# Patient Record
Sex: Male | Born: 1955 | Race: Black or African American | Hispanic: No | Marital: Single | Smoking: Current every day smoker
Health system: Southern US, Community
[De-identification: ages and names within clinical notes are randomized; demographics above are authoritative.]

## PROBLEM LIST (undated history)

## (undated) DIAGNOSIS — I10 Essential (primary) hypertension: Secondary | ICD-10-CM

## (undated) DIAGNOSIS — A0472 Enterocolitis due to Clostridium difficile, not specified as recurrent: Secondary | ICD-10-CM

## (undated) DIAGNOSIS — B009 Herpesviral infection, unspecified: Secondary | ICD-10-CM

## (undated) DIAGNOSIS — E119 Type 2 diabetes mellitus without complications: Secondary | ICD-10-CM

## (undated) DIAGNOSIS — D649 Anemia, unspecified: Secondary | ICD-10-CM

## (undated) DIAGNOSIS — E785 Hyperlipidemia, unspecified: Secondary | ICD-10-CM

## (undated) DIAGNOSIS — F209 Schizophrenia, unspecified: Secondary | ICD-10-CM

## (undated) DIAGNOSIS — K219 Gastro-esophageal reflux disease without esophagitis: Secondary | ICD-10-CM

## (undated) HISTORY — PX: CHOLECYSTECTOMY: SHX55

---

## 2005-04-21 ENCOUNTER — Emergency Department: Payer: Self-pay | Admitting: Emergency Medicine

## 2005-04-21 ENCOUNTER — Other Ambulatory Visit: Payer: Self-pay

## 2005-10-24 ENCOUNTER — Ambulatory Visit: Payer: Self-pay | Admitting: Cardiovascular Disease

## 2005-10-26 ENCOUNTER — Ambulatory Visit: Payer: Self-pay | Admitting: Cardiovascular Disease

## 2006-02-18 ENCOUNTER — Emergency Department: Payer: Self-pay | Admitting: Emergency Medicine

## 2006-04-09 ENCOUNTER — Emergency Department: Payer: Self-pay | Admitting: Emergency Medicine

## 2006-06-10 ENCOUNTER — Emergency Department: Payer: Self-pay

## 2006-07-12 ENCOUNTER — Emergency Department: Payer: Self-pay | Admitting: Unknown Physician Specialty

## 2007-08-05 ENCOUNTER — Emergency Department: Payer: Self-pay | Admitting: Emergency Medicine

## 2007-12-04 ENCOUNTER — Emergency Department: Payer: Self-pay | Admitting: Emergency Medicine

## 2008-01-03 ENCOUNTER — Inpatient Hospital Stay: Payer: Self-pay | Admitting: Psychiatry

## 2008-01-03 ENCOUNTER — Other Ambulatory Visit: Payer: Self-pay

## 2008-01-22 ENCOUNTER — Ambulatory Visit: Payer: Self-pay | Admitting: Internal Medicine

## 2008-01-22 DIAGNOSIS — K219 Gastro-esophageal reflux disease without esophagitis: Secondary | ICD-10-CM | POA: Insufficient documentation

## 2008-01-22 DIAGNOSIS — J42 Unspecified chronic bronchitis: Secondary | ICD-10-CM | POA: Insufficient documentation

## 2008-01-22 DIAGNOSIS — E785 Hyperlipidemia, unspecified: Secondary | ICD-10-CM

## 2008-01-22 DIAGNOSIS — I252 Old myocardial infarction: Secondary | ICD-10-CM | POA: Insufficient documentation

## 2008-01-22 DIAGNOSIS — F411 Generalized anxiety disorder: Secondary | ICD-10-CM | POA: Insufficient documentation

## 2008-01-22 DIAGNOSIS — F209 Schizophrenia, unspecified: Secondary | ICD-10-CM | POA: Insufficient documentation

## 2008-01-22 DIAGNOSIS — F329 Major depressive disorder, single episode, unspecified: Secondary | ICD-10-CM

## 2008-01-22 DIAGNOSIS — I10 Essential (primary) hypertension: Secondary | ICD-10-CM

## 2008-01-22 LAB — CONVERTED CEMR LAB: Blood Glucose, Fingerstick: 73

## 2008-02-04 ENCOUNTER — Encounter (INDEPENDENT_AMBULATORY_CARE_PROVIDER_SITE_OTHER): Payer: Self-pay | Admitting: Internal Medicine

## 2008-02-19 ENCOUNTER — Ambulatory Visit: Payer: Self-pay | Admitting: Internal Medicine

## 2008-02-19 ENCOUNTER — Telehealth (INDEPENDENT_AMBULATORY_CARE_PROVIDER_SITE_OTHER): Payer: Self-pay | Admitting: *Deleted

## 2008-02-19 LAB — CONVERTED CEMR LAB: Hgb A1c MFr Bld: 8.9 %

## 2008-03-17 ENCOUNTER — Ambulatory Visit: Payer: Self-pay | Admitting: Internal Medicine

## 2008-03-18 LAB — CONVERTED CEMR LAB
Albumin: 3.8 g/dL (ref 3.5–5.2)
Alkaline Phosphatase: 64 units/L (ref 39–117)
BUN: 22 mg/dL (ref 6–23)
Chloride: 96 meq/L (ref 96–112)
Cholesterol: 126 mg/dL (ref 0–200)
Creatinine, Ser: 1.23 mg/dL (ref 0.40–1.50)
Glucose, Bld: 389 mg/dL — ABNORMAL HIGH (ref 70–99)
LDL Cholesterol: 65 mg/dL (ref 0–99)
Potassium: 4.9 meq/L (ref 3.5–5.3)
Sodium: 130 meq/L — ABNORMAL LOW (ref 135–145)
Total Protein: 6.4 g/dL (ref 6.0–8.3)

## 2008-04-06 ENCOUNTER — Encounter (INDEPENDENT_AMBULATORY_CARE_PROVIDER_SITE_OTHER): Payer: Self-pay | Admitting: Internal Medicine

## 2008-04-14 ENCOUNTER — Encounter (INDEPENDENT_AMBULATORY_CARE_PROVIDER_SITE_OTHER): Payer: Self-pay | Admitting: Internal Medicine

## 2008-04-22 ENCOUNTER — Encounter (INDEPENDENT_AMBULATORY_CARE_PROVIDER_SITE_OTHER): Payer: Self-pay | Admitting: Internal Medicine

## 2008-04-23 ENCOUNTER — Telehealth (INDEPENDENT_AMBULATORY_CARE_PROVIDER_SITE_OTHER): Payer: Self-pay | Admitting: Internal Medicine

## 2008-04-27 ENCOUNTER — Ambulatory Visit: Payer: Self-pay | Admitting: Internal Medicine

## 2008-04-27 DIAGNOSIS — E162 Hypoglycemia, unspecified: Secondary | ICD-10-CM

## 2008-04-30 ENCOUNTER — Encounter (INDEPENDENT_AMBULATORY_CARE_PROVIDER_SITE_OTHER): Payer: Self-pay | Admitting: Internal Medicine

## 2008-05-01 ENCOUNTER — Telehealth (INDEPENDENT_AMBULATORY_CARE_PROVIDER_SITE_OTHER): Payer: Self-pay | Admitting: Internal Medicine

## 2008-05-01 ENCOUNTER — Emergency Department (HOSPITAL_COMMUNITY): Admission: EM | Admit: 2008-05-01 | Discharge: 2008-05-01 | Payer: Self-pay | Admitting: Emergency Medicine

## 2008-05-14 ENCOUNTER — Telehealth (INDEPENDENT_AMBULATORY_CARE_PROVIDER_SITE_OTHER): Payer: Self-pay | Admitting: *Deleted

## 2008-05-19 ENCOUNTER — Ambulatory Visit: Payer: Self-pay | Admitting: Internal Medicine

## 2008-05-19 DIAGNOSIS — E119 Type 2 diabetes mellitus without complications: Secondary | ICD-10-CM

## 2008-05-19 LAB — CONVERTED CEMR LAB
Blood Glucose, Fingerstick: 182
Hgb A1c MFr Bld: 6.8 %

## 2008-05-22 ENCOUNTER — Telehealth (INDEPENDENT_AMBULATORY_CARE_PROVIDER_SITE_OTHER): Payer: Self-pay | Admitting: Internal Medicine

## 2008-05-22 ENCOUNTER — Emergency Department (HOSPITAL_COMMUNITY): Admission: EM | Admit: 2008-05-22 | Discharge: 2008-05-22 | Payer: Self-pay | Admitting: Emergency Medicine

## 2008-05-26 ENCOUNTER — Ambulatory Visit: Payer: Self-pay | Admitting: Internal Medicine

## 2008-05-26 ENCOUNTER — Telehealth (INDEPENDENT_AMBULATORY_CARE_PROVIDER_SITE_OTHER): Payer: Self-pay | Admitting: *Deleted

## 2008-05-27 ENCOUNTER — Encounter (INDEPENDENT_AMBULATORY_CARE_PROVIDER_SITE_OTHER): Payer: Self-pay | Admitting: Internal Medicine

## 2008-06-03 ENCOUNTER — Encounter (INDEPENDENT_AMBULATORY_CARE_PROVIDER_SITE_OTHER): Payer: Self-pay | Admitting: Internal Medicine

## 2008-06-10 ENCOUNTER — Ambulatory Visit: Payer: Self-pay | Admitting: Internal Medicine

## 2008-06-10 DIAGNOSIS — I498 Other specified cardiac arrhythmias: Secondary | ICD-10-CM

## 2008-06-10 DIAGNOSIS — I951 Orthostatic hypotension: Secondary | ICD-10-CM

## 2008-06-10 LAB — CONVERTED CEMR LAB
Albumin: 3.1 g/dL — ABNORMAL LOW (ref 3.5–5.2)
Alkaline Phosphatase: 75 units/L (ref 39–117)
BUN: 21 mg/dL (ref 6–23)
Bilirubin Urine: NEGATIVE
Blood Glucose, Fingerstick: 221
CO2: 29 meq/L (ref 19–32)
Calcium: 9.2 mg/dL (ref 8.4–10.5)
Chloride: 102 meq/L (ref 96–112)
Creatinine, Ser: 1.76 mg/dL — ABNORMAL HIGH (ref 0.40–1.50)
Eosinophils Relative: 1 % (ref 0–5)
Glucose, Urine, Semiquant: NEGATIVE
HCT: 45 % (ref 39.0–52.0)
MCV: 94.5 fL (ref 78.0–100.0)
Neutro Abs: 8.3 10*3/uL — ABNORMAL HIGH (ref 1.7–7.7)
Platelets: 152 10*3/uL (ref 150–400)
Potassium: 5 meq/L (ref 3.5–5.3)
Protein, U semiquant: 30
RBC: 4.76 M/uL (ref 4.22–5.81)
RDW: 17 % — ABNORMAL HIGH (ref 11.5–15.5)
Sodium: 137 meq/L (ref 135–145)
Specific Gravity, Urine: 1.02
Total Protein: 5.8 g/dL — ABNORMAL LOW (ref 6.0–8.3)
WBC Urine, dipstick: NEGATIVE
pH: 6

## 2008-06-24 ENCOUNTER — Ambulatory Visit: Payer: Self-pay | Admitting: Internal Medicine

## 2008-07-08 ENCOUNTER — Ambulatory Visit: Payer: Self-pay | Admitting: Internal Medicine

## 2008-07-08 LAB — CONVERTED CEMR LAB: Blood Glucose, Fingerstick: 197

## 2008-07-22 ENCOUNTER — Ambulatory Visit: Payer: Self-pay | Admitting: Internal Medicine

## 2008-07-31 ENCOUNTER — Ambulatory Visit: Payer: Self-pay | Admitting: Internal Medicine

## 2008-08-04 ENCOUNTER — Telehealth (INDEPENDENT_AMBULATORY_CARE_PROVIDER_SITE_OTHER): Payer: Self-pay | Admitting: Internal Medicine

## 2008-08-04 ENCOUNTER — Emergency Department (HOSPITAL_COMMUNITY): Admission: EM | Admit: 2008-08-04 | Discharge: 2008-08-04 | Payer: Self-pay | Admitting: Emergency Medicine

## 2008-08-04 ENCOUNTER — Encounter (INDEPENDENT_AMBULATORY_CARE_PROVIDER_SITE_OTHER): Payer: Self-pay | Admitting: Internal Medicine

## 2008-08-12 ENCOUNTER — Ambulatory Visit: Payer: Self-pay | Admitting: Internal Medicine

## 2008-08-17 ENCOUNTER — Encounter (INDEPENDENT_AMBULATORY_CARE_PROVIDER_SITE_OTHER): Payer: Self-pay | Admitting: Internal Medicine

## 2008-08-18 ENCOUNTER — Ambulatory Visit: Payer: Self-pay | Admitting: Internal Medicine

## 2008-08-18 LAB — CONVERTED CEMR LAB
Blood Glucose, Fingerstick: 208
Hgb A1c MFr Bld: 6.9 %

## 2008-08-28 ENCOUNTER — Encounter (INDEPENDENT_AMBULATORY_CARE_PROVIDER_SITE_OTHER): Payer: Self-pay | Admitting: Internal Medicine

## 2008-08-31 ENCOUNTER — Encounter (INDEPENDENT_AMBULATORY_CARE_PROVIDER_SITE_OTHER): Payer: Self-pay | Admitting: Internal Medicine

## 2008-09-18 ENCOUNTER — Encounter (INDEPENDENT_AMBULATORY_CARE_PROVIDER_SITE_OTHER): Payer: Self-pay | Admitting: Internal Medicine

## 2008-09-22 ENCOUNTER — Encounter (INDEPENDENT_AMBULATORY_CARE_PROVIDER_SITE_OTHER): Payer: Self-pay | Admitting: Internal Medicine

## 2008-10-26 ENCOUNTER — Encounter (INDEPENDENT_AMBULATORY_CARE_PROVIDER_SITE_OTHER): Payer: Self-pay | Admitting: Internal Medicine

## 2008-11-03 ENCOUNTER — Encounter (INDEPENDENT_AMBULATORY_CARE_PROVIDER_SITE_OTHER): Payer: Self-pay | Admitting: Internal Medicine

## 2008-11-13 ENCOUNTER — Ambulatory Visit: Payer: Self-pay | Admitting: Internal Medicine

## 2008-11-14 ENCOUNTER — Encounter (INDEPENDENT_AMBULATORY_CARE_PROVIDER_SITE_OTHER): Payer: Self-pay | Admitting: Internal Medicine

## 2008-11-16 LAB — CONVERTED CEMR LAB
ALT: 31 units/L (ref 0–53)
Albumin: 3.7 g/dL (ref 3.5–5.2)
CO2: 24 meq/L (ref 19–32)
Calcium: 9.2 mg/dL (ref 8.4–10.5)
Cholesterol: 159 mg/dL (ref 0–200)
Creatinine, Ser: 1.01 mg/dL (ref 0.40–1.50)
HDL: 51 mg/dL (ref >39)
Sodium: 137 meq/L (ref 135–145)
Total Protein: 6.4 g/dL (ref 6.0–8.3)
VLDL: 19 mg/dL (ref 0–40)

## 2008-12-03 ENCOUNTER — Encounter (INDEPENDENT_AMBULATORY_CARE_PROVIDER_SITE_OTHER): Payer: Self-pay | Admitting: Internal Medicine

## 2008-12-09 ENCOUNTER — Encounter (INDEPENDENT_AMBULATORY_CARE_PROVIDER_SITE_OTHER): Payer: Self-pay | Admitting: Internal Medicine

## 2008-12-16 ENCOUNTER — Encounter (INDEPENDENT_AMBULATORY_CARE_PROVIDER_SITE_OTHER): Payer: Self-pay | Admitting: Internal Medicine

## 2008-12-23 ENCOUNTER — Encounter (INDEPENDENT_AMBULATORY_CARE_PROVIDER_SITE_OTHER): Payer: Self-pay | Admitting: Internal Medicine

## 2009-01-12 ENCOUNTER — Encounter (INDEPENDENT_AMBULATORY_CARE_PROVIDER_SITE_OTHER): Payer: Self-pay | Admitting: Internal Medicine

## 2009-01-19 ENCOUNTER — Telehealth (INDEPENDENT_AMBULATORY_CARE_PROVIDER_SITE_OTHER): Payer: Self-pay | Admitting: Internal Medicine

## 2009-01-20 ENCOUNTER — Encounter (INDEPENDENT_AMBULATORY_CARE_PROVIDER_SITE_OTHER): Payer: Self-pay | Admitting: Internal Medicine

## 2009-02-02 ENCOUNTER — Encounter (INDEPENDENT_AMBULATORY_CARE_PROVIDER_SITE_OTHER): Payer: Self-pay | Admitting: Internal Medicine

## 2009-02-04 ENCOUNTER — Telehealth (INDEPENDENT_AMBULATORY_CARE_PROVIDER_SITE_OTHER): Payer: Self-pay | Admitting: Internal Medicine

## 2009-03-17 ENCOUNTER — Encounter (INDEPENDENT_AMBULATORY_CARE_PROVIDER_SITE_OTHER): Payer: Self-pay | Admitting: Internal Medicine

## 2009-03-31 ENCOUNTER — Encounter (INDEPENDENT_AMBULATORY_CARE_PROVIDER_SITE_OTHER): Payer: Self-pay | Admitting: Internal Medicine

## 2009-05-26 ENCOUNTER — Encounter (INDEPENDENT_AMBULATORY_CARE_PROVIDER_SITE_OTHER): Payer: Self-pay | Admitting: Internal Medicine

## 2009-07-04 ENCOUNTER — Emergency Department: Payer: Self-pay | Admitting: Emergency Medicine

## 2010-01-07 ENCOUNTER — Encounter: Payer: Self-pay | Admitting: Family Medicine

## 2010-02-19 ENCOUNTER — Emergency Department: Payer: Self-pay | Admitting: Unknown Physician Specialty

## 2010-10-11 NOTE — Letter (Signed)
Summary: medical release  medical release   Imported By: Lind Guest 01/07/2010 14:05:56  _____________________________________________________________________  External Attachment:    Type:   Image     Comment:   External Document

## 2010-11-09 ENCOUNTER — Inpatient Hospital Stay: Payer: Self-pay | Admitting: Surgery

## 2010-11-14 LAB — PATHOLOGY REPORT

## 2011-06-13 LAB — URINALYSIS, ROUTINE W REFLEX MICROSCOPIC
Bilirubin Urine: NEGATIVE
Nitrite: NEGATIVE
Protein, ur: NEGATIVE
Specific Gravity, Urine: >1.03 — ABNORMAL HIGH
Urobilinogen, UA: 1
pH: 5.5

## 2011-06-13 LAB — BASIC METABOLIC PANEL
CO2: 30
Calcium: 9.1
GFR calc Af Amer: >60
GFR calc non Af Amer: >60

## 2011-06-13 LAB — DIFFERENTIAL
Basophils Relative: 0
Eosinophils Absolute: 0.1
Neutro Abs: 4.5

## 2011-06-13 LAB — CBC
Hemoglobin: 14.3
RBC: 4.58

## 2011-06-14 LAB — GLUCOSE, CAPILLARY
Glucose-Capillary: 117 — ABNORMAL HIGH
Glucose-Capillary: 87
Glucose-Capillary: 93

## 2011-08-02 ENCOUNTER — Emergency Department: Payer: Self-pay | Admitting: Emergency Medicine

## 2015-03-14 ENCOUNTER — Encounter: Payer: Self-pay | Admitting: Emergency Medicine

## 2015-03-14 ENCOUNTER — Emergency Department
Admission: EM | Admit: 2015-03-14 | Discharge: 2015-03-14 | Disposition: A | Discharge status: Home / Self Care | Payer: Medicare Other | Attending: Emergency Medicine | Admitting: Emergency Medicine

## 2015-03-14 DIAGNOSIS — Z0000 Encounter for general adult medical examination without abnormal findings: Secondary | ICD-10-CM | POA: Insufficient documentation

## 2015-03-14 DIAGNOSIS — I10 Essential (primary) hypertension: Secondary | ICD-10-CM | POA: Diagnosis not present

## 2015-03-14 DIAGNOSIS — Y9389 Activity, other specified: Secondary | ICD-10-CM | POA: Insufficient documentation

## 2015-03-14 DIAGNOSIS — E119 Type 2 diabetes mellitus without complications: Secondary | ICD-10-CM | POA: Insufficient documentation

## 2015-03-14 DIAGNOSIS — Z720 Tobacco use: Secondary | ICD-10-CM | POA: Diagnosis not present

## 2015-03-14 DIAGNOSIS — Y998 Other external cause status: Secondary | ICD-10-CM | POA: Diagnosis not present

## 2015-03-14 DIAGNOSIS — Y92410 Unspecified street and highway as the place of occurrence of the external cause: Secondary | ICD-10-CM | POA: Insufficient documentation

## 2015-03-14 DIAGNOSIS — Z041 Encounter for examination and observation following transport accident: Secondary | ICD-10-CM | POA: Diagnosis present

## 2015-03-14 HISTORY — DX: Type 2 diabetes mellitus without complications: E11.9

## 2015-03-14 NOTE — ED Notes (Signed)
Pt accompanied by AD at group home. 

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
## 2015-03-23 ENCOUNTER — Other Ambulatory Visit: Payer: Self-pay

## 2015-03-23 ENCOUNTER — Encounter: Payer: Self-pay | Admitting: *Deleted

## 2015-03-23 ENCOUNTER — Emergency Department: Payer: Medicare Other

## 2015-03-23 DIAGNOSIS — Z79899 Other long term (current) drug therapy: Secondary | ICD-10-CM | POA: Diagnosis not present

## 2015-03-23 DIAGNOSIS — R1030 Lower abdominal pain, unspecified: Secondary | ICD-10-CM | POA: Diagnosis not present

## 2015-03-23 DIAGNOSIS — Z7982 Long term (current) use of aspirin: Secondary | ICD-10-CM | POA: Diagnosis not present

## 2015-03-23 DIAGNOSIS — R109 Unspecified abdominal pain: Secondary | ICD-10-CM | POA: Diagnosis present

## 2015-03-23 DIAGNOSIS — I10 Essential (primary) hypertension: Secondary | ICD-10-CM | POA: Diagnosis not present

## 2015-03-23 DIAGNOSIS — E860 Dehydration: Secondary | ICD-10-CM | POA: Diagnosis not present

## 2015-03-23 DIAGNOSIS — R0600 Dyspnea, unspecified: Secondary | ICD-10-CM | POA: Diagnosis not present

## 2015-03-23 DIAGNOSIS — Z794 Long term (current) use of insulin: Secondary | ICD-10-CM | POA: Insufficient documentation

## 2015-03-23 DIAGNOSIS — E119 Type 2 diabetes mellitus without complications: Secondary | ICD-10-CM | POA: Diagnosis not present

## 2015-03-23 DIAGNOSIS — K5900 Constipation, unspecified: Secondary | ICD-10-CM | POA: Insufficient documentation

## 2015-03-23 DIAGNOSIS — Z720 Tobacco use: Secondary | ICD-10-CM | POA: Diagnosis not present

## 2015-03-23 LAB — CBC
HCT: 36.9 % — ABNORMAL LOW (ref 40.0–52.0)
Hemoglobin: 11.8 g/dL — ABNORMAL LOW (ref 13.0–18.0)
MCH: 28.9 pg (ref 26.0–34.0)
MCHC: 31.9 g/dL — ABNORMAL LOW (ref 32.0–36.0)
MCV: 90.4 fL (ref 80.0–100.0)
PLATELETS: 159 10*3/uL (ref 150–440)
RBC: 4.08 MIL/uL — ABNORMAL LOW (ref 4.40–5.90)
RDW: 14.2 % (ref 11.5–14.5)
WBC: 7.7 10*3/uL (ref 3.8–10.6)

## 2015-03-23 NOTE — ED Notes (Signed)
Pt unable to void at this time. 

## 2015-03-23 NOTE — ED Notes (Signed)
Pt is from a group home, brought in by a care giver.  Pt reports abd pain.  No bm today.  No vomiting.  Pt also reports sob.  No chest pain.  cig smoker.  Caregiver reports pt had difficulty walking today, like he was dizzy.  Pt sleepy in triage but easily aroused.

## 2015-03-24 ENCOUNTER — Emergency Department
Admission: EM | Admit: 2015-03-24 | Discharge: 2015-03-24 | Disposition: A | Discharge status: Home / Self Care | Payer: Medicare Other | Attending: Emergency Medicine | Admitting: Emergency Medicine

## 2015-03-24 ENCOUNTER — Encounter: Payer: Self-pay | Admitting: Emergency Medicine

## 2015-03-24 DIAGNOSIS — R1030 Lower abdominal pain, unspecified: Secondary | ICD-10-CM

## 2015-03-24 DIAGNOSIS — R0600 Dyspnea, unspecified: Secondary | ICD-10-CM

## 2015-03-24 DIAGNOSIS — E860 Dehydration: Secondary | ICD-10-CM

## 2015-03-24 HISTORY — DX: Herpesviral infection, unspecified: B00.9

## 2015-03-24 HISTORY — DX: Hyperlipidemia, unspecified: E78.5

## 2015-03-24 HISTORY — DX: Anemia, unspecified: D64.9

## 2015-03-24 HISTORY — DX: Gastro-esophageal reflux disease without esophagitis: K21.9

## 2015-03-24 HISTORY — DX: Schizophrenia, unspecified: F20.9

## 2015-03-24 LAB — URINALYSIS COMPLETE WITH MICROSCOPIC (ARMC ONLY)
BACTERIA UA: NONE SEEN
Bilirubin Urine: NEGATIVE
Glucose, UA: >500 mg/dL — AB
HGB URINE DIPSTICK: NEGATIVE
Leukocytes, UA: NEGATIVE
NITRITE: NEGATIVE
Protein, ur: NEGATIVE mg/dL
SQUAMOUS EPITHELIAL / LPF: NONE SEEN
Specific Gravity, Urine: 1.022 (ref 1.005–1.030)
pH: 5 (ref 5.0–8.0)

## 2015-03-24 LAB — COMPREHENSIVE METABOLIC PANEL
ALT: 8 U/L — AB (ref 17–63)
AST: 16 U/L (ref 15–41)
Albumin: 3.2 g/dL — ABNORMAL LOW (ref 3.5–5.0)
Alkaline Phosphatase: 71 U/L (ref 38–126)
Anion gap: 10 (ref 5–15)
BUN: 42 mg/dL — AB (ref 6–20)
CALCIUM: 9.5 mg/dL (ref 8.9–10.3)
CO2: 27 mmol/L (ref 22–32)
CREATININE: 1.9 mg/dL — AB (ref 0.61–1.24)
Chloride: 99 mmol/L — ABNORMAL LOW (ref 101–111)
GFR calc non Af Amer: 38 mL/min — ABNORMAL LOW (ref >60)
GFR, EST AFRICAN AMERICAN: 44 mL/min — AB (ref >60)
Glucose, Bld: 110 mg/dL — ABNORMAL HIGH (ref 65–99)
Potassium: 4.9 mmol/L (ref 3.5–5.1)
Sodium: 136 mmol/L (ref 135–145)
Total Bilirubin: 0.2 mg/dL — ABNORMAL LOW (ref 0.3–1.2)
Total Protein: 6.7 g/dL (ref 6.5–8.1)

## 2015-03-24 LAB — GLUCOSE, CAPILLARY: GLUCOSE-CAPILLARY: 110 mg/dL — AB (ref 65–99)

## 2015-03-24 LAB — TROPONIN I: Troponin I: <0.03 ng/mL (ref <0.031)

## 2015-03-24 MED ORDER — SODIUM CHLORIDE 0.9 % IV BOLUS (SEPSIS): 1000.0000 mL | Freq: Once | INTRAVENOUS | Status: AC

## 2015-03-24 MED ORDER — SODIUM CHLORIDE 0.9 % IV BOLUS (SEPSIS)
1000.0000 mL | Freq: Once | INTRAVENOUS | Status: AC
  Administered 2015-03-24: 1000 mL via INTRAVENOUS

## 2015-03-24 NOTE — Discharge Instructions (Signed)
Abdominal Pain Many things can cause abdominal pain. Usually, abdominal pain is not caused by a disease and will improve without treatment. It can often be observed and treated at home. Your health care provider will do a physical exam and possibly order blood tests and X-rays to help determine the seriousness of your pain. However, in many cases, more time must pass before a clear cause of the pain can be found. Before that point, your health care provider may not know if you need more testing or further treatment. HOME CARE INSTRUCTIONS  Monitor your abdominal pain for any changes. The following actions may help to alleviate any discomfort you are experiencing:  Only take over-the-counter or prescription medicines as directed by your health care provider.  Do not take laxatives unless directed to do so by your health care provider.  Try a clear liquid diet (broth, tea, or water) as directed by your health care provider. Slowly move to a bland diet as tolerated. SEEK MEDICAL CARE IF:  You have unexplained abdominal pain.  You have abdominal pain associated with nausea or diarrhea.  You have pain when you urinate or have a bowel movement.  You experience abdominal pain that wakes you in the night.  You have abdominal pain that is worsened or improved by eating food.  You have abdominal pain that is worsened with eating fatty foods.  You have a fever. SEEK IMMEDIATE MEDICAL CARE IF:   Your pain does not go away within 2 hours.  You keep throwing up (vomiting).  Your pain is felt only in portions of the abdomen, such as the right side or the left lower portion of the abdomen.  You pass bloody or black tarry stools. MAKE SURE YOU:  Understand these instructions.   Will watch your condition.   Will get help right away if you are not doing well or get worse.  Document Released: 06/07/2005 Document Revised: 09/02/2013 Document Reviewed: 05/07/2013 [facility] Patient Information  2015 [place], [place]. This information is not intended to replace advice given to you by your health care provider. Make sure you discuss any questions you have with your health care provider.  Dehydration, Adult Dehydration is when you lose more fluids from the body than you take in. Vital organs like the kidneys, brain, and heart cannot function without a proper amount of fluids and salt. Any loss of fluids from the body can cause dehydration.  CAUSES   Vomiting.  Diarrhea.  Excessive sweating.  Excessive urine output.  Fever. SYMPTOMS  Mild dehydration  Thirst.  Dry lips.  Slightly dry mouth. Moderate dehydration  Very dry mouth.  Sunken eyes.  Skin does not bounce back quickly when lightly pinched and released.  Dark urine and decreased urine production.  Decreased tear production.  Headache. Severe dehydration  Very dry mouth.  Extreme thirst.  Rapid, weak pulse (more than 100 beats per minute at rest).  Cold hands and feet.  Not able to sweat in spite of heat and temperature.  Rapid breathing.  Blue lips.  Confusion and lethargy.  Difficulty being awakened.  Minimal urine production.  No tears. DIAGNOSIS  Your caregiver will diagnose dehydration based on your symptoms and your exam. Blood and urine tests will help confirm the diagnosis. The diagnostic evaluation should also identify the cause of dehydration. TREATMENT  Treatment of mild or moderate dehydration can often be done at home by increasing the amount of fluids that you drink. It is best to drink small amounts of  fluid more often. Drinking too much at one time can make vomiting worse. Refer to the home care instructions below. Severe dehydration needs to be treated at the hospital where you will probably be given intravenous (IV) fluids that contain water and electrolytes. HOME CARE INSTRUCTIONS   Ask your caregiver about specific rehydration instructions.  Drink enough fluids to keep  your urine clear or pale yellow.  Drink small amounts frequently if you have nausea and vomiting.  Eat as you normally do.  Avoid:  Foods or drinks high in sugar.  Carbonated drinks.  Juice.  Extremely hot or cold fluids.  Drinks with caffeine.  Fatty, greasy foods.  Alcohol.  Tobacco.  Overeating.  Gelatin desserts.  Wash your hands well to avoid spreading bacteria and viruses.  Only take over-the-counter or prescription medicines for pain, discomfort, or fever as directed by your caregiver.  Ask your caregiver if you should continue all prescribed and over-the-counter medicines.  Keep all follow-up appointments with your caregiver. SEEK MEDICAL CARE IF:  You have abdominal pain and it increases or stays in one area (localizes).  You have a rash, stiff neck, or severe headache.  You are irritable, sleepy, or difficult to awaken.  You are weak, dizzy, or extremely thirsty. SEEK IMMEDIATE MEDICAL CARE IF:   You are unable to keep fluids down or you get worse despite treatment.  You have frequent episodes of vomiting or diarrhea.  You have blood or green matter (bile) in your vomit.  You have blood in your stool or your stool looks black and tarry.  You have not urinated in 6 to 8 hours, or you have only urinated a small amount of very dark urine.  You have a fever.  You faint. MAKE SURE YOU:   Understand these instructions.  Will watch your condition.  Will get help right away if you are not doing well or get worse. Document Released: 08/28/2005 Document Revised: 11/20/2011 Document Reviewed: 04/17/2011 [facility] Patient Information 2015 [place], [place]. This information is not intended to replace advice given to you by your health care provider. Make sure you discuss any questions you have with your health care provider.  Shortness of Breath Shortness of breath means you have trouble breathing. It could also mean that you have a medical problem.  You should get immediate medical care for shortness of breath. CAUSES   Not enough oxygen in the air such as with high altitudes or a smoke-filled room.  Certain lung diseases, infections, or problems.  Heart disease or conditions, such as angina or heart failure.  Low red blood cells (anemia).  Poor physical fitness, which can cause shortness of breath when you exercise.  Chest or back injuries or stiffness.  Being overweight.  Smoking.  Anxiety, which can make you feel like you are not getting enough air. DIAGNOSIS  Serious medical problems can often be found during your physical exam. Tests may also be done to determine why you are having shortness of breath. Tests may include:  Chest X-rays.  Lung function tests.  Blood tests.  An electrocardiogram (ECG).  An ambulatory electrocardiogram. An ambulatory ECG records your heartbeat patterns over a 24-hour period.  Exercise testing.  A transthoracic echocardiogram (TTE). During echocardiography, sound waves are used to evaluate how blood flows through your heart.  A transesophageal echocardiogram (TEE).  Imaging scans. Your health care provider may not be able to find a cause for your shortness of breath after your exam. In this case, it is  important to have a follow-up exam with your health care provider as directed.  TREATMENT  Treatment for shortness of breath depends on the cause of your symptoms and can vary greatly. HOME CARE INSTRUCTIONS   Do not smoke. Smoking is a common cause of shortness of breath. If you smoke, ask for help to quit.  Avoid being around chemicals or things that may bother your breathing, such as paint fumes and dust.  Rest as needed. Slowly resume your usual activities.  If medicines were prescribed, take them as directed for the full length of time directed. This includes oxygen and any inhaled medicines.  Keep all follow-up appointments as directed by your health care provider. SEEK  MEDICAL CARE IF:   Your condition does not improve in the time expected.  You have a hard time doing your normal activities even with rest.  You have any new symptoms. SEEK IMMEDIATE MEDICAL CARE IF:   Your shortness of breath gets worse.  You feel light-headed, faint, or develop a cough not controlled with medicines.  You start coughing up blood.  You have pain with breathing.  You have chest pain or pain in your arms, shoulders, or abdomen.  You have a fever.  You are unable to walk up stairs or exercise the way you normally do. MAKE SURE YOU:  Understand these instructions.  Will watch your condition.  Will get help right away if you are not doing well or get worse. Document Released: 05/23/2001 Document Revised: 09/02/2013 Document Reviewed: 11/13/2011 [facility] Patient Information 2015 [place], [place]. This information is not intended to replace advice given to you by your health care provider. Make sure you discuss any questions you have with your health care provider.

## 2015-03-24 NOTE — ED Notes (Signed)
CBG 110. 

## 2015-03-24 NOTE — ED Provider Notes (Signed)
[facility] Emergency Department Provider Note  ____________________________________________  Time seen: Approximately 1:01 AM  I have reviewed the triage vital signs and the nursing notes.   HISTORY  Chief Complaint Abdominal Pain and Shortness of Breath    HPI Johnny [name] is a 59 y.o. male with a history of schizophrenia who lives in a group home. The patient comes in tonight with the complaint of dizziness, stomach complaints and shortness of breath. The history is taken from the group home staff who is with the patient. The group home staff reports that they wanted to make sure nothing was wrong with his heart, urinary tract or respirations. They report that the patient was out on an outing all day and went to the mall. They report that he's eating well all day as well. They report that he is discussed having lower abdominal pain. According to the notes sent with the patient he was showing signs of being off balance and the symptoms seem to come and go all day. The patient tried to have a bowel movement 3 times and was unable to do so. The patient smokes 2 packs per day so the staff was concerned because of his shortness of breath complaint. The patient does have a history of COPD and per the staff she thinks he received his inhaler and is unsure if that helped with his breathing. The patient did receive all of his nighttime medications and currently is sleeping. He is complaining of no pain. He reports his abdomen does not hurt.   Past Medical History  Diagnosis Date  . DM (diabetes mellitus)   . Schizophrenia   . Hyperlipidemia   . GERD (gastroesophageal reflux disease)   . Anemia   . Herpes     Patient Active Problem List   Diagnosis Date Noted  . SINUS TACHYCARDIA 06/10/2008  . HYPOTENSION, ORTHOSTATIC 06/10/2008  . DIABETES MELLITUS, CONTROLLED, WITHOUT COMPLICATIONS 05/19/2008  . HYPOGLYCEMIA 04/27/2008  . HYPERLIPIDEMIA 01/22/2008  .  SCHIZOPHRENIA 01/22/2008  . ANXIETY 01/22/2008  . DEPRESSION 01/22/2008  . HYPERTENSION 01/22/2008  . MYOCARDIAL INFARCTION, HX OF 01/22/2008  . BRONCHITIS, CHRONIC 01/22/2008  . GERD 01/22/2008    Past Surgical History  Procedure Laterality Date  . Cholecystectomy      Current Outpatient Rx  Name  Route  Sig  Dispense  Refill  . albuterol (PROVENTIL HFA;VENTOLIN HFA) 108 (90 BASE) MCG/ACT inhaler   Inhalation   Inhale 2 puffs into the lungs every 4 (four) hours as needed for wheezing or shortness of breath.         Marland Kitchen ammonium lactate (AMLACTIN) 12 % cream   Topical   Apply topically daily.         Marland Kitchen aspirin 81 MG chewable tablet   Oral   Chew by mouth daily.         . benazepril-hydrochlorthiazide (LOTENSIN HCT) 20-12.5 MG per tablet   Oral   Take 1 tablet by mouth daily.         . benztropine (COGENTIN) 1 MG tablet   Oral   Take 1 mg by mouth 2 (two) times daily.         . Canagliflozin-Metformin HCl (INVOKAMET) 50-1000 MG TABS   Oral   Take 1 tablet by mouth 2 (two) times daily with a meal.         . clonazePAM (KLONOPIN) 1 MG tablet   Oral   Take 1 mg by mouth 3 (three) times daily.         Marland Kitchen  colesevelam (WELCHOL) 625 MG tablet   Oral   Take 1,875 mg by mouth 2 (two) times daily with a meal.         . divalproex (DEPAKOTE ER) 500 MG 24 hr tablet   Oral   Take by mouth 2 (two) times daily.         . fluPHENAZine (PROLIXIN) 5 MG tablet   Oral   Take 5 mg by mouth 2 (two) times daily.         . fluPHENAZine (PROLIXIN) 5 MG tablet   Oral   Take 5 mg by mouth 2 (two) times daily as needed.         . folic acid (FOLVITE) 1 MG tablet   Oral   Take 1 mg by mouth daily.         . insulin glargine (LANTUS) 100 UNIT/ML injection   Subcutaneous   Inject 30 Units into the skin at bedtime.         Marland Kitchen lubiprostone (AMITIZA) 24 MCG capsule   Oral   Take 24 mcg by mouth 2 (two) times daily with a meal.         . Memantine  HCl-Donepezil HCl (NAMZARIC) 28-10 MG CP24   Oral   Take 1 capsule by mouth daily.         Marland Kitchen omeprazole (PRILOSEC) 20 MG capsule   Oral   Take 20 mg by mouth daily.         . paliperidone (INVEGA SUSTENNA) 156 MG/ML SUSP injection   Intramuscular   Inject 156 mg into the muscle every 30 (thirty) days.         . pioglitazone (ACTOS) 45 MG tablet   Oral   Take 45 mg by mouth daily.         . polyethylene glycol (MIRALAX / GLYCOLAX) packet   Oral   Take 17 g by mouth daily.         . pravastatin (PRAVACHOL) 40 MG tablet   Oral   Take 40 mg by mouth daily.         . QUEtiapine (SEROQUEL) 300 MG tablet   Oral   Take 300 mg by mouth daily.         . QUEtiapine (SEROQUEL) 400 MG tablet   Oral   Take 600 mg by mouth at bedtime.         . sitaGLIPtin (JANUVIA) 100 MG tablet   Oral   Take 100 mg by mouth daily.         Marland Kitchen tiotropium (SPIRIVA) 18 MCG inhalation capsule   Inhalation   Place 18 mcg into inhaler and inhale daily.         . traZODone (DESYREL) 100 MG tablet   Oral   Take 200 mg by mouth at bedtime.         . Vitamin D, Ergocalciferol, (DRISDOL) 50000 UNITS CAPS capsule   Oral   Take 50,000 Units by mouth every 7 (seven) days.           Allergies Fish allergy and Shellfish allergy  History reviewed. No pertinent family history.  Social History History  Substance Use Topics  . Smoking status: Current Every Day Smoker -- 1.00 packs/day    Types: Cigarettes  . Smokeless tobacco: Never Used  . Alcohol Use: No    Review of Systems Constitutional: No fever/chills Eyes: No visual changes. ENT: No sore throat. Cardiovascular: Denies chest pain. Respiratory:  shortness of breath. Gastrointestinal:  abdominal pain and constipation with  No nausea, no vomiting.  No diarrhea.   Genitourinary: Negative for dysuria. Musculoskeletal: Negative for back pain. Skin: Negative for rash. Neurological: Dizziness  10-point ROS otherwise  negative.  ____________________________________________   PHYSICAL EXAM:  VITAL SIGNS: ED Triage Vitals  Enc Vitals Group     BP 03/23/15 2238 116/65 mmHg     Pulse Rate 03/23/15 2238 104     Resp 03/23/15 2238 20     Temp 03/23/15 2238 98.4 F (36.9 C)     Temp Source 03/23/15 2238 Oral     SpO2 03/23/15 2238 93 %     Weight 03/23/15 2255 170 lb (77.111 kg)     Height 03/23/15 2255 6' (1.829 m)     Head Cir --      Peak Flow --      Pain Score 03/23/15 2257 10     Pain Loc --      Pain Edu? --      Excl. in GC? --     Constitutional: Patient sleeping and very somnolent.  in no acute distress. Eyes: Conjunctivae are normal. PERRL. EOMI. Head: Atraumatic. Nose: No congestion/rhinnorhea. Mouth/Throat: Mucous membranes are moist.  Oropharynx non-erythematous. Cardiovascular: Normal rate, regular rhythm. Grossly normal heart sounds.  Good peripheral circulation. Respiratory: Normal respiratory effort.  No retractions. Lungs CTAB. Gastrointestinal: Soft and nontender. No distention. Positive bowel sounds Genitourinary: Deferred Musculoskeletal: No lower extremity tenderness nor edema.  No joint effusions. Neurologic:  Normal speech and language.  Skin:  Skin is warm, dry and intact. No rash noted. Psychiatric: Patient somnolent but arousable and affect normal  ____________________________________________   LABS (all labs ordered are listed, but only abnormal results are displayed)  Labs Reviewed  CBC - Abnormal; Notable for the following:    RBC 4.08 (*)    Hemoglobin 11.8 (*)    HCT 36.9 (*)    MCHC 31.9 (*)    All other components within normal limits  COMPREHENSIVE METABOLIC PANEL - Abnormal; Notable for the following:    Chloride 99 (*)    Glucose, Bld 110 (*)    BUN 42 (*)    Creatinine, Ser 1.90 (*)    Albumin 3.2 (*)    ALT 8 (*)    Total Bilirubin 0.2 (*)    GFR calc non Af Amer 38 (*)    GFR calc Af Amer 44 (*)    All other components within normal  limits  URINALYSIS COMPLETEWITH MICROSCOPIC ([facility] ONLY) - Abnormal; Notable for the following:    Color, Urine YELLOW (*)    APPearance CLEAR (*)    Glucose, UA >500 (*)    Ketones, ur TRACE (*)    All other components within normal limits  GLUCOSE, CAPILLARY - Abnormal; Notable for the following:    Glucose-Capillary 110 (*)    All other components within normal limits  TROPONIN I  CBG MONITORING, ED   ____________________________________________  EKG  ED ECG REPORT I, Rebecka ApleyWebster,  Margaruite Top P, the attending physician, personally viewed and interpreted this ECG.   Date: 03/24/2015  EKG Time: 2245  Rate: 97  Rhythm: normal EKG, normal sinus rhythm  Axis: normal  Intervals:none  ST&T Change: none  ____________________________________________  RADIOLOGY  Chest x-ray: Small right pleural effusion with atelectasis in the right lung base ____________________________________________   PROCEDURES  Procedure(s) performed: None  Critical Care performed: No  ____________________________________________   INITIAL IMPRESSION / ASSESSMENT AND PLAN / ED COURSE  Pertinent  labs & imaging results that were available during my care of the patient were reviewed by me and considered in my medical decision making (see chart for details).  This is a 59 year old male with a history of schizophrenia and COPD who comes in with multiple medical complaints today. The symptoms have come and gone all day and currently the patient is not having any complaints. The patient does have an elevated creatinines I will give him a liter of normal saline as we await the results of his urinalysis. Otherwise the patient has no further complaints at this time.  ----------------------------------------- 4:23 AM on 03/24/2015 -----------------------------------------  The patient did receive a liter of normal saline and he will be discharged to home to follow up with primary care physician. The patient does  not have any abdominal pain his fingerstick is unremarkable. He will be discharged to home and follow-up with his primary care physician as he has no complaints at this time. ____________________________________________   FINAL CLINICAL IMPRESSION(S) / ED DIAGNOSES  Final diagnoses:  Dyspnea  Dehydration  Lower abdominal pain      Rebecka Apley, MD 03/24/15 [id number]

## 2015-03-24 NOTE — ED Notes (Signed)
Pt reports abd pain and SOB x 2 days.  Pt denies n/v/d.  Pt reports he hasn't had a BM today, LBM yesterday.  Pt denies current SOB.  Pt reports pain to left flank.  Pt unable to localize abd pain.  Pt from group home, [facility], w/ caregiver.  Pt NAD at this time.

## 2015-06-29 ENCOUNTER — Emergency Department
Admission: EM | Admit: 2015-06-29 | Discharge: 2015-06-29 | Disposition: A | Discharge status: Home / Self Care | Payer: Medicare Other | Attending: Emergency Medicine | Admitting: Emergency Medicine

## 2015-06-29 ENCOUNTER — Encounter: Payer: Self-pay | Admitting: *Deleted

## 2015-06-29 ENCOUNTER — Emergency Department: Payer: Medicare Other

## 2015-06-29 DIAGNOSIS — E119 Type 2 diabetes mellitus without complications: Secondary | ICD-10-CM | POA: Diagnosis not present

## 2015-06-29 DIAGNOSIS — R1110 Vomiting, unspecified: Secondary | ICD-10-CM | POA: Diagnosis present

## 2015-06-29 DIAGNOSIS — Z720 Tobacco use: Secondary | ICD-10-CM | POA: Insufficient documentation

## 2015-06-29 DIAGNOSIS — I10 Essential (primary) hypertension: Secondary | ICD-10-CM | POA: Diagnosis not present

## 2015-06-29 DIAGNOSIS — K529 Noninfective gastroenteritis and colitis, unspecified: Secondary | ICD-10-CM | POA: Insufficient documentation

## 2015-06-29 HISTORY — DX: Type 2 diabetes mellitus without complications: E11.9

## 2015-06-29 HISTORY — DX: Essential (primary) hypertension: I10

## 2015-06-29 LAB — COMPREHENSIVE METABOLIC PANEL
ALK PHOS: 68 U/L (ref 38–126)
ALT: 16 U/L — ABNORMAL LOW (ref 17–63)
ANION GAP: 12 (ref 5–15)
AST: 19 U/L (ref 15–41)
Albumin: 3.9 g/dL (ref 3.5–5.0)
BUN: 19 mg/dL (ref 6–20)
CALCIUM: 9.7 mg/dL (ref 8.9–10.3)
CO2: 28 mmol/L (ref 22–32)
Chloride: 94 mmol/L — ABNORMAL LOW (ref 101–111)
Creatinine, Ser: 1.25 mg/dL — ABNORMAL HIGH (ref 0.61–1.24)
GFR calc non Af Amer: >60 mL/min (ref >60)
Glucose, Bld: 107 mg/dL — ABNORMAL HIGH (ref 65–99)
Potassium: 4.8 mmol/L (ref 3.5–5.1)
SODIUM: 134 mmol/L — AB (ref 135–145)
Total Bilirubin: 0.4 mg/dL (ref 0.3–1.2)
Total Protein: 6.9 g/dL (ref 6.5–8.1)

## 2015-06-29 LAB — CBC
HCT: 40 % (ref 40.0–52.0)
HEMOGLOBIN: 13.3 g/dL (ref 13.0–18.0)
MCH: 30 pg (ref 26.0–34.0)
MCHC: 33.1 g/dL (ref 32.0–36.0)
MCV: 90.7 fL (ref 80.0–100.0)
Platelets: 177 10*3/uL (ref 150–440)
RBC: 4.42 MIL/uL (ref 4.40–5.90)
RDW: 15.8 % — ABNORMAL HIGH (ref 11.5–14.5)
WBC: 7 10*3/uL (ref 3.8–10.6)

## 2015-06-29 LAB — TROPONIN I

## 2015-06-29 LAB — LIPASE, BLOOD: LIPASE: 157 U/L — AB (ref 22–51)

## 2015-06-29 MED ORDER — GI COCKTAIL ~~LOC~~
30.0000 mL | ORAL | Status: AC
  Administered 2015-06-29: 30 mL via ORAL
  Filled 2015-06-29: qty 30

## 2015-06-29 MED ORDER — ONDANSETRON 8 MG PO TBDP: 8.0000 mg | ORAL_TABLET | Freq: Three times a day (TID) | ORAL | Status: DC | PRN

## 2015-06-29 MED ORDER — ONDANSETRON HCL 4 MG/2ML IJ SOLN
4.0000 mg | Freq: Once | INTRAMUSCULAR | Status: AC
  Administered 2015-06-29: 4 mg via INTRAVENOUS
  Filled 2015-06-29: qty 2

## 2015-06-29 MED ORDER — RANITIDINE HCL 150 MG PO CAPS: 150.0000 mg | ORAL_CAPSULE | Freq: Two times a day (BID) | ORAL | Status: DC

## 2015-06-29 NOTE — ED Notes (Signed)
Pt unable to give urine sample at this time 

## 2015-06-29 NOTE — ED Provider Notes (Signed)
[facility] Emergency Department Provider Note  ____________________________________________  Time seen: 8:20 PM  I have reviewed the triage vital signs and the nursing notes.   HISTORY  Chief Complaint Emesis    HPI Johnny PesaJohnnie [name] is a 59 y.o. male is brought to the ED by his group home due to vomiting today. They ate lunch out at a restaurant, and afterward the patient had several episodes of vomiting at the group home and then again in the emergency department. After several episodes of vomiting he also reported some discomfort in his chest. He is also had loose bowel movements today. Fever. No shortness of breath or syncope. Patient denies any pain and currently states that he feels like he is okay and has no specific symptoms. Feels better after previous episodes of vomiting.    Surgical History Surgery Date Comments  Cholecystectomy    Medical History Medical History Date Comments  Insulin dependent diabetes mellitus    Hypertension    Depression    Onychomycosis    Social History Tobacco Use Types Packs/Day Years Used Date  Smoker, Current Status Unknown      Smokeless Tobacco: Never Used      Alcohol Use Drinks/Week oz/Week Comments  No        Past Medical History  Diagnosis Date  . Hypertension   . Diabetes mellitus without complication (HCC)      There are no active problems to display for this patient.    Past Surgical History  Procedure Laterality Date  . Cholecystectomy       Current Outpatient Rx  Name  Route  Sig  Dispense  Refill  . ondansetron (ZOFRAN ODT) 8 MG disintegrating tablet   Oral   Take 1 tablet (8 mg total) by mouth every 8 (eight) hours as needed for nausea or vomiting.   20 tablet   0   . ranitidine (ZANTAC) 150 MG capsule   Oral   Take 1 capsule (150 mg total) by mouth 2 (two) times daily.   28 capsule   0      Allergies Review of patient's allergies indicates no known  allergies.   No family history on file.  Social History Social History  Substance Use Topics  . Smoking status: Current Every Day Smoker  . Smokeless tobacco: None  . Alcohol Use: No    Review of Systems  Constitutional:   No fever or chills. No weight changes Eyes:   No blurry vision or double vision.  ENT:   No sore throat. Cardiovascular:   No chest pain. Respiratory:   No dyspnea or cough. Gastrointestinal:   Negative for abdominal pain, with positive vomiting and diarrhea.  No BRBPR or melena. Genitourinary:   Negative for dysuria, urinary retention, bloody urine, or difficulty urinating. Musculoskeletal:   Negative for back pain. No joint swelling or pain. Skin:   Negative for rash. Neurological:   Negative for headaches, focal weakness or numbness. Psychiatric:  No anxiety or depression.   Endocrine:  No hot/cold intolerance, changes in energy, or sleep difficulty.  10-point ROS otherwise negative.  ____________________________________________   PHYSICAL EXAM:  VITAL SIGNS: ED Triage Vitals  Enc Vitals Group     BP 06/29/15 2006 143/90 mmHg     Pulse Rate 06/29/15 2006 103     Resp 06/29/15 2006 18     Temp 06/29/15 2006 99.3 F (37.4 C)     Temp Source 06/29/15 2006 Oral     SpO2 06/29/15  2006 97 %     Weight 06/29/15 2006 160 lb (72.576 kg)     Height 06/29/15 2006  (1.753 m)     Head Cir --      Peak Flow --      Pain Score 06/29/15 2006 10     Pain Loc --      Pain Edu? --      Excl. in GC? --      Constitutional:   Alert and oriented. Well appearing and in no distress. Eyes:   No scleral icterus. No conjunctival pallor. PERRL. EOMI ENT   Head:   Normocephalic and atraumatic.   Nose:   No congestion/rhinnorhea. No septal hematoma   Mouth/Throat:   MMM, mild pharyngeal erythema. No peritonsillar mass. No uvula shift.   Neck:   No stridor. No SubQ emphysema. No meningismus. Hematological/Lymphatic/Immunilogical:   No cervical  lymphadenopathy. Cardiovascular:   RRR. Normal and symmetric distal pulses are present in all extremities. No murmurs, rubs, or gallops. Respiratory:   Normal respiratory effort without tachypnea nor retractions. Breath sounds are clear and equal bilaterally. No wheezes/rales/rhonchi. Gastrointestinal:   Soft and nontender. No distention. There is no CVA tenderness.  No rebound, rigidity, or guarding. Genitourinary:   deferred Musculoskeletal:   Nontender with normal range of motion in all extremities. No joint effusions.  No lower extremity tenderness.  No edema. Neurologic:   Normal speech and language.  CN 2-10 normal. Motor grossly intact. No pronator drift.  Normal gait. No gross focal neurologic deficits are appreciated.  Skin:    Skin is warm, dry and intact. No rash noted.  No petechiae, purpura, or bullae. Psychiatric:   Mood and affect are normal. Speech and behavior are normal. Patient exhibits appropriate insight and judgment.  ____________________________________________    LABS (pertinent positives/negatives) (all labs ordered are listed, but only abnormal results are displayed) Labs Reviewed  LIPASE, BLOOD - Abnormal; Notable for the following:    Lipase 157 (*)    All other components within normal limits  COMPREHENSIVE METABOLIC PANEL - Abnormal; Notable for the following:    Sodium 134 (*)    Chloride 94 (*)    Glucose, Bld 107 (*)    Creatinine, Ser 1.25 (*)    ALT 16 (*)    All other components within normal limits  CBC - Abnormal; Notable for the following:    RDW 15.8 (*)    All other components within normal limits  TROPONIN I   ____________________________________________   EKG  Interpreted by me  Date: 06/29/2015  Rate: 86  Rhythm: normal sinus rhythm  QRS Axis: normal  Intervals: normal  ST/T Wave abnormalities: normal  Conduction Disutrbances: none  Narrative Interpretation:  unremarkable      ____________________________________________    RADIOLOGY  Chest x-ray unremarkable  ____________________________________________   PROCEDURES   ____________________________________________   INITIAL IMPRESSION / ASSESSMENT AND PLAN / ED COURSE  Pertinent labs & imaging results that were available during my care of the patient were reviewed by me and considered in my medical decision making (see chart for details).  Patient presents with vomiting and diarrhea but overall appears very comfortable and has a reassuring exam and vital signs. Labs unremarkable except for mildly elevated lipase. However, the patient has absolutely no abdominal tenderness, is calm and comfortable, and is tolerating oral intake. Suspect this is either a foodborne illness or some other viral illness that he is acquired from other members of the community. We treated  him symptomatically with Zantac and Zofran and have him follow-up with primary care.     ____________________________________________   FINAL CLINICAL IMPRESSION(S) / ED DIAGNOSES  Final diagnoses:  Gastroenteritis      Sharman Cheek, MD 06/29/15 2206

## 2015-06-29 NOTE — ED Notes (Signed)
Pt brought here by group home staff, awaiting call back with pt history and allergies at this time

## 2015-06-29 NOTE — ED Notes (Signed)
Pt reports on and off chest pain for several weeks. Pt started vomiting today, actively vomiting in lobby.

## 2015-06-30 ENCOUNTER — Encounter: Payer: Self-pay | Admitting: Emergency Medicine

## 2015-07-02 ENCOUNTER — Emergency Department
Admission: EM | Admit: 2015-07-02 | Discharge: 2015-07-02 | Disposition: A | Discharge status: Home / Self Care | Payer: Medicare Other | Source: Home / Self Care | Attending: Emergency Medicine | Admitting: Emergency Medicine

## 2015-07-02 ENCOUNTER — Encounter: Payer: Self-pay | Admitting: Emergency Medicine

## 2015-07-02 DIAGNOSIS — I959 Hypotension, unspecified: Secondary | ICD-10-CM | POA: Diagnosis not present

## 2015-07-02 DIAGNOSIS — F32A Depression, unspecified: Secondary | ICD-10-CM

## 2015-07-02 DIAGNOSIS — N179 Acute kidney failure, unspecified: Secondary | ICD-10-CM | POA: Diagnosis not present

## 2015-07-02 DIAGNOSIS — F329 Major depressive disorder, single episode, unspecified: Secondary | ICD-10-CM

## 2015-07-02 DIAGNOSIS — R109 Unspecified abdominal pain: Secondary | ICD-10-CM

## 2015-07-02 LAB — URINALYSIS COMPLETE WITH MICROSCOPIC (ARMC ONLY)
BILIRUBIN URINE: NEGATIVE
Bacteria, UA: NONE SEEN
Hgb urine dipstick: NEGATIVE
Ketones, ur: NEGATIVE mg/dL
Leukocytes, UA: NEGATIVE
NITRITE: NEGATIVE
PH: 5 (ref 5.0–8.0)
Protein, ur: NEGATIVE mg/dL
Specific Gravity, Urine: 1.029 (ref 1.005–1.030)
Squamous Epithelial / LPF: NONE SEEN

## 2015-07-02 LAB — COMPREHENSIVE METABOLIC PANEL
ALK PHOS: 66 U/L (ref 38–126)
ALT: 16 U/L — ABNORMAL LOW (ref 17–63)
ANION GAP: 10 (ref 5–15)
AST: 19 U/L (ref 15–41)
Albumin: 3.6 g/dL (ref 3.5–5.0)
BUN: 26 mg/dL — ABNORMAL HIGH (ref 6–20)
CO2: 27 mmol/L (ref 22–32)
CREATININE: 1.3 mg/dL — AB (ref 0.61–1.24)
Calcium: 10.1 mg/dL (ref 8.9–10.3)
Chloride: 96 mmol/L — ABNORMAL LOW (ref 101–111)
GFR calc non Af Amer: 59 mL/min — ABNORMAL LOW (ref >60)
Glucose, Bld: 152 mg/dL — ABNORMAL HIGH (ref 65–99)
Potassium: 4.2 mmol/L (ref 3.5–5.1)
Sodium: 133 mmol/L — ABNORMAL LOW (ref 135–145)
TOTAL PROTEIN: 7.1 g/dL (ref 6.5–8.1)
Total Bilirubin: 0.2 mg/dL — ABNORMAL LOW (ref 0.3–1.2)

## 2015-07-02 LAB — CBC
HCT: 41 % (ref 40.0–52.0)
HEMOGLOBIN: 13.3 g/dL (ref 13.0–18.0)
MCH: 29.3 pg (ref 26.0–34.0)
MCHC: 32.5 g/dL (ref 32.0–36.0)
MCV: 90.1 fL (ref 80.0–100.0)
PLATELETS: 223 10*3/uL (ref 150–440)
RBC: 4.55 MIL/uL (ref 4.40–5.90)
RDW: 15.6 % — ABNORMAL HIGH (ref 11.5–14.5)
WBC: 10.1 10*3/uL (ref 3.8–10.6)

## 2015-07-02 LAB — LIPASE, BLOOD: Lipase: 34 U/L (ref 11–51)

## 2015-07-02 NOTE — ED Provider Notes (Signed)
[facility] Emergency Department Provider Note    ____________________________________________  Time seen: 1535  I have reviewed the triage vital signs and the nursing notes.   HISTORY  Chief Complaint Abdominal Pain   History limited by: Not Limited   HPI Johnny [name] is a 59 y.o. male who presents to the emergency department today with complaints of continued abdominal discomfort. He states that this is been going on for roughly 1 week. He states that he has been having some abdominal pain in the left upper quadrant. He states that it is intermittent. It seems to be worse in the mornings. He has not noticed anything that he can do to either illicit or relieve the pain. He states he has had some nausea with some emesis of whatever he has eaten or drunk. He denies any fevers. He states that he has been sad recently after the death of one of his brothers. He denies any SI. He has not spoken anybody about his feelings at this point. Group home staff member states he does have an appointment on Monday with a therapist.   Past Medical History  Diagnosis Date  . DM (diabetes mellitus) (HCC)   . Schizophrenia (HCC)   . Hyperlipidemia   . GERD (gastroesophageal reflux disease)   . Anemia   . Herpes   . Hypertension   . Diabetes mellitus without complication [facility])     Patient Active Problem List   Diagnosis Date Noted  . SINUS TACHYCARDIA 06/10/2008  . HYPOTENSION, ORTHOSTATIC 06/10/2008  . DIABETES MELLITUS, CONTROLLED, WITHOUT COMPLICATIONS 05/19/2008  . HYPOGLYCEMIA 04/27/2008  . HYPERLIPIDEMIA 01/22/2008  . SCHIZOPHRENIA 01/22/2008  . ANXIETY 01/22/2008  . DEPRESSION 01/22/2008  . HYPERTENSION 01/22/2008  . MYOCARDIAL INFARCTION, HX OF 01/22/2008  . BRONCHITIS, CHRONIC 01/22/2008  . GERD 01/22/2008    Past Surgical History  Procedure Laterality Date  . Cholecystectomy      Current Outpatient Rx  Name  Route  Sig  Dispense  Refill  .  albuterol (PROVENTIL HFA;VENTOLIN HFA) 108 (90 BASE) MCG/ACT inhaler   Inhalation   Inhale 2 puffs into the lungs every 4 (four) hours as needed for wheezing or shortness of breath.         Marland Kitchen ammonium lactate (AMLACTIN) 12 % cream   Topical   Apply topically daily.         Marland Kitchen aspirin 81 MG chewable tablet   Oral   Chew by mouth daily.         . benazepril-hydrochlorthiazide (LOTENSIN HCT) 20-12.5 MG per tablet   Oral   Take 1 tablet by mouth daily.         . benztropine (COGENTIN) 1 MG tablet   Oral   Take 1 mg by mouth 2 (two) times daily.         . Canagliflozin-Metformin HCl (INVOKAMET) 50-1000 MG TABS   Oral   Take 1 tablet by mouth 2 (two) times daily with a meal.         . clonazePAM (KLONOPIN) 1 MG tablet   Oral   Take 1 mg by mouth 3 (three) times daily.         . colesevelam (WELCHOL) 625 MG tablet   Oral   Take 1,875 mg by mouth 2 (two) times daily with a meal.         . divalproex (DEPAKOTE ER) 500 MG 24 hr tablet   Oral   Take by mouth 2 (two) times daily.         Marland Kitchen  fluPHENAZine (PROLIXIN) 5 MG tablet   Oral   Take 5 mg by mouth 2 (two) times daily.         . fluPHENAZine (PROLIXIN) 5 MG tablet   Oral   Take 5 mg by mouth 2 (two) times daily as needed.         . folic acid (FOLVITE) 1 MG tablet   Oral   Take 1 mg by mouth daily.         . insulin glargine (LANTUS) 100 UNIT/ML injection   Subcutaneous   Inject 30 Units into the skin at bedtime.         Marland Kitchen lubiprostone (AMITIZA) 24 MCG capsule   Oral   Take 24 mcg by mouth 2 (two) times daily with a meal.         . Memantine HCl-Donepezil HCl (NAMZARIC) 28-10 MG CP24   Oral   Take 1 capsule by mouth daily.         Marland Kitchen omeprazole (PRILOSEC) 20 MG capsule   Oral   Take 20 mg by mouth daily.         . ondansetron (ZOFRAN ODT) 8 MG disintegrating tablet   Oral   Take 1 tablet (8 mg total) by mouth every 8 (eight) hours as needed for nausea or vomiting.   20 tablet    0   . paliperidone (INVEGA SUSTENNA) 156 MG/ML SUSP injection   Intramuscular   Inject 156 mg into the muscle every 30 (thirty) days.         . pioglitazone (ACTOS) 45 MG tablet   Oral   Take 45 mg by mouth daily.         . polyethylene glycol (MIRALAX / GLYCOLAX) packet   Oral   Take 17 g by mouth daily.         . pravastatin (PRAVACHOL) 40 MG tablet   Oral   Take 40 mg by mouth daily.         . QUEtiapine (SEROQUEL) 300 MG tablet   Oral   Take 300 mg by mouth daily.         . QUEtiapine (SEROQUEL) 400 MG tablet   Oral   Take 600 mg by mouth at bedtime.         . ranitidine (ZANTAC) 150 MG capsule   Oral   Take 1 capsule (150 mg total) by mouth 2 (two) times daily.   28 capsule   0   . sitaGLIPtin (JANUVIA) 100 MG tablet   Oral   Take 100 mg by mouth daily.         Marland Kitchen tiotropium (SPIRIVA) 18 MCG inhalation capsule   Inhalation   Place 18 mcg into inhaler and inhale daily.         . traZODone (DESYREL) 100 MG tablet   Oral   Take 200 mg by mouth at bedtime.         . Vitamin D, Ergocalciferol, (DRISDOL) 50000 UNITS CAPS capsule   Oral   Take 50,000 Units by mouth every 7 (seven) days.           Allergies Fish allergy and Shellfish allergy  No family history on file.  Social History Social History  Substance Use Topics  . Smoking status: Current Every Day Smoker -- 1.00 packs/day    Types: Cigarettes  . Smokeless tobacco: None  . Alcohol Use: No    Review of Systems  Constitutional: Negative for fever. Cardiovascular: Negative for chest pain.  Respiratory: Negative for shortness of breath. Gastrointestinal: Positive for left upper quadrant abdominal pain Genitourinary: Negative for dysuria. Musculoskeletal: Negative for back pain. Skin: Negative for rash. Neurological: Negative for headaches, focal weakness or numbness.  10-point ROS otherwise negative.  ____________________________________________   PHYSICAL  EXAM:  VITAL SIGNS: ED Triage Vitals  Enc Vitals Group     BP 07/02/15 1334 114/74 mmHg     Pulse Rate 07/02/15 1334 112     Resp 07/02/15 1334 18     Temp 07/02/15 1334 98.6 F (37 C)     Temp Source 07/02/15 1334 Oral     SpO2 07/02/15 1334 94 %     Weight 07/02/15 1334 180 lb (81.647 kg)     Height 07/02/15 1334 5\' 9"  (1.753 m)     Head Cir --      Peak Flow --      Pain Score 07/02/15 1335 6   Constitutional: Alert and oriented. Well appearing and in no distress. Eyes: Conjunctivae are normal. PERRL. Normal extraocular movements. ENT   Head: Normocephalic and atraumatic.   Nose: No congestion/rhinnorhea.   Mouth/Throat: Mucous membranes are moist.   Neck: No stridor. Hematological/Lymphatic/Immunilogical: No cervical lymphadenopathy. Cardiovascular: Normal rate, regular rhythm.  No murmurs, rubs, or gallops. Respiratory: Normal respiratory effort without tachypnea nor retractions. Breath sounds are clear and equal bilaterally. No wheezes/rales/rhonchi. Gastrointestinal: Soft and nontender. No distention.  Genitourinary: Deferred Musculoskeletal: Normal range of motion in all extremities. No joint effusions.  No lower extremity tenderness nor edema. Neurologic:  Normal speech and language. No gross focal neurologic deficits are appreciated. Speech is normal.  Skin:  Skin is warm, dry and intact. No rash noted. Psychiatric: Mood and affect are normal. Speech and behavior are normal. Patient exhibits appropriate insight and judgment.  ____________________________________________    LABS (pertinent positives/negatives)  None  ____________________________________________   EKG  None  ____________________________________________    RADIOLOGY  None   ____________________________________________   PROCEDURES  Procedure(s) performed: None  Critical Care performed: No  ____________________________________________   INITIAL IMPRESSION /  ASSESSMENT AND PLAN / ED COURSE  Pertinent labs & imaging results that were available during my care of the patient were reviewed by me and considered in my medical decision making (see chart for details).  Patient presented to the emergency department because of concerns for continued abdominal pain. Blood work today looks fine. In discussion with the patient I do think he is likely depressed. He did lose his brother recently. I guess is that this has a lot to do with his decreased appetite. His physical exam is benign without any abdominal tenderness. Did discuss and encourage patient to follow up with therapy.  ____________________________________________   FINAL CLINICAL IMPRESSION(S) / ED DIAGNOSES  Final diagnoses:  Depression  Abdominal pain, unspecified abdominal location     Phineas SemenGraydon Iolanda Folson, MD 07/02/15 1548

## 2015-07-02 NOTE — Discharge Instructions (Signed)
Please seek medical attention for any high fevers, chest pain, shortness of breath, change in behavior, persistent vomiting, bloody stool or any other new or concerning symptoms.  Complicated Grieving Grief is a normal response to the death of someone close to you. Feelings of fear, anger, and guilt can affect almost everyone who loses a loved one. It is also common to have symptoms of depression while you are grieving. These include problems with sleep, loss of appetite, and lack of energy. They may last for weeks or months after a loss. Complicated grief is different from normal grief or depression. Normal grieving involves sadness and feelings of loss, but these feelings are not constant. Complicated grief is a constant and severe type of grief. It interferes with your ability to function normally. It may last for several months to a year or longer. Complicated grief may require treatment from a mental health care provider. CAUSES  It is not known why some people continue to struggle with grief and others do not. You may be at higher risk for complicated grief if:  The death of your loved one was sudden or unexpected.  The death of your loved one was due to a violent event.  Your loved one committed suicide.  Your loved one was a child or a young person.  You were very close to or dependent on the loved one.  You have a history of depression. SIGNS AND SYMPTOMS Signs and symptoms of complicated grief may include:  Feeling disbelief or numbness.  Being unable to enjoy good memories of your loved one.  Needing to avoid anything that reminds you of your loved one.  Being unable to stop thinking about the death.  Feeling intense anger or guilt.  Feeling alone and hopeless.  Feeling that your life is meaningless and empty.  Losing the desire to live. DIAGNOSIS Your health care provider may diagnose complicated grief if:  You have constant symptoms of grief for 6-12 months or  longer.  Your symptoms are interfering with your ability to live your life. Your health care provider may want you to see a mental health care provider. Many symptoms of depression are similar to the symptoms of complicated grief. It is important to be evaluated for complicated grief along with other mental health conditions. TREATMENT  Talk therapy with a mental health provider is the most common treatment for complicated grief. During therapy, you will learn healthy ways to cope with the loss of your loved one. In some cases, your mental health care provider may also recommend antidepressant medicines. HOME CARE INSTRUCTIONS  Take care of yourself.  Eat regular meals and maintain a healthy diet. Eat plenty of fruits, vegetables, and whole grains.  Try to get some exercise each day.  Keep regular hours for sleep. Try to get at least 8 hours of sleep each night.  Do not use drugs or alcohol to ease your symptoms.  Take medicines only as directed by your health care provider.  Spend time with friends and loved ones.  Consider joining a grief (bereavement) support group to help you deal with your loss.  Keep all follow-up visits as directed by your health care provider. This is important. SEEK MEDICAL CARE IF:  Your symptoms keep you from functioning normally.  Your symptoms do not get better with treatment. SEEK IMMEDIATE MEDICAL CARE IF:  You have serious thoughts of hurting yourself or someone else.  You have suicidal feelings.   This information is not intended to  replace advice given to you by your health care provider. Make sure you discuss any questions you have with your health care provider.   Document Released: 08/28/2005 Document Revised: 05/19/2015 Document Reviewed: 02/05/2014 Elsevier Interactive Patient Education [employer]2016 [employer]

## 2015-07-02 NOTE — ED Notes (Signed)
Per caregiver, patient reports he has been vomiting. He has not had an appetite since he was here on 10/18. Denies diarrhea or fever.  He has also been taking medications as prescribed as well.

## 2015-07-02 NOTE — ED Notes (Addendum)
No tenderness to abdomen on palpation.  Tenderness noted to left back above flank.  Caregiver says he has not been eating as normal as he usually does and reports food has a funny taste.  Denies any burning or pain with urinating.  Conflicting reports if patient is vomiting or not.

## 2015-07-02 NOTE — ED Notes (Signed)
Pt with left side abd pain and decreased appetite for one week.

## 2015-07-05 ENCOUNTER — Emergency Department: Payer: Medicare Other

## 2015-07-05 ENCOUNTER — Inpatient Hospital Stay
Admission: EM | Admit: 2015-07-05 | Discharge: 2015-07-06 | DRG: 684 | Disposition: A | Discharge status: Home / Self Care | Payer: Medicare Other | Attending: Specialist | Admitting: Specialist

## 2015-07-05 DIAGNOSIS — Z7984 Long term (current) use of oral hypoglycemic drugs: Secondary | ICD-10-CM

## 2015-07-05 DIAGNOSIS — F329 Major depressive disorder, single episode, unspecified: Secondary | ICD-10-CM | POA: Diagnosis present

## 2015-07-05 DIAGNOSIS — Z79899 Other long term (current) drug therapy: Secondary | ICD-10-CM | POA: Diagnosis not present

## 2015-07-05 DIAGNOSIS — E119 Type 2 diabetes mellitus without complications: Secondary | ICD-10-CM | POA: Diagnosis present

## 2015-07-05 DIAGNOSIS — F17210 Nicotine dependence, cigarettes, uncomplicated: Secondary | ICD-10-CM | POA: Diagnosis present

## 2015-07-05 DIAGNOSIS — K219 Gastro-esophageal reflux disease without esophagitis: Secondary | ICD-10-CM | POA: Diagnosis present

## 2015-07-05 DIAGNOSIS — I10 Essential (primary) hypertension: Secondary | ICD-10-CM | POA: Diagnosis present

## 2015-07-05 DIAGNOSIS — N179 Acute kidney failure, unspecified: Principal | ICD-10-CM | POA: Diagnosis present

## 2015-07-05 DIAGNOSIS — Z794 Long term (current) use of insulin: Secondary | ICD-10-CM | POA: Diagnosis not present

## 2015-07-05 DIAGNOSIS — F209 Schizophrenia, unspecified: Secondary | ICD-10-CM | POA: Diagnosis present

## 2015-07-05 DIAGNOSIS — Z7982 Long term (current) use of aspirin: Secondary | ICD-10-CM

## 2015-07-05 DIAGNOSIS — J449 Chronic obstructive pulmonary disease, unspecified: Secondary | ICD-10-CM | POA: Diagnosis present

## 2015-07-05 DIAGNOSIS — I959 Hypotension, unspecified: Secondary | ICD-10-CM | POA: Diagnosis present

## 2015-07-05 DIAGNOSIS — E785 Hyperlipidemia, unspecified: Secondary | ICD-10-CM | POA: Diagnosis present

## 2015-07-05 DIAGNOSIS — E860 Dehydration: Secondary | ICD-10-CM | POA: Diagnosis present

## 2015-07-05 HISTORY — DX: Enterocolitis due to Clostridium difficile, not specified as recurrent: A04.72

## 2015-07-05 LAB — CBC
HEMATOCRIT: 36.5 % — AB (ref 40.0–52.0)
HEMOGLOBIN: 12.2 g/dL — AB (ref 13.0–18.0)
MCH: 30.4 pg (ref 26.0–34.0)
MCHC: 33.4 g/dL (ref 32.0–36.0)
MCV: 91 fL (ref 80.0–100.0)
Platelets: 208 10*3/uL (ref 150–440)
RBC: 4.01 MIL/uL — AB (ref 4.40–5.90)
RDW: 15.4 % — ABNORMAL HIGH (ref 11.5–14.5)
WBC: 9.8 10*3/uL (ref 3.8–10.6)

## 2015-07-05 LAB — BASIC METABOLIC PANEL
Anion gap: 13 (ref 5–15)
BUN: 35 mg/dL — AB (ref 6–20)
CALCIUM: 9.6 mg/dL (ref 8.9–10.3)
CO2: 26 mmol/L (ref 22–32)
CREATININE: 2.39 mg/dL — AB (ref 0.61–1.24)
Chloride: 91 mmol/L — ABNORMAL LOW (ref 101–111)
GFR calc Af Amer: 33 mL/min — ABNORMAL LOW (ref >60)
GFR, EST NON AFRICAN AMERICAN: 28 mL/min — AB (ref >60)
GLUCOSE: 160 mg/dL — AB (ref 65–99)
Potassium: 4.1 mmol/L (ref 3.5–5.1)
Sodium: 130 mmol/L — ABNORMAL LOW (ref 135–145)

## 2015-07-05 LAB — CBC WITH DIFFERENTIAL/PLATELET
Basophils Absolute: 0.1 10*3/uL (ref 0–0.1)
Basophils Relative: 1 %
EOS PCT: 0 %
Eosinophils Absolute: 0 10*3/uL (ref 0–0.7)
HEMATOCRIT: 40.3 % (ref 40.0–52.0)
Hemoglobin: 13 g/dL (ref 13.0–18.0)
LYMPHS PCT: 21 %
Lymphs Abs: 2.5 10*3/uL (ref 1.0–3.6)
MCH: 29.3 pg (ref 26.0–34.0)
MCHC: 32.4 g/dL (ref 32.0–36.0)
MCV: 90.6 fL (ref 80.0–100.0)
MONO ABS: 1.4 10*3/uL — AB (ref 0.2–1.0)
MONOS PCT: 12 %
NEUTROS ABS: 8.2 10*3/uL — AB (ref 1.4–6.5)
Neutrophils Relative %: 66 %
PLATELETS: 243 10*3/uL (ref 150–440)
RBC: 4.45 MIL/uL (ref 4.40–5.90)
RDW: 15.5 % — AB (ref 11.5–14.5)
WBC: 12.2 10*3/uL — ABNORMAL HIGH (ref 3.8–10.6)

## 2015-07-05 LAB — CREATININE, SERUM
Creatinine, Ser: 1.94 mg/dL — ABNORMAL HIGH (ref 0.61–1.24)
GFR calc Af Amer: 42 mL/min — ABNORMAL LOW (ref >60)
GFR calc non Af Amer: 37 mL/min — ABNORMAL LOW (ref >60)

## 2015-07-05 LAB — GLUCOSE, CAPILLARY
GLUCOSE-CAPILLARY: 120 mg/dL — AB (ref 65–99)
Glucose-Capillary: 128 mg/dL — ABNORMAL HIGH (ref 65–99)
Glucose-Capillary: 126 mg/dL — ABNORMAL HIGH (ref 65–99)
Glucose-Capillary: 102 mg/dL — ABNORMAL HIGH (ref 65–99)

## 2015-07-05 LAB — VALPROIC ACID LEVEL: Valproic Acid Lvl: 60 ug/mL (ref 50.0–100.0)

## 2015-07-05 LAB — TROPONIN I: Troponin I: <0.03 ng/mL (ref <0.031)

## 2015-07-05 MED ORDER — ALBUTEROL SULFATE HFA 108 (90 BASE) MCG/ACT IN AERS: 2.0000 | INHALATION_SPRAY | RESPIRATORY_TRACT | Status: DC | PRN

## 2015-07-05 MED ORDER — DIVALPROEX SODIUM ER 500 MG PO TB24
500.0000 mg | ORAL_TABLET | Freq: Two times a day (BID) | ORAL | Status: DC
  Administered 2015-07-05 – 2015-07-06 (×2): 500 mg via ORAL
  Filled 2015-07-05 (×2): qty 1

## 2015-07-05 MED ORDER — DONEPEZIL HCL 5 MG PO TABS
10.0000 mg | ORAL_TABLET | Freq: Every day | ORAL | Status: DC
  Administered 2015-07-05: 10 mg via ORAL
  Filled 2015-07-05: qty 2

## 2015-07-05 MED ORDER — SODIUM CHLORIDE 0.9 % IJ SOLN
3.0000 mL | Freq: Two times a day (BID) | INTRAMUSCULAR | Status: DC
  Administered 2015-07-05: 3 mL via INTRAVENOUS

## 2015-07-05 MED ORDER — INFLUENZA VAC SPLIT QUAD 0.5 ML IM SUSY
0.5000 mL | PREFILLED_SYRINGE | INTRAMUSCULAR | Status: AC
  Administered 2015-07-06: 0.5 mL via INTRAMUSCULAR
  Filled 2015-07-05: qty 0.5

## 2015-07-05 MED ORDER — INSULIN ASPART 100 UNIT/ML ~~LOC~~ SOLN: 0.0000 [IU] | Freq: Every day | SUBCUTANEOUS | Status: DC

## 2015-07-05 MED ORDER — ONDANSETRON HCL 4 MG/2ML IJ SOLN: 4.0000 mg | Freq: Four times a day (QID) | INTRAMUSCULAR | Status: DC | PRN

## 2015-07-05 MED ORDER — SODIUM CHLORIDE 0.9 % IV SOLN
Freq: Once | INTRAVENOUS | Status: AC
  Administered 2015-07-05: 1000 mL via INTRAVENOUS

## 2015-07-05 MED ORDER — QUETIAPINE FUMARATE 100 MG PO TABS
300.0000 mg | ORAL_TABLET | Freq: Every day | ORAL | Status: DC
  Administered 2015-07-06: 300 mg via ORAL
  Filled 2015-07-05: qty 3

## 2015-07-05 MED ORDER — ACETAMINOPHEN 325 MG PO TABS
650.0000 mg | ORAL_TABLET | Freq: Four times a day (QID) | ORAL | Status: DC | PRN
Start: 2015-07-05 — End: 2015-07-06

## 2015-07-05 MED ORDER — PANTOPRAZOLE SODIUM 40 MG PO TBEC
40.0000 mg | DELAYED_RELEASE_TABLET | Freq: Every day | ORAL | Status: DC
  Administered 2015-07-05 – 2015-07-06 (×2): 40 mg via ORAL
  Filled 2015-07-05 (×2): qty 1

## 2015-07-05 MED ORDER — MEMANTINE HCL-DONEPEZIL HCL ER 28-10 MG PO CP24: 1.0000 | ORAL_CAPSULE | Freq: Every day | ORAL | Status: DC

## 2015-07-05 MED ORDER — TRAZODONE HCL 100 MG PO TABS
200.0000 mg | ORAL_TABLET | Freq: Every day | ORAL | Status: DC
  Administered 2015-07-05: 200 mg via ORAL
  Filled 2015-07-05: qty 2

## 2015-07-05 MED ORDER — SODIUM CHLORIDE 0.9 % IV SOLN
INTRAVENOUS | Status: DC
  Administered 2015-07-05 – 2015-07-06 (×2): via INTRAVENOUS

## 2015-07-05 MED ORDER — INSULIN ASPART 100 UNIT/ML ~~LOC~~ SOLN
0.0000 [IU] | Freq: Three times a day (TID) | SUBCUTANEOUS | Status: DC
  Administered 2015-07-06: 1 [IU] via SUBCUTANEOUS
  Filled 2015-07-05: qty 1

## 2015-07-05 MED ORDER — POLYETHYLENE GLYCOL 3350 17 G PO PACK: 17.0000 g | PACK | Freq: Every day | ORAL | Status: DC | PRN

## 2015-07-05 MED ORDER — BENZTROPINE MESYLATE 1 MG PO TABS
1.0000 mg | ORAL_TABLET | Freq: Two times a day (BID) | ORAL | Status: DC
Start: 2015-07-05 — End: 2015-07-06
  Administered 2015-07-05 – 2015-07-06 (×2): 1 mg via ORAL
  Filled 2015-07-05 (×3): qty 1

## 2015-07-05 MED ORDER — ONDANSETRON HCL 4 MG PO TABS
4.0000 mg | ORAL_TABLET | Freq: Four times a day (QID) | ORAL | Status: DC | PRN
  Administered 2015-07-06: 4 mg via ORAL
  Filled 2015-07-05: qty 1

## 2015-07-05 MED ORDER — QUETIAPINE FUMARATE 100 MG PO TABS
600.0000 mg | ORAL_TABLET | Freq: Every day | ORAL | Status: DC
  Administered 2015-07-05: 600 mg via ORAL
  Filled 2015-07-05: qty 6

## 2015-07-05 MED ORDER — MEMANTINE HCL ER 28 MG PO CP24
28.0000 mg | ORAL_CAPSULE | Freq: Every day | ORAL | Status: DC
  Administered 2015-07-06: 28 mg via ORAL
  Filled 2015-07-05 (×2): qty 1

## 2015-07-05 MED ORDER — PRAVASTATIN SODIUM 40 MG PO TABS
40.0000 mg | ORAL_TABLET | Freq: Every day | ORAL | Status: DC
  Administered 2015-07-05: 40 mg via ORAL
  Filled 2015-07-05: qty 1

## 2015-07-05 MED ORDER — FLUPHENAZINE HCL 5 MG PO TABS
5.0000 mg | ORAL_TABLET | Freq: Two times a day (BID) | ORAL | Status: DC
  Administered 2015-07-05 – 2015-07-06 (×2): 5 mg via ORAL
  Filled 2015-07-05 (×3): qty 1

## 2015-07-05 MED ORDER — HEPARIN SODIUM (PORCINE) 5000 UNIT/ML IJ SOLN
5000.0000 [IU] | Freq: Three times a day (TID) | INTRAMUSCULAR | Status: DC
  Administered 2015-07-05 – 2015-07-06 (×2): 5000 [IU] via SUBCUTANEOUS
  Filled 2015-07-05 (×3): qty 1

## 2015-07-05 MED ORDER — ACETAMINOPHEN 650 MG RE SUPP
650.0000 mg | Freq: Four times a day (QID) | RECTAL | Status: DC | PRN
Start: 2015-07-05 — End: 2015-07-06

## 2015-07-05 MED ORDER — TIOTROPIUM BROMIDE MONOHYDRATE 18 MCG IN CAPS
18.0000 ug | ORAL_CAPSULE | Freq: Every day | RESPIRATORY_TRACT | Status: DC
  Administered 2015-07-06: 18 ug via RESPIRATORY_TRACT
  Filled 2015-07-05: qty 5

## 2015-07-05 MED ORDER — FAMOTIDINE 20 MG PO TABS
20.0000 mg | ORAL_TABLET | Freq: Every day | ORAL | Status: DC
  Administered 2015-07-06: 20 mg via ORAL
  Filled 2015-07-05: qty 1

## 2015-07-05 MED ORDER — ASPIRIN 81 MG PO CHEW
81.0000 mg | CHEWABLE_TABLET | Freq: Every day | ORAL | Status: DC
  Administered 2015-07-06: 81 mg via ORAL
  Filled 2015-07-05: qty 1

## 2015-07-05 MED ORDER — INSULIN GLARGINE 100 UNIT/ML ~~LOC~~ SOLN
30.0000 [IU] | Freq: Every day | SUBCUTANEOUS | Status: DC
  Administered 2015-07-05: 30 [IU] via SUBCUTANEOUS
  Filled 2015-07-05 (×3): qty 0.3

## 2015-07-05 MED ORDER — ALBUTEROL SULFATE (2.5 MG/3ML) 0.083% IN NEBU: 2.5000 mg | INHALATION_SOLUTION | RESPIRATORY_TRACT | Status: DC | PRN

## 2015-07-05 MED ORDER — LUBIPROSTONE 24 MCG PO CAPS
24.0000 ug | ORAL_CAPSULE | Freq: Two times a day (BID) | ORAL | Status: DC
  Administered 2015-07-05 – 2015-07-06 (×2): 24 ug via ORAL
  Filled 2015-07-05 (×3): qty 1

## 2015-07-05 NOTE — ED Notes (Signed)
"  Tobi Bastosnna" is accompanying the patient from the group home, Tobi Bastosanna states that the staff gave the patient all of his usual medications without checking the patient's blood pressure prior to administration.

## 2015-07-05 NOTE — Progress Notes (Signed)
Notified Dr Clent RidgesWalsh of pt "shaking and trembling"; told Dr that it resembled a seizure but that the pt was a/o and talking; also told Dr that pt was afebrile; Dr stated that she was aware of this and that it was occurring in ED as well. No new orders received

## 2015-07-05 NOTE — H&P (Signed)
[facility]   PATIENT NAME: Johnny [name]    MR#:  [id number]  DATE OF BIRTH:  04/29/1956  DATE OF ADMISSION:  07/05/2015  PRIMARY CARE PHYSICIAN: METZ,CHRISTINE   REQUESTING/REFERRING PHYSICIAN: Dr. Mayford Knife  CHIEF COMPLAINT:   Chief Complaint  Patient presents with  . Fall    HISTORY OF PRESENT ILLNESS:  Johnny [name]  is a 59 y.o. male with a known history of diabetes, hypertension, schizophrenia presents from group home with weakness. History is provided by his caretaker as the patient is a poor historian. She reports that he was in his usual state of health this morning when he went to his primary care physician. She states that at his primary care appointment his blood pressure was 120/60 and he felt fine during the appointment. While he was leaving the appointment he fell while walking across the parking lot. He stated that his legs became acutely weak and he was unable to walk. There is no associated pain. He fell multiple times. He felt presyncopal but did not lose consciousness. They called EMS and he was brought to the emergency room where he was found to have a blood pressure in the 70s over 40s. His caretaker reports that he has not been eating or drinking very well for several days. This is been achieved be due to depression as he lost his brother 2-1/2 weeks ago. He states that he has had some episodes of diaphoresis recently but no dysuria, abdominal pain nausea vomiting or diarrhea. No chest pain or shortness of breath.  PAST MEDICAL HISTORY:   Past Medical History  Diagnosis Date  . DM (diabetes mellitus) (HCC)   . Schizophrenia (HCC)   . Hyperlipidemia   . GERD (gastroesophageal reflux disease)   . Anemia   . Herpes   . Hypertension   . Diabetes mellitus without complication (HCC)   . Clostridium difficile colitis     PAST SURGICAL HISTORY:   Past Surgical History  Procedure Laterality Date  .  Cholecystectomy      SOCIAL HISTORY:   Social History  Substance Use Topics  . Smoking status: Current Every Day Smoker -- 1.00 packs/day    Types: Cigarettes  . Smokeless tobacco: Not on file  . Alcohol Use: No    FAMILY HISTORY:   Family History  Problem Relation Age of Onset  . Diabetes Mother   . Kidney failure Mother   . Diabetes Father   . CAD Brother     DRUG ALLERGIES:   Allergies  Allergen Reactions  . Fish Allergy Rash  . Shellfish Allergy Rash    REVIEW OF SYSTEMS:   Review of Systems  Constitutional: Positive for malaise/fatigue and diaphoresis. Negative for fever, chills and weight loss.  HENT: Negative for congestion and hearing loss.   Eyes: Negative for blurred vision and pain.  Respiratory: Negative for cough, hemoptysis, sputum production, shortness of breath and stridor.   Cardiovascular: Negative for chest pain, palpitations, orthopnea and leg swelling.  Gastrointestinal: Negative for nausea, vomiting, abdominal pain, diarrhea, constipation and blood in stool.  Genitourinary: Negative for dysuria and frequency.  Musculoskeletal: Negative for myalgias, back pain, joint pain and neck pain.  Skin: Negative for rash.  Neurological: Positive for dizziness, focal weakness and weakness. Negative for loss of consciousness and headaches.  Endo/Heme/Allergies: Does not bruise/bleed easily.  Psychiatric/Behavioral: Positive for depression. Negative for hallucinations. The patient is not nervous/anxious.     MEDICATIONS AT HOME:  Prior to Admission medications   Medication Sig Start Date End Date Taking? Authorizing Provider  albuterol (PROVENTIL HFA;VENTOLIN HFA) 108 (90 BASE) MCG/ACT inhaler Inhale 2 puffs into the lungs every 4 (four) hours as needed for wheezing or shortness of breath.   Yes Historical Provider, MD  ammonium lactate (AMLACTIN) 12 % cream Apply 1 g topically daily.    Yes Historical Provider, MD  aspirin 81 MG chewable tablet Chew 81  mg by mouth daily.    Yes Historical Provider, MD  benazepril-hydrochlorthiazide (LOTENSIN HCT) 20-12.5 MG per tablet Take 1 tablet by mouth daily.   Yes Historical Provider, MD  benztropine (COGENTIN) 1 MG tablet Take 1 mg by mouth 2 (two) times daily.   Yes Historical Provider, MD  Canagliflozin-Metformin HCl (INVOKAMET) 50-1000 MG TABS Take 1 tablet by mouth 2 (two) times daily with a meal.   Yes Historical Provider, MD  clonazePAM (KLONOPIN) 1 MG tablet Take 1 mg by mouth 3 (three) times daily.   Yes Historical Provider, MD  colesevelam (WELCHOL) 625 MG tablet Take 1,875 mg by mouth 2 (two) times daily.    Yes Historical Provider, MD  divalproex (DEPAKOTE ER) 500 MG 24 hr tablet Take 500 mg by mouth 2 (two) times daily.    Yes Historical Provider, MD  fluPHENAZine (PROLIXIN) 5 MG tablet Take 5 mg by mouth 2 (two) times daily. Pt also takes daily as needed for psychosis/agitation.   Yes Historical Provider, MD  folic acid (FOLVITE) 1 MG tablet Take 1 mg by mouth daily.   Yes Historical Provider, MD  insulin glargine (LANTUS) 100 UNIT/ML injection Inject 30 Units into the skin at bedtime.   Yes Historical Provider, MD  lubiprostone (AMITIZA) 24 MCG capsule Take 24 mcg by mouth 2 (two) times daily.    Yes Historical Provider, MD  Memantine HCl-Donepezil HCl (NAMZARIC) 28-10 MG CP24 Take 1 capsule by mouth at bedtime.    Yes Historical Provider, MD  omeprazole (PRILOSEC) 20 MG capsule Take 20 mg by mouth daily.   Yes Historical Provider, MD  ondansetron (ZOFRAN ODT) 8 MG disintegrating tablet Take 1 tablet (8 mg total) by mouth every 8 (eight) hours as needed for nausea or vomiting. 06/29/15  Yes Sharman Cheek, MD  paliperidone (INVEGA SUSTENNA) 156 MG/ML SUSP injection Inject 156 mg into the muscle every 30 (thirty) days.   Yes Historical Provider, MD  pioglitazone (ACTOS) 45 MG tablet Take 45 mg by mouth daily.   Yes Historical Provider, MD  polyethylene glycol (MIRALAX / GLYCOLAX) packet Take  17 g by mouth daily.   Yes Historical Provider, MD  pravastatin (PRAVACHOL) 40 MG tablet Take 40 mg by mouth at bedtime.    Yes Historical Provider, MD  QUEtiapine (SEROQUEL) 300 MG tablet Take 300 mg by mouth daily.   Yes Historical Provider, MD  QUEtiapine (SEROQUEL) 400 MG tablet Take 600 mg by mouth at bedtime.   Yes Historical Provider, MD  ranitidine (ZANTAC) 150 MG capsule Take 1 capsule (150 mg total) by mouth 2 (two) times daily. 06/29/15  Yes Sharman Cheek, MD  sitaGLIPtin (JANUVIA) 100 MG tablet Take 100 mg by mouth daily.   Yes Historical Provider, MD  tiotropium (SPIRIVA) 18 MCG inhalation capsule Place 18 mcg into inhaler and inhale daily.   Yes Historical Provider, MD  traZODone (DESYREL) 100 MG tablet Take 200 mg by mouth at bedtime.   Yes Historical Provider, MD  Vitamin D, Ergocalciferol, (DRISDOL) 50000 UNITS CAPS capsule Take 50,000 Units by mouth  every 7 (seven) days.   Yes Historical Provider, MD      VITAL SIGNS:  Blood pressure 95/57, pulse 99, temperature 97.7 F (36.5 C), temperature source Oral, resp. rate 13, height 5\' 9"  (1.753 m), weight 69.854 kg (154 lb), SpO2 99 %.  PHYSICAL EXAMINATION:  GENERAL:  59 y.o.-year-old patient lying in the bed with no acute distress.  EYES: Pupils equal, round, reactive to light and accommodation. No scleral icterus. Extraocular muscles intact.  HEENT: Head atraumatic, normocephalic. Oropharynx and nasopharynx clear. His membranes are moist. Very poor dentition. NECK:  Supple, no jugular venous distention. No thyroid enlargement, no tenderness.  LUNGS: Normal breath sounds bilaterally, no wheezing, rales,rhonchi or crepitation. No use of accessory muscles of respiration.  CARDIOVASCULAR: S1, S2 normal. No murmurs, rubs, or gallops.  ABDOMEN: Soft, nontender, nondistended. Bowel sounds present. No organomegaly or mass. He does have an abdominal bruit on the left EXTREMITIES: No pedal edema, cyanosis, or clubbing. Peripheral  pulses 1+ NEUROLOGIC: Cranial nerves II through XII are grossly intact. Muscle strength 5/5 in all extremities. Sensation intact. Gait not checked.  PSYCHIATRIC: The patient is alert and oriented x 3.  SKIN: No obvious rash, lesion, or ulcer.   LABORATORY PANEL:   CBC  Recent Labs Lab 07/05/15 1431  WBC 12.2*  HGB 13.0  HCT 40.3  PLT 243   ------------------------------------------------------------------------------------------------------------------  Chemistries   Recent Labs Lab 07/02/15 1337 07/05/15 1431  NA 133* 130*  K 4.2 4.1  CL 96* 91*  CO2 27 26  GLUCOSE 152* 160*  BUN 26* 35*  CREATININE 1.30* 2.39*  CALCIUM 10.1 9.6  AST 19  --   ALT 16*  --   ALKPHOS 66  --   BILITOT 0.2*  --    ------------------------------------------------------------------------------------------------------------------  Cardiac Enzymes  Recent Labs Lab 07/05/15 1431  TROPONINI <0.03   ------------------------------------------------------------------------------------------------------------------  RADIOLOGY:  Dg Chest 1 View  07/05/2015  CLINICAL DATA:  Hypotension. EXAM: CHEST 1 VIEW COMPARISON:  PA and lateral chest 03/23/2015. Single view of the chest 02/19/2010. FINDINGS: The lungs are clear. Heart size is normal. No pneumothorax or pleural effusion. No focal bony abnormality. IMPRESSION: Negative chest. Electronically Signed   By: Drusilla Kannerhomas  Dalessio M.D.   On: 07/05/2015 15:12    EKG:   Orders placed or performed during the hospital encounter of 07/05/15  . EKG 12-Lead  . EKG 12-Lead  . EKG 12-Lead  . EKG 12-Lead    IMPRESSION AND PLAN:   #1 hypotension: This is likely multifactorial and due to decreased oral intake as well as continuation of antihypertensives. He is responding slowly to volume replacement. He has received 2 L of fluid in the emergency room and will continue to be on normal saline. He is not septic. We will go ahead and check a UA. Chest x-ray  is clear. Hold antihypertensives.  #2 acute renal failure: Baseline creatinine is about 1.3 with a GFR of greater than 60. Today is 2.39 likely due to ATN, hypertension volume depletion. UA is pending. We'll continue IV fluids. If no improvement within several hours would check renal ultrasound. Electrolytes are stable.  #3 diabetes mellitus type 2: He reports that his blood sugars have been elevated. I have reviewed his MAR and the highest blood sugars have seen are 250. I do not think hyperglycemia has played a role in his volume depletion. We'll continue his home dose of Lantus and add sliding scale. Hold oral hypoglycemic agents for now. Carbohydrate modified diet.  #4 schizophrenia:  Stable. He does have a history of behavioral disturbance. Will continue home medications. Until to psychiatry has been placed due to recent symptoms of worsening depression after his brother's death.  #5 fall: Will get a physical therapy evaluation for tomorrow after we have worked to normalize his blood pressure.    All the records are reviewed and case discussed with ED provider. Management plans discussed with the patient, family and they are in agreement. Care plan discussed with caregiver from group home.  CODE STATUS: Full  TOTAL TIME TAKING CARE OF THIS PATIENT: 40 minutes.  Greater than 50% of time spent in coordination of care and counseling.  Elby Showers M.D on 07/05/2015 at 4:17 PM  Between 7am to 6pm - Pager - [phone]  After 6pm go to [email or web address] - password [email or web address] [facility]  [facility]  Office  [phone]  CC: Primary care physician; METZ,CHRISTINE

## 2015-07-05 NOTE — ED Provider Notes (Signed)
[facility] Emergency Department Provider Note     Time seen: ----------------------------------------- 2:44 PM on 07/05/2015 -----------------------------------------    I have reviewed the triage vital signs and the nursing notes.   HISTORY  Chief Complaint Fall    HPI Johnny [name] is a 59 y.o. male brought to the ER for falling. Patient was seen here twice recently and by the primary care doctor today. Primary care doctor suggested they bring the patient back to the ER. He presents hypotensive on arrival here 70s over 40s. They did not note any recent illness. Patient denies any complaints other than weakness currently. He has received all of his medications and recently got his [place] shot today.   Past Medical History  Diagnosis Date  . DM (diabetes mellitus) (HCC)   . Schizophrenia (HCC)   . Hyperlipidemia   . GERD (gastroesophageal reflux disease)   . Anemia   . Herpes   . Hypertension   . Diabetes mellitus without complication [facility](HCC)     Patient Active Problem List   Diagnosis Date Noted  . SINUS TACHYCARDIA 06/10/2008  . HYPOTENSION, ORTHOSTATIC 06/10/2008  . DIABETES MELLITUS, CONTROLLED, WITHOUT COMPLICATIONS 05/19/2008  . HYPOGLYCEMIA 04/27/2008  . HYPERLIPIDEMIA 01/22/2008  . SCHIZOPHRENIA 01/22/2008  . ANXIETY 01/22/2008  . DEPRESSION 01/22/2008  . HYPERTENSION 01/22/2008  . MYOCARDIAL INFARCTION, HX OF 01/22/2008  . BRONCHITIS, CHRONIC 01/22/2008  . GERD 01/22/2008    Past Surgical History  Procedure Laterality Date  . Cholecystectomy      Allergies Fish allergy and Shellfish allergy  Social History Social History  Substance Use Topics  . Smoking status: Current Every Day Smoker -- 1.00 packs/day    Types: Cigarettes  . Smokeless tobacco: None  . Alcohol Use: No    Review of Systems Constitutional: Negative for fever. Eyes: Negative for visual changes. ENT: Negative for sore throat. Cardiovascular:  Negative for chest pain. Respiratory: Negative for shortness of breath. Gastrointestinal: Negative for abdominal pain, vomiting and diarrhea. Genitourinary: Negative for dysuria. Musculoskeletal: Negative for back pain. Skin: Negative for rash. Neurological: Negative for headaches, positive for weakness  10-point ROS otherwise negative.  ____________________________________________   PHYSICAL EXAM:  VITAL SIGNS: ED Triage Vitals  Enc Vitals Group     BP 07/05/15 1420 74/55 mmHg     Pulse Rate 07/05/15 1420 109     Resp 07/05/15 1420 16     Temp 07/05/15 1420 97.7 F (36.5 C)     Temp Source 07/05/15 1420 Oral     SpO2 07/05/15 1420 97 %     Weight 07/05/15 1420 154 lb (69.854 kg)     Height 07/05/15 1420 5\' 9"  (1.753 m)     Head Cir --      Peak Flow --      Pain Score --      Pain Loc --      Pain Edu? --      Excl. in GC? --     Constitutional: Alert and oriented. Well appearing and in no distress. Eyes: Conjunctivae are normal. PERRL. Normal extraocular movements. ENT   Head: Normocephalic and atraumatic.   Nose: No congestion/rhinnorhea.   Mouth/Throat: Mucous membranes are moist.   Neck: No stridor. Cardiovascular: Normal rate, regular rhythm. Normal and symmetric distal pulses are present in all extremities. No murmurs, rubs, or gallops. Respiratory: Normal respiratory effort without tachypnea nor retractions. Breath sounds are clear and equal bilaterally. No wheezes/rales/rhonchi. Gastrointestinal: Soft and nontender. No distention. No abdominal bruits.  Musculoskeletal: Nontender with normal range of motion in all extremities. No joint effusions.  No lower extremity tenderness nor edema. Neurologic:  Normal speech and language. No gross focal neurologic deficits are appreciated. Speech is normal. No gait instability. Skin:  Skin is warm, dry and intact. No rash noted. Psychiatric: Mood and affect are normal. Speech and behavior are normal. Patient  exhibits appropriate insight and judgment. ____________________________________________  EKG: Interpreted by me. Sinus tachycardia with rate of 105 bpm, normal PR interval, normal QRS with, normal QT interval. There are nonspecific ST and T-wave changes.  ____________________________________________  ED COURSE:  Pertinent labs & imaging results that were available during my care of the patient were reviewed by me and considered in my medical decision making (see chart for details). Patient is in no acute distress but is hypotensive. We will give him a couple liters of fluid and reevaluate. ____________________________________________    LABS (pertinent positives/negatives)  Labs Reviewed  CBC WITH DIFFERENTIAL/PLATELET - Abnormal; Notable for the following:    WBC 12.2 (*)    RDW 15.5 (*)    Neutro Abs 8.2 (*)    Monocytes Absolute 1.4 (*)    All other components within normal limits  BASIC METABOLIC PANEL - Abnormal; Notable for the following:    Sodium 130 (*)    Chloride 91 (*)    Glucose, Bld 160 (*)    BUN 35 (*)    Creatinine, Ser 2.39 (*)    GFR calc non Af Amer 28 (*)    GFR calc Af Amer 33 (*)    All other components within normal limits  GLUCOSE, CAPILLARY - Abnormal; Notable for the following:    Glucose-Capillary 128 (*)    All other components within normal limits  TROPONIN I  URINALYSIS COMPLETEWITH MICROSCOPIC ([facility] ONLY)  VALPROIC ACID LEVEL  POCT CBG (FASTING - GLUCOSE)-MANUAL ENTRY    RADIOLOGY  Chest x-ray Is unremarkable ____________________________________________  FINAL ASSESSMENT AND PLAN  Weakness, hypotension, acute renal failure  Plan: Patient with labs and imaging as dictated above. Patient with acute renal failure of unclear etiology. His renal function is perhaps hydration related in that he has not been eating and drinking normally. His falls are likely from his hypotension secondary to dehydration. He may also be depressed however he  has been taking his medications as prescribed. He is receiving a 2 L fluid bolus, will need observation to the hospital.   Emily Filbert, MD   Emily Filbert, MD 07/05/15 [phone]

## 2015-07-05 NOTE — ED Notes (Signed)
Caregiver says pateint has been falling.  Seen here x 2 and at pcp today.  pcp suggested they bring pt back to er.

## 2015-07-05 NOTE — Progress Notes (Signed)
Gave pastoral care & spiritual support to patient and family.  Very nice family. Chaplain Ronney Liononna S Theodore Rahrig Ext [phone]

## 2015-07-06 DIAGNOSIS — N179 Acute kidney failure, unspecified: Secondary | ICD-10-CM | POA: Diagnosis not present

## 2015-07-06 LAB — URINALYSIS COMPLETE WITH MICROSCOPIC (ARMC ONLY)
BILIRUBIN URINE: NEGATIVE
Bacteria, UA: NONE SEEN
HGB URINE DIPSTICK: NEGATIVE
KETONES UR: NEGATIVE mg/dL
LEUKOCYTES UA: NEGATIVE
NITRITE: NEGATIVE
Protein, ur: NEGATIVE mg/dL
RBC / HPF: NONE SEEN RBC/hpf (ref 0–5)
SPECIFIC GRAVITY, URINE: 1.015 (ref 1.005–1.030)
pH: 5 (ref 5.0–8.0)

## 2015-07-06 LAB — BASIC METABOLIC PANEL
ANION GAP: 7 (ref 5–15)
BUN: 22 mg/dL — ABNORMAL HIGH (ref 6–20)
CO2: 29 mmol/L (ref 22–32)
Calcium: 8.8 mg/dL — ABNORMAL LOW (ref 8.9–10.3)
Chloride: 99 mmol/L — ABNORMAL LOW (ref 101–111)
Creatinine, Ser: 1 mg/dL (ref 0.61–1.24)
GFR calc Af Amer: >60 mL/min (ref >60)
GLUCOSE: 102 mg/dL — AB (ref 65–99)
POTASSIUM: 3.6 mmol/L (ref 3.5–5.1)
Sodium: 135 mmol/L (ref 135–145)

## 2015-07-06 LAB — HEMOGLOBIN A1C: Hgb A1c MFr Bld: 6.8 % — ABNORMAL HIGH (ref 4.0–6.0)

## 2015-07-06 LAB — CBC
HEMATOCRIT: 36.7 % — AB (ref 40.0–52.0)
Hemoglobin: 12 g/dL — ABNORMAL LOW (ref 13.0–18.0)
MCH: 29.4 pg (ref 26.0–34.0)
MCHC: 32.8 g/dL (ref 32.0–36.0)
MCV: 89.8 fL (ref 80.0–100.0)
PLATELETS: 218 10*3/uL (ref 150–440)
RBC: 4.08 MIL/uL — AB (ref 4.40–5.90)
RDW: 14.9 % — ABNORMAL HIGH (ref 11.5–14.5)
WBC: 13.7 10*3/uL — AB (ref 3.8–10.6)

## 2015-07-06 LAB — GLUCOSE, CAPILLARY
Glucose-Capillary: 84 mg/dL (ref 65–99)
Glucose-Capillary: 138 mg/dL — ABNORMAL HIGH (ref 65–99)

## 2015-07-06 MED ORDER — CLONAZEPAM 1 MG PO TABS
1.0000 mg | ORAL_TABLET | Freq: Three times a day (TID) | ORAL | Status: DC
  Administered 2015-07-06 (×2): 1 mg via ORAL
  Filled 2015-07-06 (×2): qty 1

## 2015-07-06 NOTE — Progress Notes (Signed)
Visited patient this morning -- gave pastoral care and support.  Patient going home today.   Chaplain Ronney Liononna S Kaavya Puskarich Ext [phone]

## 2015-07-06 NOTE — Discharge Instructions (Signed)
°  DIET:  °Regular diet ° °DISCHARGE CONDITION:  °Stable ° °ACTIVITY:  °Activity as tolerated ° °OXYGEN:  °Home Oxygen: No. °  °Oxygen Delivery: room air ° °DISCHARGE LOCATION:  °group home  ° °If you experience worsening of your admission symptoms, develop shortness of breath, life threatening emergency, suicidal or homicidal thoughts you must seek medical attention immediately by calling 911 or calling your MD immediately  if symptoms less severe. ° °You Must read complete instructions/literature along with all the possible adverse reactions/side effects for all the Medicines you take and that have been prescribed to you. Take any new Medicines after you have completely understood and accpet all the possible adverse reactions/side effects.  ° °Please note ° °You were cared for by a hospitalist during your hospital stay. If you have any questions about your discharge medications or the care you received while you were in the hospital after you are discharged, you can call the unit and asked to speak with the hospitalist on call if the hospitalist that took care of you is not available. Once you are discharged, your primary care physician will handle any further medical issues. Please note that NO REFILLS for any discharge medications will be authorized once you are discharged, as it is imperative that you return to your primary care physician (or establish a relationship with a primary care physician if you do not have one) for your aftercare needs so that they can reassess your need for medications and monitor your lab values. ° ° °

## 2015-07-06 NOTE — Progress Notes (Signed)
Pt d/c to [facility]; Sharia ReeveAnna Woods, Chiropodistassistant director of [facility], present during discharge; packet given to her; all questions answered; IV removed, catheter in tact, gauze dressing applied; Pt left unit via wheelchair accompanied by staff and Sharia ReeveAnna Woods; Pt to be transported to facility by Sharia ReeveAnna Woods.

## 2015-07-06 NOTE — Clinical Social Work Note (Signed)
Clinical Social Work Assessment  Patient Details  Name: Johnny GivensJohnnie L [name] MRN: [id number] Date of Birth: [birth year]  Date of referral:  07/06/15               Reason for consult:  Facility Placement                Permission sought to share information with:  Facility Industrial/product designerContact Representative Permission granted to share information::  Yes, Verbal Permission Granted  Name::        Agency::     Relationship::     Contact Information:     Housing/Transportation Living arrangements for the past 2 months:  Group Home Source of Information:  Patient Patient Interpreter Needed:  None Criminal Activity/Legal Involvement Pertinent to Current Situation/Hospitalization:  No - Comment as needed Significant Relationships:    Lives with:    Do you feel safe going back to the place where you live?  Yes Need for family participation in patient care:  No (Coment)  Care giving concerns:  [facility]   Social Worker assessment / plan:  [facility]rop[facility] was in the patient's room at time of CSW visit. MD is discharging patient today to return to group home. Patient is in agreement and the Chiropodistassistant director is able to transport.   Employment status:  Disabled (Comment on whether or not currently receiving Disability) Insurance information:  Medicare PT Recommendations:  Not assessed at this time Information / Referral to community resources:     Patient/Family's Response to care:  Patient happy about returning to [facility] today and getting to leave the hospital.  Patient/Family's Understanding of and Emotional Response to Diagnosis, Current Treatment, and Prognosis:  Patient is looking forward to discharging.  Emotional Assessment Appearance:    Attitude/Demeanor/Rapport:   (pleasant and cooperative) Affect (typically observed):  Accepting, Appropriate Orientation:  Oriented to Self, Oriented to Place, Oriented to  Time, Oriented to Situation Alcohol / Substance use:   Not Applicable Psych involvement (Current and /or in the community):  Outpatient Provider  Discharge Needs  Concerns to be addressed:  Care Coordination Readmission within the last 30 days:  No Current discharge risk:  None Barriers to Discharge:  No Barriers Identified   Johnny SpanielMonica Kiona Blume, LCSW 07/06/2015, 2:28 PM

## 2015-07-06 NOTE — Progress Notes (Signed)
Dr. Clint Guyhower notified of pt increasing agitation. Okay to restart PO clonipine for anxiety three times daily.

## 2015-07-06 NOTE — Evaluation (Signed)
Physical Therapy Evaluation Patient Details Name: Johnny GivensJohnnie L [name] MRN: [id number] DOB: 1956/04/01 Today's Date: 07/06/2015   History of Present Illness  Patient is a 59 y/o male that [facility] after falling multiple times in parking lot after seeing his primary care provider. Patient noted to have low BPs in the ER (70s over 40s). Patient has past medical history of schizophrenia and recently lost a brother, he has apparently not been eating or drinking much recently.   Clinical Impression  Patient is stated to have had several falls in the parking lot after visiting his PCP yesterday. He displays orthostatic blood pressure today but does not have any symptoms associated with these. He appears to be at his baseline physically per PT judgement and patient report. Patient does drift to the right somewhat during ambulation, but does not display any loss of balance during ambulation. This appears to be his baseline physically and does not demonstrate a high falls risk currently. No additional PT needs identified, PT will sign off.     Follow Up Recommendations No PT follow up    Equipment Recommendations       Recommendations for Other Services       Precautions / Restrictions Precautions Precautions: None Restrictions Weight Bearing Restrictions: No      Mobility  Bed Mobility Overal bed mobility: Independent             General bed mobility comments: Patient is able to transfer in and out of bed independently, appropriate speed.   Transfers Overall transfer level: Independent               General transfer comment: Patient demonstrates no loss of balance with sit to stand transfers.   Ambulation/Gait Ambulation/Gait assistance: Supervision Ambulation Distance (Feet): 225 Feet   Gait Pattern/deviations: WFL(Within Functional Limits);Drifts right/left   Gait velocity interpretation: at or above normal speed for age/gender General Gait Details: Patient has  slight drift to the right, no other deficits noted.   Stairs            Wheelchair Mobility    Modified Rankin (Stroke Patients Only)       Balance Overall balance assessment: Independent                                           Pertinent Vitals/Pain Pain Assessment: No/denies pain    Home Living Family/patient expects to be discharged to:: Group home                 Additional Comments: His group home has a ramp to enter.     Prior Function Level of Independence: Independent         Comments: Per caregiver present no other falls aside from those he experienced to [facility].      Hand Dominance        Extremity/Trunk Assessment   Upper Extremity Assessment: Overall WFL for tasks assessed           Lower Extremity Assessment: Overall WFL for tasks assessed      Cervical / Trunk Assessment: Normal  Communication   Communication: No difficulties  Cognition Arousal/Alertness: Awake/alert Behavior During Therapy: WFL for tasks assessed/performed;Flat affect Overall Cognitive Status: History of cognitive impairments - at baseline  General Comments General comments (skin integrity, edema, etc.): 5x sit to stand 13 seconds (would have been faster, needed cuing to stand up each time), modified DGI 11/12 indicating a low falls risk.     Exercises        Assessment/Plan    PT Assessment Patent does not need any further PT services  PT Diagnosis     PT Problem List    PT Treatment Interventions     PT Goals (Current goals can be found in the Care Plan section) Acute Rehab PT Goals Patient Stated Goal: To return home safely  PT Goal Formulation: With patient Time For Goal Achievement: 07/20/15 Potential to Achieve Goals: Good    Frequency     Barriers to discharge        Co-evaluation               End of Session Equipment Utilized During Treatment: Gait belt Activity  Tolerance: Patient tolerated treatment well Patient left: in bed;with family/visitor present;with call bell/phone within reach;with bed alarm set Nurse Communication: Mobility status    Functional Assessment Tool Used: 5x sit to stand, modified DGI, clinical judgement  Functional Limitation: Mobility: Walking and moving around Mobility: Walking and Moving Around Current Status [phone]): 0 percent impaired, limited or restricted Mobility: Walking and Moving Around Discharge Status [phone]): 0 percent impaired, limited or restricted    Time: 9629-5284 PT Time Calculation (min) (ACUTE ONLY): 18 min   Charges:   PT Evaluation $Initial PT Evaluation Tier I: 1 Procedure     PT G Codes:   PT G-Codes **NOT FOR INPATIENT CLASS** Functional Assessment Tool Used: 5x sit to stand, modified DGI, clinical judgement  Functional Limitation: Mobility: Walking and moving around Mobility: Walking and Moving Around Current Status ([phone]): 0 percent impaired, limited or restricted Mobility: Walking and Moving Around Discharge Status [phone]): 0 percent impaired, limited or restricted   Kerin Ransom, PT, DPT    07/06/2015, 9:33 AM

## 2015-07-06 NOTE — Discharge Summary (Signed)
[facility] [place] [facility]   PATIENT NAME: Johnny [name]    MR#:  [id number]  DATE OF BIRTH:  1955/11/20  DATE OF ADMISSION:  07/05/2015 ADMITTING PHYSICIAN: Gale Journey, MD  DATE OF DISCHARGE: 07/06/2015  PRIMARY CARE PHYSICIAN: METZ,CHRISTINE    ADMISSION DIAGNOSIS:  Hypotension, unspecified hypotension type [I95.9] Acute renal failure, unspecified acute renal failure type (HCC) [N17.9]  DISCHARGE DIAGNOSIS:  Active Problems:   Acute renal failure (ARF) (HCC)   SECONDARY DIAGNOSIS:   Past Medical History  Diagnosis Date  . DM (diabetes mellitus) (HCC)   . Schizophrenia (HCC)   . Hyperlipidemia   . GERD (gastroesophageal reflux disease)   . Anemia   . Herpes   . Hypertension   . Diabetes mellitus without complication (HCC)   . Clostridium difficile colitis     HOSPITAL COURSE:   59 year old male with past medical history of diabetes, hypertension, hyperlipidemia, GERD, schizophrenia, chronic anemia, history of C. difficile colitis who presented to the hospital with weakness and dizziness and noted to be hypotensive and in acute renal failure.  #1 hypotension-this was likely multifactorial in nature. Likely related to patient's poor by mouth intake and concomitant use of antihypertensives. Patient has received some IV fluids and his hypotension has since then improved and resolved. -There has been no evidence of sepsis. Patient's urinalysis and chest x-ray and were negative on admission.  #2 acute renal failure-this was likely secondary to acute renal necrosis from the dehydration and hypotension. Patient has been adequately hydrated with IV fluids and his renal function has returned to baseline.  #3 hypertension-patient's antihypertensives were held initially given his relative hypotension and renal failure. Although since his renal function is back to baseline and his hypotension is resolved he will resume his benazepril HCTZ  upon discharge.  #4 type 2 diabetes without complication-patient's oral hypoglycemics were held in the hospital given his renal failure. Although this will be resumed now upon discharge. Patient can also resume his Levemir as stated below.  #5 hyperlipidemia-patient will continue his Pravachol.  #6 history of schizophrenia-patient will continue his Seroquel, Depakote.  #7 COPD-no acute exacerbation. Patient will continue his Spiriva.  #8 GERD-patient will continue his ranitidine.   DISCHARGE CONDITIONS:   Stable  CONSULTS OBTAINED:     DRUG ALLERGIES:   Allergies  Allergen Reactions  . Fish Allergy Rash  . Shellfish Allergy Rash    DISCHARGE MEDICATIONS:   Current Discharge Medication List    CONTINUE these medications which have NOT CHANGED   Details  albuterol (PROVENTIL HFA;VENTOLIN HFA) 108 (90 BASE) MCG/ACT inhaler Inhale 2 puffs into the lungs every 4 (four) hours as needed for wheezing or shortness of breath.    ammonium lactate (AMLACTIN) 12 % cream Apply 1 g topically daily.     aspirin 81 MG chewable tablet Chew 81 mg by mouth daily.     benazepril-hydrochlorthiazide (LOTENSIN HCT) 20-12.5 MG per tablet Take 1 tablet by mouth daily.    benztropine (COGENTIN) 1 MG tablet Take 1 mg by mouth 2 (two) times daily.    Canagliflozin-Metformin HCl (INVOKAMET) 50-1000 MG TABS Take 1 tablet by mouth 2 (two) times daily with a meal.    clonazePAM (KLONOPIN) 1 MG tablet Take 1 mg by mouth 3 (three) times daily.    colesevelam (WELCHOL) 625 MG tablet Take 1,875 mg by mouth 2 (two) times daily.     divalproex (DEPAKOTE ER) 500 MG 24 hr tablet Take 500 mg by mouth  2 (two) times daily.     fluPHENAZine (PROLIXIN) 5 MG tablet Take 5 mg by mouth 2 (two) times daily. Pt also takes daily as needed for psychosis/agitation.    folic acid (FOLVITE) 1 MG tablet Take 1 mg by mouth daily.    insulin glargine (LANTUS) 100 UNIT/ML injection Inject 30 Units into the skin at  bedtime.    lubiprostone (AMITIZA) 24 MCG capsule Take 24 mcg by mouth 2 (two) times daily.     Memantine HCl-Donepezil HCl (NAMZARIC) 28-10 MG CP24 Take 1 capsule by mouth at bedtime.     omeprazole (PRILOSEC) 20 MG capsule Take 20 mg by mouth daily.    ondansetron (ZOFRAN ODT) 8 MG disintegrating tablet Take 1 tablet (8 mg total) by mouth every 8 (eight) hours as needed for nausea or vomiting. Qty: 20 tablet, Refills: 0    paliperidone (INVEGA SUSTENNA) 156 MG/ML SUSP injection Inject 156 mg into the muscle every 30 (thirty) days.    pioglitazone (ACTOS) 45 MG tablet Take 45 mg by mouth daily.    polyethylene glycol (MIRALAX / GLYCOLAX) packet Take 17 g by mouth daily.    pravastatin (PRAVACHOL) 40 MG tablet Take 40 mg by mouth at bedtime.     !! QUEtiapine (SEROQUEL) 300 MG tablet Take 300 mg by mouth daily.    !! QUEtiapine (SEROQUEL) 400 MG tablet Take 600 mg by mouth at bedtime.    ranitidine (ZANTAC) 150 MG capsule Take 1 capsule (150 mg total) by mouth 2 (two) times daily. Qty: 28 capsule, Refills: 0    sitaGLIPtin (JANUVIA) 100 MG tablet Take 100 mg by mouth daily.    tiotropium (SPIRIVA) 18 MCG inhalation capsule Place 18 mcg into inhaler and inhale daily.    traZODone (DESYREL) 100 MG tablet Take 200 mg by mouth at bedtime.    Vitamin D, Ergocalciferol, (DRISDOL) 50000 UNITS CAPS capsule Take 50,000 Units by mouth every 7 (seven) days.     !! - Potential duplicate medications found. Please discuss with provider.       DISCHARGE INSTRUCTIONS:   DIET:  Diabetic diet  DISCHARGE CONDITION:  Stable  ACTIVITY:  Activity as tolerated  OXYGEN:  Home Oxygen: No.   Oxygen Delivery: room air  DISCHARGE LOCATION:  group home   If you experience worsening of your admission symptoms, develop shortness of breath, life threatening emergency, suicidal or homicidal thoughts you must seek medical attention immediately by calling 911 or calling your MD immediately  if  symptoms less severe.  You Must read complete instructions/literature along with all the possible adverse reactions/side effects for all the Medicines you take and that have been prescribed to you. Take any new Medicines after you have completely understood and accpet all the possible adverse reactions/side effects.   Please note  You were cared for by a hospitalist during your hospital stay. If you have any questions about your discharge medications or the care you received while you were in the hospital after you are discharged, you can call the unit and asked to speak with the hospitalist on call if the hospitalist that took care of you is not available. Once you are discharged, your primary care physician will handle any further medical issues. Please note that NO REFILLS for any discharge medications will be authorized once you are discharged, as it is imperative that you return to your primary care physician (or establish a relationship with a primary care physician if you do not have one) for your aftercare  needs so that they can reassess your need for medications and monitor your lab values.     Today   Renal function back to baseline. Hypotension resolved. No complaints presently. Ate breakfast well this morning.  VITAL SIGNS:  Blood pressure 127/69, pulse 101, temperature 98.4 F (36.9 C), temperature source Oral, resp. rate 21, height  (1.753 m), weight 69.854 kg (154 lb), SpO2 99 %.  I/O:   Intake/Output Summary (Last 24 hours) at 07/06/15 1006 Last data filed at 07/06/15 0804  Gross per 24 hour  Intake   1414 ml  Output    525 ml  Net    889 ml    PHYSICAL EXAMINATION:  GENERAL:  59 y.o.-year-old patient lying in the bed with no acute distress.  EYES: Pupils equal, round, reactive to light and accommodation. No scleral icterus. Extraocular muscles intact.  HEENT: Head atraumatic, normocephalic. Oropharynx and nasopharynx clear.  NECK:  Supple, no jugular venous  distention. No thyroid enlargement, no tenderness.  LUNGS: Normal breath sounds bilaterally, no wheezing, rales,rhonchi. No use of accessory muscles of respiration.  CARDIOVASCULAR: S1, S2 normal. No murmurs, rubs, or gallops.  ABDOMEN: Soft, non-tender, non-distended. Bowel sounds present. No organomegaly or mass.  EXTREMITIES: No pedal edema, cyanosis, or clubbing.  NEUROLOGIC: Cranial nerves II through XII are intact. No focal motor or sensory defecits b/l.  PSYCHIATRIC: The patient is alert and oriented x 3. Good affect.  SKIN: No obvious rash, lesion, or ulcer.   DATA REVIEW:   CBC  Recent Labs Lab 07/06/15 0554  WBC 13.7*  HGB 12.0*  HCT 36.7*  PLT 218    Chemistries   Recent Labs Lab 07/02/15 1337  07/06/15 0554  NA 133*  < > 135  K 4.2  < > 3.6  CL 96*  < > 99*  CO2 27  < > 29  GLUCOSE 152*  < > 102*  BUN 26*  < > 22*  CREATININE 1.30*  < > 1.00  CALCIUM 10.1  < > 8.8*  AST 19  --   --   ALT 16*  --   --   ALKPHOS 66  --   --   BILITOT 0.2*  --   --   < > = values in this interval not displayed.  Cardiac Enzymes  Recent Labs Lab 07/05/15 1431  TROPONINI <0.03    Microbiology Results  No results found for this or any previous visit.  RADIOLOGY:  Dg Chest 1 View  07/05/2015  CLINICAL DATA:  Hypotension. EXAM: CHEST 1 VIEW COMPARISON:  PA and lateral chest 03/23/2015. Single view of the chest 02/19/2010. FINDINGS: The lungs are clear. Heart size is normal. No pneumothorax or pleural effusion. No focal bony abnormality. IMPRESSION: Negative chest. Electronically Signed   By: Drusilla Kanner M.D.   On: 07/05/2015 15:12      Management plans discussed with the patient, family and they are in agreement.  CODE STATUS:     Code Status Orders        Start     Ordered   07/05/15 1733  Full code   Continuous     07/05/15 1732      TOTAL TIME TAKING CARE OF THIS PATIENT: 40 minutes.    Houston Siren M.D on 07/06/2015 at 10:06 AM  Between  7am to 6pm - Pager - [phone]  After 6pm go to [email or web address] - password [email or web address] [facility]  [place] [facility]  Office  [phone]  CC: Primary care  physician; METZ,CHRISTINE

## 2016-10-27 IMAGING — CR DG CHEST 2V
1 series · 2 of 2 positions shown · non-contrast
Comparison: 02/19/2010

CLINICAL DATA: Abdominal pain. No bowel movement today. Difficulty
walking. Shortness of breath.

EXAM:
CHEST  2 VIEW

[Series 1: dg chest 2 view · 0.14mm/px · 2 of 2 slices shown]
[im 1/2]
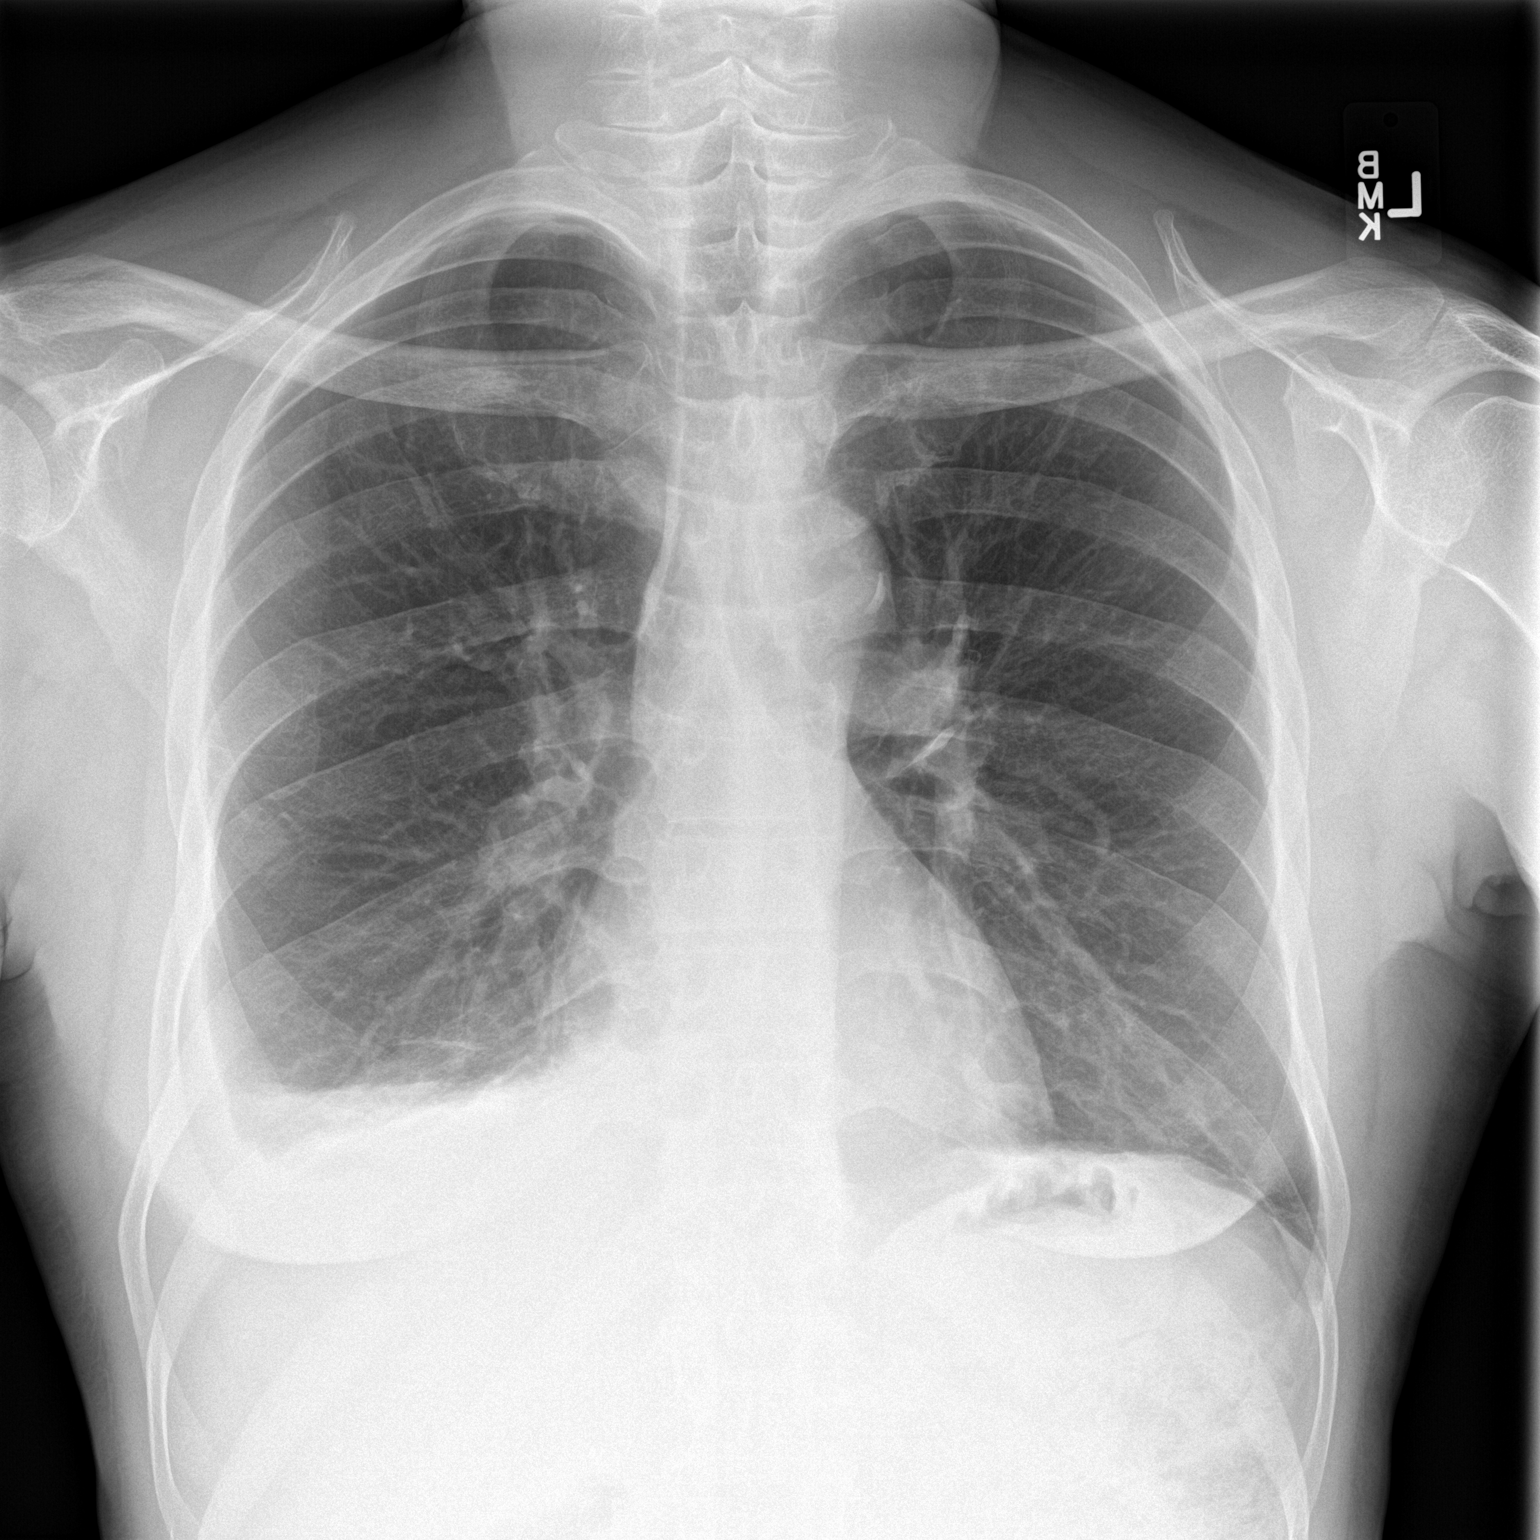
[im 2/2]
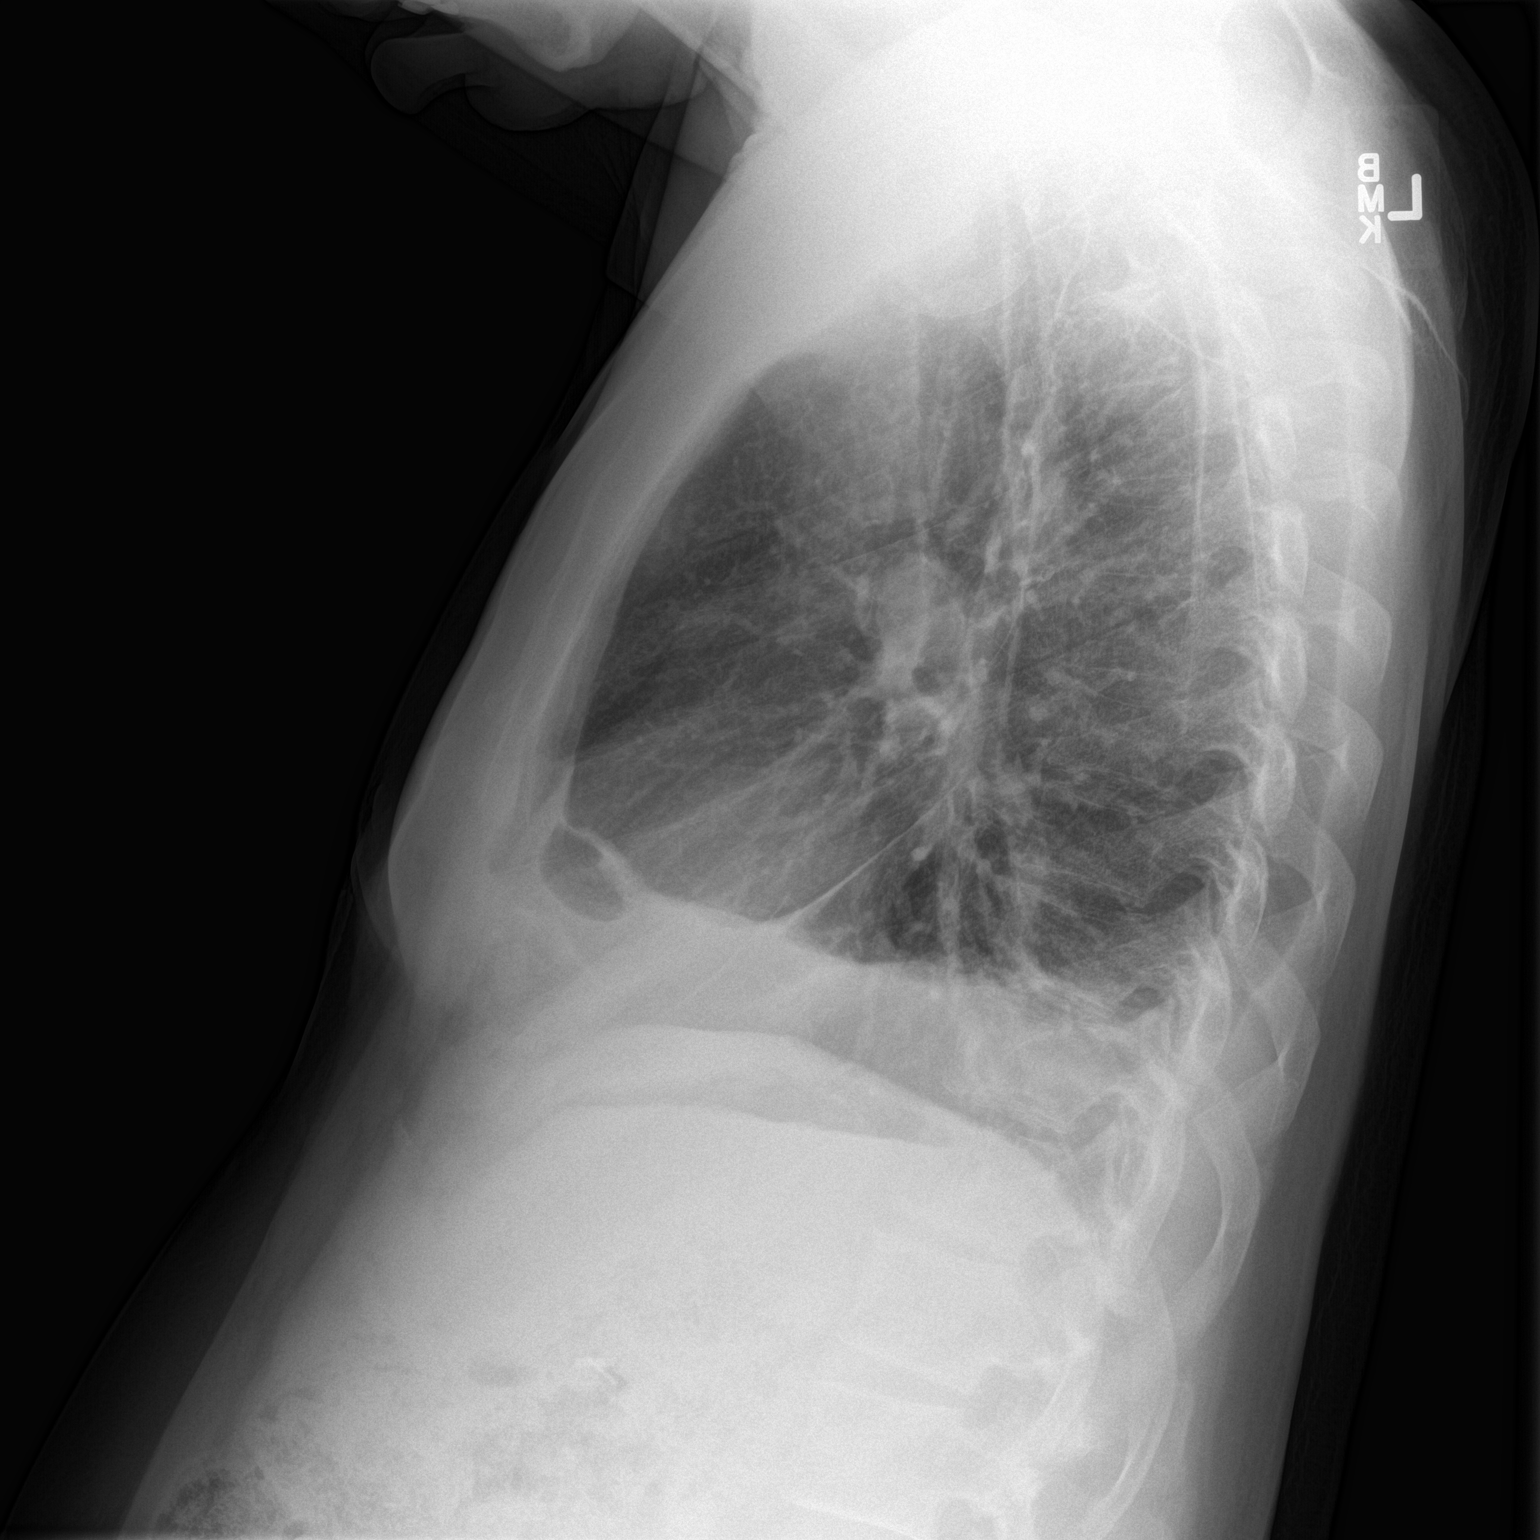

[2 of 2 positions shown; findings below may reference images not displayed]

FINDINGS: Normal heart size and pulmonary vascularity. Small right pleural
effusion with basilar atelectasis. Left lung is clear. No
pneumothorax. Calcification of the aorta.
IMPRESSION: Small right pleural effusion with atelectasis in the right lung
base.

## 2016-12-06 ENCOUNTER — Encounter: Payer: Self-pay | Admitting: Emergency Medicine

## 2016-12-06 DIAGNOSIS — Z794 Long term (current) use of insulin: Secondary | ICD-10-CM | POA: Diagnosis not present

## 2016-12-06 DIAGNOSIS — F17210 Nicotine dependence, cigarettes, uncomplicated: Secondary | ICD-10-CM | POA: Diagnosis not present

## 2016-12-06 DIAGNOSIS — F209 Schizophrenia, unspecified: Secondary | ICD-10-CM | POA: Insufficient documentation

## 2016-12-06 DIAGNOSIS — I10 Essential (primary) hypertension: Secondary | ICD-10-CM | POA: Diagnosis not present

## 2016-12-06 DIAGNOSIS — E1165 Type 2 diabetes mellitus with hyperglycemia: Secondary | ICD-10-CM | POA: Insufficient documentation

## 2016-12-06 LAB — BASIC METABOLIC PANEL
ANION GAP: 5 (ref 5–15)
BUN: 22 mg/dL — ABNORMAL HIGH (ref 6–20)
CALCIUM: 9 mg/dL (ref 8.9–10.3)
CO2: 30 mmol/L (ref 22–32)
Chloride: 101 mmol/L (ref 101–111)
Creatinine, Ser: 1.04 mg/dL (ref 0.61–1.24)
GLUCOSE: 349 mg/dL — AB (ref 65–99)
POTASSIUM: 4.5 mmol/L (ref 3.5–5.1)
Sodium: 136 mmol/L (ref 135–145)

## 2016-12-06 LAB — CBC
HEMATOCRIT: 38.5 % — AB (ref 40.0–52.0)
HEMOGLOBIN: 13 g/dL (ref 13.0–18.0)
MCH: 29.1 pg (ref 26.0–34.0)
MCHC: 33.7 g/dL (ref 32.0–36.0)
MCV: 86.5 fL (ref 80.0–100.0)
Platelets: 172 10*3/uL (ref 150–440)
RBC: 4.45 MIL/uL (ref 4.40–5.90)
RDW: 16.2 % — ABNORMAL HIGH (ref 11.5–14.5)
WBC: 7.7 10*3/uL (ref 3.8–10.6)

## 2016-12-06 NOTE — ED Triage Notes (Signed)
Patient brought in by group home for hyperglycemia. Patient is a type 1 DM and had a fsbs of 402 at 21:20.

## 2016-12-07 ENCOUNTER — Emergency Department
Admission: EM | Admit: 2016-12-07 | Discharge: 2016-12-07 | Disposition: A | Discharge status: Home / Self Care | Payer: Medicare Other | Attending: Emergency Medicine | Admitting: Emergency Medicine

## 2016-12-07 DIAGNOSIS — R739 Hyperglycemia, unspecified: Secondary | ICD-10-CM

## 2016-12-07 DIAGNOSIS — E1165 Type 2 diabetes mellitus with hyperglycemia: Secondary | ICD-10-CM | POA: Diagnosis not present

## 2016-12-07 LAB — URINALYSIS, COMPLETE (UACMP) WITH MICROSCOPIC
Bacteria, UA: NONE SEEN
Bilirubin Urine: NEGATIVE
HGB URINE DIPSTICK: NEGATIVE
Ketones, ur: NEGATIVE mg/dL
Leukocytes, UA: NEGATIVE
Nitrite: NEGATIVE
PH: 6 (ref 5.0–8.0)
Protein, ur: NEGATIVE mg/dL
RBC / HPF: NONE SEEN RBC/hpf (ref 0–5)
SPECIFIC GRAVITY, URINE: 1.026 (ref 1.005–1.030)
Squamous Epithelial / LPF: NONE SEEN

## 2016-12-07 LAB — GLUCOSE, CAPILLARY
GLUCOSE-CAPILLARY: 311 mg/dL — AB (ref 65–99)
GLUCOSE-CAPILLARY: 334 mg/dL — AB (ref 65–99)
Glucose-Capillary: 86 mg/dL (ref 65–99)

## 2016-12-07 MED ORDER — SODIUM CHLORIDE 0.9 % IV BOLUS (SEPSIS)
1000.0000 mL | Freq: Once | INTRAVENOUS | Status: AC
  Administered 2016-12-07: 1000 mL via INTRAVENOUS

## 2016-12-07 NOTE — ED Notes (Addendum)
Pt presents [facility] ED [facility] with elevated blood sugar for the past 2 days. Pt arrived to ED with his ACT team member.  Pt states he has been eating per his norm and has been taking his medications as prescribed. Pt alert and calm at this time. Denies pain, sob, confusion. Answering questions without difficulty. Pt asking for an ice cream or some other snack while waiting. Pt told he would have to wait until the MD sees him and approves before he will be able to get anything to eat or drink.

## 2016-12-07 NOTE — ED Provider Notes (Signed)
[facility] Emergency Department Provider Note  Time seen 2:30 AM  I have reviewed the triage vital signs and the nursing notes.   HISTORY  Chief Complaint Hyperglycemia    HPI Johnny [name] is a 61 y.o. male with below list of chronic medical conditions including type 1 diabetes presents to the emergency department with hyperglycemia. Patient's glucose was 402 at 9:20 PM last night. Patient denies any symptoms recent illness no cough congestion vomiting or diarrhea. Patient denies any fever.   Past Medical History:  Diagnosis Date  . Anemia   . Clostridium difficile colitis   . Diabetes mellitus without complication (HCC)   . DM (diabetes mellitus) (HCC)   . GERD (gastroesophageal reflux disease)   . Herpes   . Hyperlipidemia   . Hypertension   . Schizophrenia [facility](HCC)     Patient Active Problem List   Diagnosis Date Noted  . Acute renal failure (ARF) (HCC) 07/05/2015  . SINUS TACHYCARDIA 06/10/2008  . HYPOTENSION, ORTHOSTATIC 06/10/2008  . DIABETES MELLITUS, CONTROLLED, WITHOUT COMPLICATIONS 05/19/2008  . HYPOGLYCEMIA 04/27/2008  . HYPERLIPIDEMIA 01/22/2008  . SCHIZOPHRENIA 01/22/2008  . ANXIETY 01/22/2008  . DEPRESSION 01/22/2008  . HYPERTENSION 01/22/2008  . MYOCARDIAL INFARCTION, HX OF 01/22/2008  . BRONCHITIS, CHRONIC 01/22/2008  . GERD 01/22/2008    Past Surgical History:  Procedure Laterality Date  . CHOLECYSTECTOMY      Prior to Admission medications   Medication Sig Start Date End Date Taking? Authorizing Provider  albuterol (PROVENTIL HFA;VENTOLIN HFA) 108 (90 BASE) MCG/ACT inhaler Inhale 2 puffs into the lungs every 4 (four) hours as needed for wheezing or shortness of breath.    Historical Provider, MD  ammonium lactate (AMLACTIN) 12 % cream Apply 1 g topically daily.     Historical Provider, MD  aspirin 81 MG chewable tablet Chew 81 mg by mouth daily.     Historical Provider, MD  benazepril-hydrochlorthiazide (LOTENSIN  HCT) 20-12.5 MG per tablet Take 1 tablet by mouth daily.    Historical Provider, MD  benztropine (COGENTIN) 1 MG tablet Take 1 mg by mouth 2 (two) times daily.    Historical Provider, MD  Canagliflozin-Metformin HCl (INVOKAMET) 50-1000 MG TABS Take 1 tablet by mouth 2 (two) times daily with a meal.    Historical Provider, MD  clonazePAM (KLONOPIN) 1 MG tablet Take 1 mg by mouth 3 (three) times daily.    Historical Provider, MD  colesevelam (WELCHOL) 625 MG tablet Take 1,875 mg by mouth 2 (two) times daily.     Historical Provider, MD  divalproex (DEPAKOTE ER) 500 MG 24 hr tablet Take 500 mg by mouth 2 (two) times daily.     Historical Provider, MD  fluPHENAZine (PROLIXIN) 5 MG tablet Take 5 mg by mouth 2 (two) times daily. Pt also takes daily as needed for psychosis/agitation.    Historical Provider, MD  folic acid (FOLVITE) 1 MG tablet Take 1 mg by mouth daily.    Historical Provider, MD  insulin glargine (LANTUS) 100 UNIT/ML injection Inject 30 Units into the skin at bedtime.    Historical Provider, MD  lubiprostone (AMITIZA) 24 MCG capsule Take 24 mcg by mouth 2 (two) times daily.     Historical Provider, MD  Memantine HCl-Donepezil HCl (NAMZARIC) 28-10 MG CP24 Take 1 capsule by mouth at bedtime.     Historical Provider, MD  omeprazole (PRILOSEC) 20 MG capsule Take 20 mg by mouth daily.    Historical Provider, MD  ondansetron (ZOFRAN ODT) 8 MG  disintegrating tablet Take 1 tablet (8 mg total) by mouth every 8 (eight) hours as needed for nausea or vomiting. 06/29/15   Sharman Cheek, MD  paliperidone (INVEGA SUSTENNA) 156 MG/ML SUSP injection Inject 156 mg into the muscle every 30 (thirty) days.    Historical Provider, MD  pioglitazone (ACTOS) 45 MG tablet Take 45 mg by mouth daily.    Historical Provider, MD  polyethylene glycol (MIRALAX / GLYCOLAX) packet Take 17 g by mouth daily.    Historical Provider, MD  pravastatin (PRAVACHOL) 40 MG tablet Take 40 mg by mouth at bedtime.     Historical  Provider, MD  QUEtiapine (SEROQUEL) 300 MG tablet Take 300 mg by mouth daily.    Historical Provider, MD  QUEtiapine (SEROQUEL) 400 MG tablet Take 600 mg by mouth at bedtime.    Historical Provider, MD  ranitidine (ZANTAC) 150 MG capsule Take 1 capsule (150 mg total) by mouth 2 (two) times daily. 06/29/15   Sharman Cheek, MD  sitaGLIPtin (JANUVIA) 100 MG tablet Take 100 mg by mouth daily.    Historical Provider, MD  tiotropium (SPIRIVA) 18 MCG inhalation capsule Place 18 mcg into inhaler and inhale daily.    Historical Provider, MD  traZODone (DESYREL) 100 MG tablet Take 200 mg by mouth at bedtime.    Historical Provider, MD  Vitamin D, Ergocalciferol, (DRISDOL) 50000 UNITS CAPS capsule Take 50,000 Units by mouth every 7 (seven) days.    Historical Provider, MD    Allergies Fish allergy and Shellfish allergy  Family History  Problem Relation Age of Onset  . Diabetes Mother   . Kidney failure Mother   . Diabetes Father   . CAD Brother     Social History Social History  Substance Use Topics  . Smoking status: Current Every Day Smoker    Packs/day: 1.00    Types: Cigarettes  . Smokeless tobacco: Never Used  . Alcohol use No    Review of Systems Constitutional: No fever/chills Eyes: No visual changes. ENT: No sore throat. Cardiovascular: Denies chest pain. Respiratory: Denies shortness of breath. Gastrointestinal: No abdominal pain.  No nausea, no vomiting.  No diarrhea.  No constipation. Genitourinary: Negative for dysuria. Musculoskeletal: Negative for back pain. Skin: Negative for rash. Neurological: Negative for headaches, focal weakness or numbness. Endocrine:Positive for hyperglycemia  10-point ROS otherwise negative.  ____________________________________________   PHYSICAL EXAM:  VITAL SIGNS: ED Triage Vitals  Enc Vitals Group     BP 12/06/16 2252 133/64     Pulse Rate 12/06/16 2252 83     Resp 12/06/16 2252 18     Temp 12/06/16 2252 98.6 F (37 C)      Temp Source 12/06/16 2252 Oral     SpO2 12/06/16 2252 100 %     Weight 12/06/16 2249 135 lb (61.2 kg)     Height 12/06/16 2249 5\' 5"  (1.651 m)     Head Circumference --      Peak Flow --      Pain Score 12/06/16 2248 8     Pain Loc --      Pain Edu? --      Excl. in GC? --     Constitutional: Alert and oriented. Well appearing and in no acute distress. Eyes: Conjunctivae are normal. PERRL. EOMI. Head: Atraumatic. Mouth/Throat: Mucous membranes are moist.  Oropharynx non-erythematous. Neck: No stridor.  Cardiovascular: Normal rate, regular rhythm. Good peripheral circulation. Grossly normal heart sounds. Respiratory: Normal respiratory effort.  No retractions. Lungs CTAB. Gastrointestinal: Soft  and nontender. No distention.  Musculoskeletal: No lower extremity tenderness nor edema. No gross deformities of extremities. Neurologic:  Normal speech and language. No gross focal neurologic deficits are appreciated.  Skin:  Skin is warm, dry and intact. No rash noted.   ____________________________________________   LABS (all labs ordered are listed, but only abnormal results are displayed)  Labs Reviewed  BASIC METABOLIC PANEL - Abnormal; Notable for the following:       Result Value   Glucose, Bld 349 (*)    BUN 22 (*)    All other components within normal limits  CBC - Abnormal; Notable for the following:    HCT 38.5 (*)    RDW 16.2 (*)    All other components within normal limits  URINALYSIS, COMPLETE (UACMP) WITH MICROSCOPIC - Abnormal; Notable for the following:    Color, Urine STRAW (*)    APPearance CLEAR (*)    Glucose, UA >=500 (*)    All other components within normal limits  GLUCOSE, CAPILLARY  CBG MONITORING, ED     Procedures   ____________________________________________   INITIAL IMPRESSION / ASSESSMENT AND PLAN / ED COURSE  Pertinent labs & imaging results that were available during my care of the patient were reviewed by me and considered in my  medical decision making (see chart for details).  Patient received 1 L IV normal saline repeat glucose 86 at 4:06 AM      ____________________________________________  FINAL CLINICAL IMPRESSION(S) / ED DIAGNOSES  Final diagnoses:  Hyperglycemia     MEDICATIONS GIVEN DURING THIS VISIT:  Medications - No data to display   NEW OUTPATIENT MEDICATIONS STARTED DURING THIS VISIT:  New Prescriptions   No medications on file    Modified Medications   No medications on file    Discontinued Medications   No medications on file     Note:  This document was prepared using Dragon voice recognition software and may include unintentional dictation errors.    Darci Current, MD 12/07/16 [phone]

## 2016-12-07 NOTE — ED Notes (Signed)
Dr. Brown at the bedside for pt evaluation 

## 2020-06-14 ENCOUNTER — Other Ambulatory Visit: Payer: Self-pay

## 2020-06-14 ENCOUNTER — Emergency Department: Payer: Medicare Other

## 2020-06-14 DIAGNOSIS — Z20822 Contact with and (suspected) exposure to covid-19: Secondary | ICD-10-CM | POA: Insufficient documentation

## 2020-06-14 DIAGNOSIS — I10 Essential (primary) hypertension: Secondary | ICD-10-CM | POA: Insufficient documentation

## 2020-06-14 DIAGNOSIS — F17210 Nicotine dependence, cigarettes, uncomplicated: Secondary | ICD-10-CM | POA: Insufficient documentation

## 2020-06-14 DIAGNOSIS — E785 Hyperlipidemia, unspecified: Secondary | ICD-10-CM | POA: Insufficient documentation

## 2020-06-14 DIAGNOSIS — Z79899 Other long term (current) drug therapy: Secondary | ICD-10-CM | POA: Insufficient documentation

## 2020-06-14 DIAGNOSIS — Z7984 Long term (current) use of oral hypoglycemic drugs: Secondary | ICD-10-CM | POA: Insufficient documentation

## 2020-06-14 DIAGNOSIS — J181 Lobar pneumonia, unspecified organism: Secondary | ICD-10-CM | POA: Insufficient documentation

## 2020-06-14 DIAGNOSIS — E1169 Type 2 diabetes mellitus with other specified complication: Secondary | ICD-10-CM | POA: Insufficient documentation

## 2020-06-14 DIAGNOSIS — Z794 Long term (current) use of insulin: Secondary | ICD-10-CM | POA: Insufficient documentation

## 2020-06-14 DIAGNOSIS — Z7982 Long term (current) use of aspirin: Secondary | ICD-10-CM | POA: Insufficient documentation

## 2020-06-14 DIAGNOSIS — R7881 Bacteremia: Secondary | ICD-10-CM | POA: Diagnosis not present

## 2020-06-14 LAB — BASIC METABOLIC PANEL
Anion gap: 13 (ref 5–15)
BUN: 53 mg/dL — ABNORMAL HIGH (ref 8–23)
CO2: 25 mmol/L (ref 22–32)
Calcium: 9.4 mg/dL (ref 8.9–10.3)
Chloride: 93 mmol/L — ABNORMAL LOW (ref 98–111)
Creatinine, Ser: 1.35 mg/dL — ABNORMAL HIGH (ref 0.61–1.24)
GFR calc Af Amer: >60 mL/min (ref >60)
GFR calc non Af Amer: 56 mL/min — ABNORMAL LOW (ref >60)
Glucose, Bld: 193 mg/dL — ABNORMAL HIGH (ref 70–99)
Potassium: 4.8 mmol/L (ref 3.5–5.1)
Sodium: 131 mmol/L — ABNORMAL LOW (ref 135–145)

## 2020-06-14 LAB — CBC
HCT: 42.4 % (ref 39.0–52.0)
Hemoglobin: 15 g/dL (ref 13.0–17.0)
MCH: 28.9 pg (ref 26.0–34.0)
MCHC: 35.4 g/dL (ref 30.0–36.0)
MCV: 81.7 fL (ref 80.0–100.0)
Platelets: 267 10*3/uL (ref 150–400)
RBC: 5.19 MIL/uL (ref 4.22–5.81)
RDW: 17.1 % — ABNORMAL HIGH (ref 11.5–15.5)
WBC: 15.5 10*3/uL — ABNORMAL HIGH (ref 4.0–10.5)
nRBC: 0 % (ref 0.0–0.2)

## 2020-06-14 LAB — TROPONIN I (HIGH SENSITIVITY)
Troponin I (High Sensitivity): 10 ng/L (ref <18)
Troponin I (High Sensitivity): 9 ng/L (ref <18)

## 2020-06-14 NOTE — ED Triage Notes (Signed)
First Nurse Note:  Arrives with c/o N/V/D x 24 hours.  Also c/o 'feeling funny' in his chest.  AAOx3.  Skin warm and dry. NAD

## 2020-06-14 NOTE — ED Triage Notes (Signed)
Pt comes via POV from home with c/o central substernal CP. Pt states this started yesterday. Pt denies any SOB.  Pt states he was vomiting when the pain started. Pt states 8/10 pain. Pt states radiation to back of hip.

## 2020-06-15 ENCOUNTER — Encounter: Payer: Self-pay | Admitting: Radiology

## 2020-06-15 ENCOUNTER — Emergency Department
Admission: EM | Admit: 2020-06-15 | Discharge: 2020-06-15 | Disposition: A | Discharge status: Home / Self Care | Payer: Medicare Other | Source: Home / Self Care | Attending: Emergency Medicine | Admitting: Emergency Medicine

## 2020-06-15 ENCOUNTER — Emergency Department: Payer: Medicare Other

## 2020-06-15 DIAGNOSIS — J189 Pneumonia, unspecified organism: Secondary | ICD-10-CM

## 2020-06-15 DIAGNOSIS — R0789 Other chest pain: Secondary | ICD-10-CM

## 2020-06-15 LAB — BLOOD CULTURE ID PANEL (REFLEXED) - BCID2

## 2020-06-15 LAB — LACTIC ACID, PLASMA: Lactic Acid, Venous: 1.9 mmol/L (ref 0.5–1.9)

## 2020-06-15 LAB — RESPIRATORY PANEL BY RT PCR (FLU A&B, COVID)
Influenza A by PCR: NEGATIVE
Influenza B by PCR: NEGATIVE
SARS Coronavirus 2 by RT PCR: NEGATIVE

## 2020-06-15 MED ORDER — AZITHROMYCIN 250 MG PO TABS: ORAL_TABLET | ORAL | 0 refills | Status: DC

## 2020-06-15 MED ORDER — IOHEXOL 350 MG/ML SOLN
100.0000 mL | Freq: Once | INTRAVENOUS | Status: AC | PRN
  Administered 2020-06-15: 100 mL via INTRAVENOUS

## 2020-06-15 MED ORDER — SODIUM CHLORIDE 0.9 % IV SOLN
500.0000 mg | INTRAVENOUS | Status: DC
  Administered 2020-06-15: 500 mg via INTRAVENOUS
  Filled 2020-06-15: qty 500

## 2020-06-15 MED ORDER — LACTATED RINGERS IV BOLUS
1000.0000 mL | Freq: Once | INTRAVENOUS | Status: AC
  Administered 2020-06-15: 1000 mL via INTRAVENOUS

## 2020-06-15 MED ORDER — SODIUM CHLORIDE 0.9 % IV SOLN
2.0000 g | INTRAVENOUS | Status: DC
  Administered 2020-06-15: 2 g via INTRAVENOUS
  Filled 2020-06-15: qty 20

## 2020-06-15 NOTE — ED Provider Notes (Signed)
[facility] Emergency Department Provider Note  ____________________________________________   First MD Initiated Contact with Patient 06/15/20 0244     (approximate)  I have reviewed the triage vital signs and the nursing notes.   HISTORY  Chief Complaint Chest Pain  Level 5 caveat: The patient has schizophrenia and lives in a group home.  He has a representative from the group home here with him tonight.  His chronic mental illness may limit the reliability and detail of his history.  HPI Johnny GivensJohnnie L Dunklee is a 64 y.o. male with medical and psychiatric history as listed below who presents for evaluation of 2 days of sharp substernal and slightly right-sided chest pain.  No shortness of breath, occasional cough.  He is vaccinated for COVID-19.  He has not had any fever, sore throat, loss of smell or taste, or abdominal pain, but he has had some nausea and vomiting.  He smokes tobacco.  Nothing in particular seems to make the symptoms better or worse.           Past Medical History:  Diagnosis Date  . Anemia   . Clostridium difficile colitis   . Diabetes mellitus without complication (HCC)   . DM (diabetes mellitus) (HCC)   . GERD (gastroesophageal reflux disease)   . Herpes   . Hyperlipidemia   . Hypertension   . Schizophrenia [facility](HCC)     Patient Active Problem List   Diagnosis Date Noted  . Acute renal failure (ARF) (HCC) 07/05/2015  . SINUS TACHYCARDIA 06/10/2008  . HYPOTENSION, ORTHOSTATIC 06/10/2008  . DIABETES MELLITUS, CONTROLLED, WITHOUT COMPLICATIONS 05/19/2008  . HYPOGLYCEMIA 04/27/2008  . HYPERLIPIDEMIA 01/22/2008  . SCHIZOPHRENIA 01/22/2008  . ANXIETY 01/22/2008  . DEPRESSION 01/22/2008  . HYPERTENSION 01/22/2008  . MYOCARDIAL INFARCTION, HX OF 01/22/2008  . BRONCHITIS, CHRONIC 01/22/2008  . GERD 01/22/2008    Past Surgical History:  Procedure Laterality Date  . CHOLECYSTECTOMY      Prior to Admission medications     Medication Sig Start Date End Date Taking? Authorizing Provider  albuterol (PROVENTIL HFA;VENTOLIN HFA) 108 (90 BASE) MCG/ACT inhaler Inhale 2 puffs into the lungs every 4 (four) hours as needed for wheezing or shortness of breath.    [provider]  ammonium lactate (AMLACTIN) 12 % cream Apply 1 g topically daily.     [provider]  aspirin 81 MG chewable tablet Chew 81 mg by mouth daily.     [provider]  azithromycin (ZITHROMAX) 250 MG tablet Take 1 tablet PO daily for 4 more days starting the day after your visit to the Emergency Department 06/15/20   Loleta RoseForbach, Decklyn Hyder, MD  benazepril-hydrochlorthiazide (LOTENSIN HCT) 20-12.5 MG per tablet Take 1 tablet by mouth daily.    [provider]  benztropine (COGENTIN) 1 MG tablet Take 1 mg by mouth 2 (two) times daily.    [provider]  Canagliflozin-Metformin HCl (INVOKAMET) 50-1000 MG TABS Take 1 tablet by mouth 2 (two) times daily with a meal.    [provider]  clonazePAM (KLONOPIN) 1 MG tablet Take 1 mg by mouth 3 (three) times daily.    [provider]  colesevelam (WELCHOL) 625 MG tablet Take 1,875 mg by mouth 2 (two) times daily.     [provider]  divalproex (DEPAKOTE ER) 500 MG 24 hr tablet Take 500 mg by mouth 2 (two) times daily.     [provider]  fluPHENAZine (PROLIXIN) 5 MG tablet Take 5 mg by mouth  2 (two) times daily. Pt also takes daily as needed for psychosis/agitation.    [provider]  folic acid (FOLVITE) 1 MG tablet Take 1 mg by mouth daily.    [provider]  insulin glargine (LANTUS) 100 UNIT/ML injection Inject 30 Units into the skin at bedtime.    [provider]  lubiprostone (AMITIZA) 24 MCG capsule Take 24 mcg by mouth 2 (two) times daily.     [provider]  Memantine HCl-Donepezil HCl (NAMZARIC) 28-10 MG CP24 Take 1 capsule by mouth at bedtime.     [provider]  omeprazole  (PRILOSEC) 20 MG capsule Take 20 mg by mouth daily.    [provider]  ondansetron (ZOFRAN ODT) 8 MG disintegrating tablet Take 1 tablet (8 mg total) by mouth every 8 (eight) hours as needed for nausea or vomiting. 06/29/15   Sharman Cheek, MD  paliperidone (INVEGA SUSTENNA) 156 MG/ML SUSP injection Inject 156 mg into the muscle every 30 (thirty) days.    [provider]  pioglitazone (ACTOS) 45 MG tablet Take 45 mg by mouth daily.    [provider]  polyethylene glycol (MIRALAX / GLYCOLAX) packet Take 17 g by mouth daily.    [provider]  pravastatin (PRAVACHOL) 40 MG tablet Take 40 mg by mouth at bedtime.     [provider]  QUEtiapine (SEROQUEL) 300 MG tablet Take 300 mg by mouth daily.    [provider]  QUEtiapine (SEROQUEL) 400 MG tablet Take 600 mg by mouth at bedtime.    [provider]  ranitidine (ZANTAC) 150 MG capsule Take 1 capsule (150 mg total) by mouth 2 (two) times daily. 06/29/15   Sharman Cheek, MD  sitaGLIPtin (JANUVIA) 100 MG tablet Take 100 mg by mouth daily.    [provider]  tiotropium (SPIRIVA) 18 MCG inhalation capsule Place 18 mcg into inhaler and inhale daily.    [provider]  traZODone (DESYREL) 100 MG tablet Take 200 mg by mouth at bedtime.    [provider]  Vitamin D, Ergocalciferol, (DRISDOL) 50000 UNITS CAPS capsule Take 50,000 Units by mouth every 7 (seven) days.    [provider]    Allergies Fish allergy and Shellfish allergy  Family History  Problem Relation Age of Onset  . Diabetes Mother   . Kidney failure Mother   . Diabetes Father   . CAD Brother     Social History Social History   Tobacco Use  . Smoking status: Current Every Day Smoker    Packs/day: 1.00    Types: Cigarettes  . Smokeless tobacco: Never Used  Substance Use Topics  . Alcohol use: No  . Drug use: No    Review of Systems Constitutional: No  fever/chills Eyes: No visual changes. ENT: No sore throat. Cardiovascular: +chest pain. Respiratory: Denies shortness of breath.  Some cough. Gastrointestinal: No abdominal pain.  +nausea/vomiting.  No diarrhea.  No constipation. Genitourinary: Negative for dysuria. Musculoskeletal: Negative for neck pain.  Negative for back pain. Integumentary: Negative for rash. Neurological: Negative for headaches, focal weakness or numbness.   ____________________________________________   PHYSICAL EXAM:  VITAL SIGNS: ED Triage Vitals  Enc Vitals Group     BP 06/14/20 1649 137/77     Pulse Rate 06/14/20 1649 (!) 113     Resp 06/14/20 1649 18     Temp 06/14/20 1649 99.5 F (37.5 C)     Temp Source 06/14/20 2048 Oral     SpO2  06/14/20 1649 95 %     Weight 06/14/20 1647 72.6 kg (160 lb)     Height 06/14/20 1647 1.829 m (6')     Head Circumference --      Peak Flow --      Pain Score 06/14/20 1647 8     Pain Loc --      Pain Edu? --      Excl. in GC? --     Constitutional: Alert and oriented to self and location. Eyes: Conjunctivae are normal.  Head: Atraumatic. Nose: No congestion/rhinnorhea. Mouth/Throat: Patient is wearing a mask. Neck: No stridor.  No meningeal signs.   Cardiovascular: Mild tachycardia, regular rhythm. Good peripheral circulation. Grossly normal heart sounds. Respiratory: Normal respiratory effort.  No retractions. Gastrointestinal: Soft and nontender. No distention.  Musculoskeletal: No lower extremity tenderness nor edema. No gross deformities of extremities. Neurologic:  Normal speech and language. No gross focal neurologic deficits are appreciated.  Skin:  Skin is warm, dry and intact. Psychiatric: Mood and affect are calm and cooperative.  ____________________________________________   LABS (all labs ordered are listed, but only abnormal results are displayed)  Labs Reviewed  BASIC METABOLIC PANEL - Abnormal; Notable for the following components:       Result Value   Sodium 131 (*)    Chloride 93 (*)    Glucose, Bld 193 (*)    BUN 53 (*)    Creatinine, Ser 1.35 (*)    GFR calc non Af Amer 56 (*)    All other components within normal limits  CBC - Abnormal; Notable for the following components:   WBC 15.5 (*)    RDW 17.1 (*)    All other components within normal limits  RESPIRATORY PANEL BY RT PCR (FLU A&B, COVID)  CULTURE, BLOOD (ROUTINE X 2)  CULTURE, BLOOD (ROUTINE X 2)  LACTIC ACID, PLASMA  LACTIC ACID, PLASMA  TROPONIN I (HIGH SENSITIVITY)  TROPONIN I (HIGH SENSITIVITY)   ____________________________________________  EKG  ED ECG REPORT I, Loleta Rose, the attending physician, personally viewed and interpreted this ECG.  Date: 06/14/2020 EKG Time: 16: 46 Rate: 114 Rhythm: Sinus tachycardia QRS Axis: normal Intervals: normal ST/T Wave abnormalities: Non-specific ST segment / T-wave changes, but no clear evidence of acute ischemia. Narrative Interpretation: no definitive evidence of acute ischemia; does not meet STEMI criteria.   ____________________________________________  RADIOLOGY I, Loleta Rose, personally viewed and evaluated these images (plain radiographs) as part of my medical decision making, as well as reviewing the written report by the radiologist.  ED MD interpretation: Concerning for developing right lower lobe infiltrate, possibly pleural-based.  CTA of the chest demonstrates no PE, +motion artifact, scarring in the RLL.  Official radiology report(s): DG Chest 2 View  Result Date: 06/14/2020 CLINICAL DATA:  Chest pain and shortness of breath EXAM: CHEST - 2 VIEW COMPARISON:  07/05/2015 FINDINGS: Cardiac shadow is within normal limits. Aortic calcifications are seen without aneurysmal dilatation. The lungs are well aerated bilaterally. Increased density is noted in the right lung base projecting in the right lower lobe along the posterior pleural margin. This likely represents focal infiltrate with  some associated small right-sided effusion. Follow-up films are recommended. No bony abnormality is seen. IMPRESSION: Pleural based density/infiltrate in the right lower lobe posteriorly. Follow-up exam is recommended following appropriate therapy to assess for resolution. Electronically Signed   By: Alcide Clever M.D.   On: 06/14/2020 17:42    ____________________________________________   PROCEDURES   Procedure(s) performed (including Critical  Care):  Marland Kitchen1-3 Lead EKG Interpretation Performed by: Loleta Rose, MD Authorized by: Loleta Rose, MD     Interpretation: abnormal     ECG rate:  113   ECG rate assessment: tachycardic     Rhythm: sinus tachycardia     Ectopy: none     Conduction: normal       ____________________________________________   INITIAL IMPRESSION / MDM / ASSESSMENT AND PLAN / ED COURSE  As part of my medical decision making, I reviewed the following data within the electronic MEDICAL RECORD NUMBER History obtained from family, Nursing notes reviewed and incorporated, Labs reviewed , EKG interpreted , Old chart reviewed, Radiograph reviewed  and Notes from prior ED visits   Differential diagnosis includes, but is not limited to, pneumonia, neoplasm, sepsis, PE, ACS.  The patient is on the cardiac monitor to evaluate for evidence of arrhythmia and/or significant heart rate changes.  The patient has been stable for 12 hours, the time he has been waiting due to overwhelming ED and hospital patient volumes and staffing limitations.  However he has been tachycardic throughout and is currently tachycardic at about 110-115.  He is in no distress and has been resting.  His chest x-ray (which I personally viewed and interpreted, along with reading the radiologist's report) is concerning for pleural-based infiltrate/effusion in the right lower lobe.  He is having pain and tachycardia and although he is not hypoxemic, I wonder about the possibility of PE.  I asked the patient  if he has ever had blood clots in his lungs and he immediately answered yes, but I think he may be a little bit confused about his history because I find no evidence that he has had a PE or DVT or that he has ever been on anticoagulation.  His lab work is notable for reassuring high-sensitivity troponins x2, leukocytosis of 15.5 which may support the pneumonia diagnosis, and a basic metabolic panel showing mild hyponatremia, slightly elevated BUN, and slightly elevated creatinine.  I suspect this represents volume depletion in the setting of acute illness and decreased oral intake.  I have ordered lactated Ringer's 1 L IV bolus and will begin empiric treatment of the pneumonia with ceftriaxone 2 g IV and azithromycin 500 mg IV as per community-acquired pneumonia recommendations.  He has mild tachycardia and a leukocytosis which meets SIRS criteria but he does not appear to be in severe sepsis or septic shock and it is unclear if he will require admission.  However I feel that given his comorbidities, concern for PE, and difficulty providing a thorough history, I will obtain a CTA chest for further evaluation of the infiltrates/effusion in his lung as well as to rule out pulmonary embolism.  I discussed this with the patient and with the representative from the group home and they both agree with the plan.  Lactic acid, blood cultures, and COVID-19 test are pending.     Clinical Course as of Jun 15 540  Tue Jun 15, 2020  4081 Lactic Acid, Venous: 1.9 [CF]  0504 CLINICAL DATA: Pneumonia  EXAM: CT ANGIOGRAPHY CHEST WITH CONTRAST  TECHNIQUE: Multidetector CT imaging of the chest was performed using the standard protocol during bolus administration of intravenous contrast. Multiplanar CT image reconstructions and MIPs were obtained to evaluate the vascular anatomy.  CONTRAST: OMNIPAQUE IOHEXOL 350 MG/ML SOLN  COMPARISON: Abdominal CT 11/15/2010  FINDINGS: Cardiovascular: Normal heart  size. No pericardial effusion. Limited CTA due to bolus dispersion and intermittent motion. No visible pulmonary  embolism. No acute aortic finding. There is aortic atherosclerosis.  Mediastinum/Nodes: Circumferential mid to lower esophageal thickening. Small volume a soft endoluminal fluid is present.  Lungs/Pleura: Pleural thickening with bands of opacity in the right lung both anteriorly and posteriorly. Posteriorly there is clear swirling of interstitium associated with the opacity, especially on sagittal reformats. Mild centrilobular emphysema. There is no edema, consolidation, effusion, or pneumothorax.  Upper Abdomen: Minimal coverage shows no acute finding. Where covered the stomach is fluid distended and there is changes of cholecystectomy. Partially covered left adrenal adenoma, also seen by abdominal CT in 2012, 3 cm where covered.  Musculoskeletal: No acute or aggressive finding.  Symmetric gynecomastia.  Review of the MIP images confirms the above findings.  IMPRESSION: 1. Limited CTA due to significant motion artifact and bolus dispersion. No visible pulmonary embolism. 2. Right-sided pleuroparenchymal scarring including round atelectasis. 3. Circumferential esophageal thickening in this patient with history of gastroesophageal reflux. 4. Aortic Atherosclerosis (ICD10-I70.0) and Emphysema (ICD10-J43.9).  Electronically Signed By: Marnee Spring M.D. On: 06/15/2020 04:42   [CF]  1610 Although the CTA seems to demonstrate more scarring than active infection, I will treat empirically with a 5-day course of azithromycin given that the patient is somewhat symptomatic and has an elevated white blood cell count.  I will talk with the patient and the person's group home about the importance of close outpatient follow-up.  There is no evidence of an emergent medical condition that would keep the patient hospitalized at this time.   [CF]  [phone] Patient and group home rep agree  with the plan.  Patient will finish fluids given his mild dehydration, finish empiric antibiotics, and then be discharged.   [CF]    Clinical Course User Index [CF] Loleta Rose, MD     ____________________________________________  FINAL CLINICAL IMPRESSION(S) / ED DIAGNOSES  Final diagnoses:  Atypical chest pain  Community acquired pneumonia of right lower lobe of lung     MEDICATIONS GIVEN DURING THIS VISIT:  Medications  cefTRIAXone (ROCEPHIN) 2 g in sodium chloride 0.9 % 100 mL IVPB (2 g Intravenous New Bag/Given 06/15/20 0404)  azithromycin (ZITHROMAX) 500 mg in sodium chloride 0.9 % 250 mL IVPB (500 mg Intravenous New Bag/Given 06/15/20 0412)  lactated ringers bolus 1,000 mL (1,000 mLs Intravenous New Bag/Given 06/15/20 0411)  iohexol (OMNIPAQUE) 350 MG/ML injection 100 mL (100 mLs Intravenous Contrast Given 06/15/20 0428)     ED Discharge Orders         Ordered    azithromycin (ZITHROMAX) 250 MG tablet        06/15/20 0515          *Please note:  Johnny [name] was evaluated in Emergency Department on 06/15/2020 for the symptoms described in the history of present illness. He was evaluated in the context of the global COVID-19 pandemic, which necessitated consideration that the patient might be at risk for infection with the SARS-CoV-2 virus that causes COVID-19. Institutional protocols and algorithms that pertain to the evaluation of patients at risk for COVID-19 are in a state of rapid change based on information released by regulatory bodies including the CDC and federal and state organizations. These policies and algorithms were followed during the patient's care in the ED.  Some ED evaluations and interventions may be delayed as a result of limited staffing during and after the pandemic.*  Note:  This document was prepared using Dragon voice recognition software and may include unintentional dictation errors.   Loleta Rose, MD 06/15/20 [phone]

## 2020-06-15 NOTE — Discharge Instructions (Addendum)
Generally speaking your work-up was reassuring today.  You may have a small area of infection (pneumonia) in the right lower lobe of your lungs.  Please take the course of antibiotics prescribed as well as all of your regular medications.  We believe you are a little bit dehydrated as well and we gave you IV fluids, so please remember to drink plenty of fluids at home even if you do not have too much of an appetite.  Your COVID-19 test was negative today.  Please follow-up with your primary care doctor within a couple of days if possible, or at the next available opportunity.  Return to the emergency department if you develop new or worsening symptoms that concern you.

## 2020-06-16 NOTE — ED Notes (Signed)
Attempted to call pt after consulting with Dr. Manson Passey about + staph in 1 of 4 blood cultures. Pt did not answer and no voicemail was set up. Will continue to try calling patient.

## 2020-06-16 NOTE — Progress Notes (Signed)
PHARMACY - PHYSICIAN COMMUNICATION CRITICAL VALUE ALERT - BLOOD CULTURE IDENTIFICATION (BCID)  Johnny [name] is an 64 y.o. male who presented to [facility] on 06/15/2020 with a chief complaint of Chest pain/PNA   Assessment:  Staph species growing in 1 of 4 bottles (aerobic) no resistance detected.   (include suspected source if known)  Name of physician (or Provider) Contacted: Pt seen by York Cerise on 10/05 but discharged later that AM.    Results passed off to [facility] (ED charge RN) who communicated results to Dr Manson Passey.   Current antibiotics: Pt was discharged on azithromycin 250 mg PO daily   Changes to prescribed antibiotics recommended:  Suggested adding cephalexin 500 mg PO QID to outpt regimen.    MD plans to have pt return to hospital for readmission ; will probably restart ceftriaxone, azithromycin.   Results for orders placed or performed during the hospital encounter of 06/15/20  Blood Culture ID Panel (Reflexed) (Collected: 06/15/2020  4:03 AM)  Result Value Ref Range   Enterococcus faecalis NOT DETECTED NOT DETECTED   Enterococcus Faecium NOT DETECTED NOT DETECTED   Listeria monocytogenes NOT DETECTED NOT DETECTED   Staphylococcus species DETECTED (A) NOT DETECTED   Staphylococcus aureus (BCID) NOT DETECTED NOT DETECTED   Staphylococcus epidermidis NOT DETECTED NOT DETECTED   Staphylococcus lugdunensis NOT DETECTED NOT DETECTED   Streptococcus species NOT DETECTED NOT DETECTED   Streptococcus agalactiae NOT DETECTED NOT DETECTED   Streptococcus pneumoniae NOT DETECTED NOT DETECTED   Streptococcus pyogenes NOT DETECTED NOT DETECTED   A.calcoaceticus-baumannii NOT DETECTED NOT DETECTED   Bacteroides fragilis NOT DETECTED NOT DETECTED   Enterobacterales NOT DETECTED NOT DETECTED   Enterobacter cloacae complex NOT DETECTED NOT DETECTED   Escherichia coli NOT DETECTED NOT DETECTED   Klebsiella aerogenes NOT DETECTED NOT DETECTED   Klebsiella oxytoca NOT DETECTED NOT DETECTED    Klebsiella pneumoniae NOT DETECTED NOT DETECTED   Proteus species NOT DETECTED NOT DETECTED   Salmonella species NOT DETECTED NOT DETECTED   Serratia marcescens NOT DETECTED NOT DETECTED   Haemophilus influenzae NOT DETECTED NOT DETECTED   Neisseria meningitidis NOT DETECTED NOT DETECTED   Pseudomonas aeruginosa NOT DETECTED NOT DETECTED   Stenotrophomonas maltophilia NOT DETECTED NOT DETECTED   Candida albicans NOT DETECTED NOT DETECTED   Candida auris NOT DETECTED NOT DETECTED   Candida glabrata NOT DETECTED NOT DETECTED   Candida krusei NOT DETECTED NOT DETECTED   Candida parapsilosis NOT DETECTED NOT DETECTED   Candida tropicalis NOT DETECTED NOT DETECTED   Cryptococcus neoformans/gattii NOT DETECTED NOT DETECTED    Anjelita Sheahan D 06/16/2020  3:43 AM

## 2020-06-17 ENCOUNTER — Inpatient Hospital Stay
Admission: EM | Admit: 2020-06-17 | Discharge: 2020-06-21 | DRG: 871 | Disposition: A | Discharge status: Home / Self Care | Payer: Medicare Other | Attending: Internal Medicine | Admitting: Internal Medicine

## 2020-06-17 ENCOUNTER — Emergency Department: Payer: Medicare Other

## 2020-06-17 ENCOUNTER — Other Ambulatory Visit: Payer: Self-pay

## 2020-06-17 ENCOUNTER — Encounter: Payer: Self-pay | Admitting: Emergency Medicine

## 2020-06-17 DIAGNOSIS — F17210 Nicotine dependence, cigarettes, uncomplicated: Secondary | ICD-10-CM | POA: Diagnosis present

## 2020-06-17 DIAGNOSIS — E1169 Type 2 diabetes mellitus with other specified complication: Secondary | ICD-10-CM | POA: Diagnosis present

## 2020-06-17 DIAGNOSIS — Z841 Family history of disorders of kidney and ureter: Secondary | ICD-10-CM

## 2020-06-17 DIAGNOSIS — Z7982 Long term (current) use of aspirin: Secondary | ICD-10-CM | POA: Diagnosis not present

## 2020-06-17 DIAGNOSIS — E1165 Type 2 diabetes mellitus with hyperglycemia: Secondary | ICD-10-CM | POA: Diagnosis present

## 2020-06-17 DIAGNOSIS — K08109 Complete loss of teeth, unspecified cause, unspecified class: Secondary | ICD-10-CM | POA: Diagnosis present

## 2020-06-17 DIAGNOSIS — J9811 Atelectasis: Secondary | ICD-10-CM | POA: Diagnosis present

## 2020-06-17 DIAGNOSIS — Z833 Family history of diabetes mellitus: Secondary | ICD-10-CM

## 2020-06-17 DIAGNOSIS — F209 Schizophrenia, unspecified: Secondary | ICD-10-CM | POA: Diagnosis present

## 2020-06-17 DIAGNOSIS — J189 Pneumonia, unspecified organism: Secondary | ICD-10-CM | POA: Diagnosis present

## 2020-06-17 DIAGNOSIS — B957 Other staphylococcus as the cause of diseases classified elsewhere: Secondary | ICD-10-CM | POA: Diagnosis present

## 2020-06-17 DIAGNOSIS — E860 Dehydration: Secondary | ICD-10-CM | POA: Diagnosis present

## 2020-06-17 DIAGNOSIS — F329 Major depressive disorder, single episode, unspecified: Secondary | ICD-10-CM | POA: Diagnosis present

## 2020-06-17 DIAGNOSIS — Z9049 Acquired absence of other specified parts of digestive tract: Secondary | ICD-10-CM

## 2020-06-17 DIAGNOSIS — I10 Essential (primary) hypertension: Secondary | ICD-10-CM | POA: Diagnosis present

## 2020-06-17 DIAGNOSIS — Z794 Long term (current) use of insulin: Secondary | ICD-10-CM

## 2020-06-17 DIAGNOSIS — K219 Gastro-esophageal reflux disease without esophagitis: Secondary | ICD-10-CM | POA: Diagnosis present

## 2020-06-17 DIAGNOSIS — R7881 Bacteremia: Secondary | ICD-10-CM | POA: Diagnosis present

## 2020-06-17 DIAGNOSIS — E871 Hypo-osmolality and hyponatremia: Secondary | ICD-10-CM | POA: Diagnosis present

## 2020-06-17 DIAGNOSIS — E119 Type 2 diabetes mellitus without complications: Secondary | ICD-10-CM | POA: Diagnosis not present

## 2020-06-17 DIAGNOSIS — Z7984 Long term (current) use of oral hypoglycemic drugs: Secondary | ICD-10-CM | POA: Diagnosis not present

## 2020-06-17 DIAGNOSIS — Z20822 Contact with and (suspected) exposure to covid-19: Secondary | ICD-10-CM | POA: Diagnosis present

## 2020-06-17 DIAGNOSIS — I252 Old myocardial infarction: Secondary | ICD-10-CM

## 2020-06-17 DIAGNOSIS — K029 Dental caries, unspecified: Secondary | ICD-10-CM | POA: Diagnosis present

## 2020-06-17 DIAGNOSIS — Z79899 Other long term (current) drug therapy: Secondary | ICD-10-CM

## 2020-06-17 DIAGNOSIS — E785 Hyperlipidemia, unspecified: Secondary | ICD-10-CM

## 2020-06-17 DIAGNOSIS — R112 Nausea with vomiting, unspecified: Secondary | ICD-10-CM | POA: Diagnosis present

## 2020-06-17 DIAGNOSIS — Z8249 Family history of ischemic heart disease and other diseases of the circulatory system: Secondary | ICD-10-CM

## 2020-06-17 DIAGNOSIS — F419 Anxiety disorder, unspecified: Secondary | ICD-10-CM | POA: Diagnosis present

## 2020-06-17 LAB — CBC WITH DIFFERENTIAL/PLATELET
Abs Immature Granulocytes: 0.03 10*3/uL (ref 0.00–0.07)
Basophils Absolute: 0 10*3/uL (ref 0.0–0.1)
Basophils Relative: 0 %
Eosinophils Absolute: 0 10*3/uL (ref 0.0–0.5)
Eosinophils Relative: 0 %
HCT: 42.3 % (ref 39.0–52.0)
Hemoglobin: 14.5 g/dL (ref 13.0–17.0)
Immature Granulocytes: 0 %
Lymphocytes Relative: 22 %
Lymphs Abs: 2.1 10*3/uL (ref 0.7–4.0)
MCH: 28.6 pg (ref 26.0–34.0)
MCHC: 34.3 g/dL (ref 30.0–36.0)
MCV: 83.4 fL (ref 80.0–100.0)
Monocytes Absolute: 1.2 10*3/uL — ABNORMAL HIGH (ref 0.1–1.0)
Monocytes Relative: 13 %
Neutro Abs: 6.1 10*3/uL (ref 1.7–7.7)
Neutrophils Relative %: 65 %
Platelets: 219 10*3/uL (ref 150–400)
RBC: 5.07 MIL/uL (ref 4.22–5.81)
RDW: 15.9 % — ABNORMAL HIGH (ref 11.5–15.5)
WBC: 9.4 10*3/uL (ref 4.0–10.5)
nRBC: 0 % (ref 0.0–0.2)

## 2020-06-17 LAB — PROTIME-INR
INR: 0.9 (ref 0.8–1.2)
Prothrombin Time: 11.6 seconds (ref 11.4–15.2)

## 2020-06-17 LAB — URINALYSIS, COMPLETE (UACMP) WITH MICROSCOPIC
Bacteria, UA: NONE SEEN
Bilirubin Urine: NEGATIVE
Glucose, UA: >=500 mg/dL — AB
Hgb urine dipstick: NEGATIVE
Ketones, ur: NEGATIVE mg/dL
Leukocytes,Ua: NEGATIVE
Nitrite: NEGATIVE
Protein, ur: NEGATIVE mg/dL
Specific Gravity, Urine: 1.027 (ref 1.005–1.030)
Squamous Epithelial / HPF: NONE SEEN (ref 0–5)
pH: 5 (ref 5.0–8.0)

## 2020-06-17 LAB — COMPREHENSIVE METABOLIC PANEL
ALT: 13 U/L (ref 0–44)
AST: 17 U/L (ref 15–41)
Albumin: 3.7 g/dL (ref 3.5–5.0)
Alkaline Phosphatase: 81 U/L (ref 38–126)
Anion gap: 11 (ref 5–15)
BUN: 38 mg/dL — ABNORMAL HIGH (ref 8–23)
CO2: 23 mmol/L (ref 22–32)
Calcium: 8.9 mg/dL (ref 8.9–10.3)
Chloride: 95 mmol/L — ABNORMAL LOW (ref 98–111)
Creatinine, Ser: 1.24 mg/dL (ref 0.61–1.24)
GFR calc non Af Amer: >60 mL/min (ref >60)
Glucose, Bld: 230 mg/dL — ABNORMAL HIGH (ref 70–99)
Potassium: 3.9 mmol/L (ref 3.5–5.1)
Sodium: 129 mmol/L — ABNORMAL LOW (ref 135–145)
Total Bilirubin: 0.5 mg/dL (ref 0.3–1.2)
Total Protein: 6.9 g/dL (ref 6.5–8.1)

## 2020-06-17 LAB — LACTIC ACID, PLASMA
Lactic Acid, Venous: 1.9 mmol/L (ref 0.5–1.9)
Lactic Acid, Venous: 2.3 mmol/L (ref 0.5–1.9)
Lactic Acid, Venous: 1.2 mmol/L (ref 0.5–1.9)

## 2020-06-17 LAB — HEMOGLOBIN A1C
Hgb A1c MFr Bld: 8 % — ABNORMAL HIGH (ref 4.8–5.6)
Mean Plasma Glucose: 182.9 mg/dL

## 2020-06-17 LAB — RESPIRATORY PANEL BY RT PCR (FLU A&B, COVID)
Influenza A by PCR: NEGATIVE
Influenza B by PCR: NEGATIVE
SARS Coronavirus 2 by RT PCR: NEGATIVE

## 2020-06-17 LAB — GLUCOSE, CAPILLARY: Glucose-Capillary: 236 mg/dL — ABNORMAL HIGH (ref 70–99)

## 2020-06-17 MED ORDER — SODIUM CHLORIDE 0.9 % IV BOLUS
1000.0000 mL | Freq: Once | INTRAVENOUS | Status: AC
  Administered 2020-06-17: 1000 mL via INTRAVENOUS

## 2020-06-17 MED ORDER — INSULIN ASPART 100 UNIT/ML ~~LOC~~ SOLN
0.0000 [IU] | Freq: Three times a day (TID) | SUBCUTANEOUS | Status: DC
  Administered 2020-06-18: 2 [IU] via SUBCUTANEOUS
  Administered 2020-06-18 – 2020-06-19 (×3): 1 [IU] via SUBCUTANEOUS
  Administered 2020-06-19: 3 [IU] via SUBCUTANEOUS
  Administered 2020-06-19: 2 [IU] via SUBCUTANEOUS
  Administered 2020-06-20 (×2): 9 [IU] via SUBCUTANEOUS
  Administered 2020-06-21: 7 [IU] via SUBCUTANEOUS
  Administered 2020-06-21: 2 [IU] via SUBCUTANEOUS
  Filled 2020-06-17 (×10): qty 1

## 2020-06-17 MED ORDER — INSULIN ASPART 100 UNIT/ML ~~LOC~~ SOLN
0.0000 [IU] | Freq: Every day | SUBCUTANEOUS | Status: DC
  Administered 2020-06-17 – 2020-06-18 (×2): 2 [IU] via SUBCUTANEOUS
  Filled 2020-06-17 (×4): qty 1

## 2020-06-17 MED ORDER — VANCOMYCIN HCL IN DEXTROSE 1-5 GM/200ML-% IV SOLN
1000.0000 mg | Freq: Two times a day (BID) | INTRAVENOUS | Status: DC
  Administered 2020-06-18: 1000 mg via INTRAVENOUS
  Filled 2020-06-17 (×3): qty 200

## 2020-06-17 MED ORDER — ENOXAPARIN SODIUM 40 MG/0.4ML ~~LOC~~ SOLN
40.0000 mg | SUBCUTANEOUS | Status: DC
  Administered 2020-06-17 – 2020-06-20 (×4): 40 mg via SUBCUTANEOUS
  Filled 2020-06-17 (×4): qty 0.4

## 2020-06-17 MED ORDER — VANCOMYCIN HCL 1750 MG/350ML IV SOLN
1750.0000 mg | Freq: Once | INTRAVENOUS | Status: AC
  Administered 2020-06-17: 1750 mg via INTRAVENOUS
  Filled 2020-06-17: qty 350

## 2020-06-17 NOTE — H&P (Signed)
History and Physical    RANDI COLLEGE TDD:[id number] DOB: 1956-06-09 DOA: 06/17/2020  PCP: Erle Crocker (Inactive)  Patient coming from: Group home  I have personally briefly reviewed patient's old medical records in [facility]  Chief Complaint: Bacteremia  HPI: Johnny [name] is a 64 y.o. male with medical history significant for schizophrenia, hypertension, hyperlipidemia, diabetes who presents due to bacteremia.  Patient is alert and oriented only to self.  Unable to provide much history given his schizophrenia.  Legal guardian was not at bedside and unable to be reached. He was recently evaluated in the ED on 10/5 with sharp substernal chest pain and occasional cough.  He was tachycardic with leukocytosis at the time and was treated presumptively for pneumonia with IV Rocephin and azithromycin and discharged with 5 days of azithromycin.  CTA chest obtained did not show any PE or signs of infection. However 2 of 2 blood cultures later became positive for staph capitis. Patient denies any pain or discomfort.  States he has had good appetite.  He does have poor dentition which could be source of his infection.  He was otherwise afebrile and normotensive.  Leukocytosis from 2 days ago has actually resolved but he did have initial lactate acid of 2.3.  Has mild corrected hyponatremia of 131.  Blood glucose of 230.  AKI from several days ago have also resolved.  He was given 2 L of IV NS fluid bolus and started on IV vancomycin in the ED by Dr. Erma Heritage and hospitalist was called for admission.  Review of Systems: Unable to fully obtain given patient's schizophrenia  Past Medical History:  Diagnosis Date  . Anemia   . Clostridium difficile colitis   . Diabetes mellitus without complication (HCC)   . DM (diabetes mellitus) (HCC)   . GERD (gastroesophageal reflux disease)   . Herpes   . Hyperlipidemia   . Hypertension   . Schizophrenia [facility])     Past Surgical History:    Procedure Laterality Date  . CHOLECYSTECTOMY       reports that he has been smoking cigarettes. He has been smoking about 1.00 pack per day. He has never used smokeless tobacco. He reports that he does not drink alcohol and does not use drugs. Social History  Allergies  Allergen Reactions  . Fish Allergy Rash  . Shellfish Allergy Rash    Family History  Problem Relation Age of Onset  . Diabetes Mother   . Kidney failure Mother   . Diabetes Father   . CAD Brother      Prior to Admission medications   Medication Sig Start Date End Date Taking? Authorizing Provider  albuterol (PROVENTIL HFA;VENTOLIN HFA) 108 (90 BASE) MCG/ACT inhaler Inhale 2 puffs into the lungs every 4 (four) hours as needed for wheezing or shortness of breath.    [provider]  ammonium lactate (AMLACTIN) 12 % cream Apply 1 g topically daily.     [provider]  aspirin 81 MG chewable tablet Chew 81 mg by mouth daily.     [provider]  azithromycin (ZITHROMAX) 250 MG tablet Take 1 tablet PO daily for 4 more days starting the day after your visit to the Emergency Department 06/15/20   Loleta Rose, MD  benazepril-hydrochlorthiazide (LOTENSIN HCT) 20-12.5 MG per tablet Take 1 tablet by mouth daily.    [provider]  benztropine (COGENTIN) 1 MG tablet Take 1 mg by mouth 2 (two) times daily.    [provider]  Canagliflozin-Metformin HCl (INVOKAMET) 50-1000 MG TABS Take 1 tablet by mouth 2 (two) times daily with a meal.    [provider]  clonazePAM (KLONOPIN) 1 MG tablet Take 1 mg by mouth 3 (three) times daily.    [provider]  colesevelam (WELCHOL) 625 MG tablet Take 1,875 mg by mouth 2 (two) times daily.     [provider]  divalproex (DEPAKOTE ER) 500 MG 24 hr tablet Take 500 mg by mouth 2 (two) times daily.     [provider]  fluPHENAZine (PROLIXIN) 5 MG tablet Take 5 mg by mouth 2 (two) times daily. Pt also takes  daily as needed for psychosis/agitation.    [provider]  folic acid (FOLVITE) 1 MG tablet Take 1 mg by mouth daily.    [provider]  insulin glargine (LANTUS) 100 UNIT/ML injection Inject 30 Units into the skin at bedtime.    [provider]  lubiprostone (AMITIZA) 24 MCG capsule Take 24 mcg by mouth 2 (two) times daily.     [provider]  Memantine HCl-Donepezil HCl (NAMZARIC) 28-10 MG CP24 Take 1 capsule by mouth at bedtime.     [provider]  omeprazole (PRILOSEC) 20 MG capsule Take 20 mg by mouth daily.    [provider]  ondansetron (ZOFRAN ODT) 8 MG disintegrating tablet Take 1 tablet (8 mg total) by mouth every 8 (eight) hours as needed for nausea or vomiting. 06/29/15   Sharman Cheek, MD  paliperidone (INVEGA SUSTENNA) 156 MG/ML SUSP injection Inject 156 mg into the muscle every 30 (thirty) days.    [provider]  pioglitazone (ACTOS) 45 MG tablet Take 45 mg by mouth daily.    [provider]  polyethylene glycol (MIRALAX / GLYCOLAX) packet Take 17 g by mouth daily.    [provider]  pravastatin (PRAVACHOL) 40 MG tablet Take 40 mg by mouth at bedtime.     [provider]  QUEtiapine (SEROQUEL) 300 MG tablet Take 300 mg by mouth daily.    [provider]  QUEtiapine (SEROQUEL) 400 MG tablet Take 600 mg by mouth at bedtime.    [provider]  ranitidine (ZANTAC) 150 MG capsule Take 1 capsule (150 mg total) by mouth 2 (two) times daily. 06/29/15   Sharman Cheek, MD  sitaGLIPtin (JANUVIA) 100 MG tablet Take 100 mg by mouth daily.    [provider]  tiotropium (SPIRIVA) 18 MCG inhalation capsule Place 18 mcg into inhaler and inhale daily.    [provider]  traZODone (DESYREL) 100 MG tablet Take 200 mg by mouth at bedtime.    [provider]  Vitamin D, Ergocalciferol, (DRISDOL) 50000 UNITS CAPS capsule Take 50,000 Units by mouth every 7  (seven) days.    [provider]    Physical Exam: Vitals:   06/17/20 1410 06/17/20 1620  BP: (!) 102/54 (!) 101/54  Pulse: 97 96  Resp: 18 18  Temp: 98.8 F (37.1 C) 98.4 F (36.9 C)  TempSrc: Oral Oral  SpO2: 97% 98%  Weight: 72.6 kg   Height: 6' (1.829 m)     Constitutional: NAD, calm, comfortable, nontoxic appearing male sitting upright at 40 degree incline in bed Vitals:   06/17/20 1410 06/17/20 1620  BP: (!) 102/54 (!) 101/54  Pulse: 97 96  Resp: 18 18  Temp: 98.8 F (37.1 C) 98.4 F (36.9 C)  TempSrc: Oral Oral  SpO2: 97% 98%  Weight: 72.6 kg  Height: 6' (1.829 m)    Eyes: PERRL, lids and conjunctivae normal ENMT: Mucous membranes are moist.  Poor dentition with numerous missing teeth and cavities of all remaining teeth Neck: normal, supple Respiratory: clear to auscultation bilaterally, no wheezing, no crackles. Normal respiratory effort. No accessory muscle use.  Cardiovascular: Regular rate and rhythm, no murmurs / rubs / gallops. No extremity edema.   Abdomen: no tenderness, no masses palpated.Bowel sounds positive.  Musculoskeletal: no clubbing / cyanosis. No joint deformity upper and lower extremities. Good ROM, no contractures. Normal muscle tone.  Skin: no rashes, lesions, ulcers. No induration.  No Osler's nodes or Janeway lesions on extremity. Neurologic: CN 2-12 grossly intact. Sensation intact,Strength 5/5 in all 4.  Psychiatric: Alert and oriented only to self. Normal mood.     Labs on Admission: I have personally reviewed following labs and imaging studies  CBC: Recent Labs  Lab 06/14/20 1650 06/17/20 1412  WBC 15.5* 9.4  NEUTROABS  --  6.1  HGB 15.0 14.5  HCT 42.4 42.3  MCV 81.7 83.4  PLT 267 219   Basic Metabolic Panel: Recent Labs  Lab 06/14/20 1650 06/17/20 1412  NA 131* 129*  K 4.8 3.9  CL 93* 95*  CO2 25 23  GLUCOSE 193* 230*  BUN 53* 38*  CREATININE 1.35* 1.24  CALCIUM 9.4 8.9   GFR: Estimated Creatinine  Clearance: 64.2 mL/min (by C-G formula based on SCr of 1.24 mg/dL). Liver Function Tests: Recent Labs  Lab 06/17/20 1412  AST 17  ALT 13  ALKPHOS 81  BILITOT 0.5  PROT 6.9  ALBUMIN 3.7   No results for input(s): LIPASE, AMYLASE in the last 168 hours. No results for input(s): AMMONIA in the last 168 hours. Coagulation Profile: Recent Labs  Lab 06/17/20 1412  INR 0.9   Cardiac Enzymes: No results for input(s): CKTOTAL, CKMB, CKMBINDEX, TROPONINI in the last 168 hours. BNP (last 3 results) No results for input(s): PROBNP in the last 8760 hours. HbA1C: No results for input(s): HGBA1C in the last 72 hours. CBG: No results for input(s): GLUCAP in the last 168 hours. Lipid Profile: No results for input(s): CHOL, HDL, LDLCALC, TRIG, CHOLHDL, LDLDIRECT in the last 72 hours. Thyroid Function Tests: No results for input(s): TSH, T4TOTAL, FREET4, T3FREE, THYROIDAB in the last 72 hours. Anemia Panel: No results for input(s): VITAMINB12, FOLATE, FERRITIN, TIBC, IRON, RETICCTPCT in the last 72 hours. Urine analysis:    Component Value Date/Time   COLORURINE YELLOW (A) 06/17/2020 1412   APPEARANCEUR CLEAR (A) 06/17/2020 1412   LABSPEC 1.027 06/17/2020 1412   PHURINE 5.0 06/17/2020 1412   GLUCOSEU >=500 (A) 06/17/2020 1412   HGBUR NEGATIVE 06/17/2020 1412   HGBUR negative 06/10/2008 0920   BILIRUBINUR NEGATIVE 06/17/2020 1412   KETONESUR NEGATIVE 06/17/2020 1412   PROTEINUR NEGATIVE 06/17/2020 1412   UROBILINOGEN 1.0 08/04/2008 1322   NITRITE NEGATIVE 06/17/2020 1412   LEUKOCYTESUR NEGATIVE 06/17/2020 1412    Radiological Exams on Admission: No results found.    Assessment/Plan  Staph capitis bacteremia unclear source but could be due to poor denition No prosthetic device per chart review. No findings on exam suggestive of endocarditis Continue IV Vancomycin Repeat blood cultures pending Will need ID consult for length of treatment  Recent tx of pneumonia low  suspicion. Will d/c Azithromycin   Schizophrenia Need to resume antipsychotics once able to verify medications with the group home  Diabetes place on sensitive SSI   HTN Controlled.  Will need to verify if  patient is on any antihypertensives with group home in the morning   DVT prophylaxis:.Lovenox Code Status: Full Family Communication: Plan discussed with patient at bedside. Unable to reach legal DSS guardian disposition Plan: Home with at least 2 midnight stays  Consults called:  Admission status: inpatient  Status is: Inpatient  Remains inpatient appropriate because:Inpatient level of care appropriate due to severity of illness   Dispo: The patient is from: Group home              Anticipated d/c is to: Group home              Anticipated d/c date is: 2 days              Patient currently is not medically stable to d/c.         Anselm Jungling DO [facility]   If 7PM-7AM, please contact night-coverage [email or web address]   06/17/2020, 7:27 PM

## 2020-06-17 NOTE — Progress Notes (Signed)
Pharmacy Antibiotic Note  Johnny Johnny [name] is a 64 y.o. male admitted Johnny [name] 06/17/2020. Patient recently seen in ED 10/5 for chest pain, later discharged same day. Blood cultures drawn at that visit with 2 bottles (2/2 sets) staph capitis (previously reported 1/4 bottles). Patient has returned to the hospital per MD recommendation. Pharmacy has been consulted for vancomycin dosing.  Plan: Vancomycin 1750 mg IV x 1 given. Will start vanc 1000 mg IV q12h to start in the morning. Follow renal function for any necessary adjustments and antibiotic plan of care for appropriate de-escalation.  Height: 6' (182.9 cm) Weight: 72.6 kg (160 lb) IBW/kg (Calculated) : 77.6  Temp (24hrs), Avg:98.6 F (37 C), Min:98.4 F (36.9 C), Max:98.8 F (37.1 C)  Recent Labs  Lab 06/14/20 1650 06/15/20 0403 06/17/20 1412 06/17/20 1612  WBC 15.5*  --  9.4  --   CREATININE 1.35*  --  1.24  --   LATICACIDVEN  --  1.9 1.9 2.3*    Estimated Creatinine Clearance: 64.2 mL/min (by C-G formula based Johnny [name] SCr of 1.24 mg/dL).    Allergies  Allergen Reactions  . Fish Allergy Rash  . Shellfish Allergy Rash    Antimicrobials this admission: Vancomycin 10/7 >>   Microbiology results: 10/5 BCx: 2/4 bottles (2/2 sets) staph capitis 10/7 BCx: pending   Thank you for allowing pharmacy to be a part of this patient's care.  Pricilla Riffle, PharmD 06/17/2020 7:25 PM

## 2020-06-17 NOTE — ED Provider Notes (Signed)
[facility] Emergency Department Provider Note  ____________________________________________   First MD Initiated Contact with Patient 06/17/20 1719     (approximate)  I have reviewed the triage vital signs and the nursing notes.   HISTORY  Chief Complaint Abnormal Lab    HPI Johnny [name] is a 64 y.o. male with history of hypertension, hyperlipidemia, diabetes, here with bacteremia.  The patient was just here on 10/5 and was here with complaint of epigastric and right-sided chest pain.  He was diagnosed with possible pneumonia with azithromycin.  He was tachycardic at that time.  He returns today due to positive blood cultures with staph capitis.  History is somewhat limited due to the patient schizophrenia, though he states that he has felt "fine."  He does endorse some sore teeth and recently lost a tooth due to poor dentition.  Per the group home supervisor, the patient has had decreased energy and less appetite than usual.  He has been coughing.  They do not recall any fevers.  Level 5 caveat invoked as remainder of history, ROS, and physical exam limited due to patient's schizophrenia.        Past Medical History:  Diagnosis Date  . Anemia   . Clostridium difficile colitis   . Diabetes mellitus without complication (HCC)   . DM (diabetes mellitus) (HCC)   . GERD (gastroesophageal reflux disease)   . Herpes   . Hyperlipidemia   . Hypertension   . Schizophrenia [facility])     Patient Active Problem List   Diagnosis Date Noted  . Acute renal failure (ARF) (HCC) 07/05/2015  . SINUS TACHYCARDIA 06/10/2008  . HYPOTENSION, ORTHOSTATIC 06/10/2008  . DIABETES MELLITUS, CONTROLLED, WITHOUT COMPLICATIONS 05/19/2008  . HYPOGLYCEMIA 04/27/2008  . HYPERLIPIDEMIA 01/22/2008  . SCHIZOPHRENIA 01/22/2008  . ANXIETY 01/22/2008  . DEPRESSION 01/22/2008  . HYPERTENSION 01/22/2008  . MYOCARDIAL INFARCTION, HX OF 01/22/2008  . BRONCHITIS, CHRONIC 01/22/2008    . GERD 01/22/2008    Past Surgical History:  Procedure Laterality Date  . CHOLECYSTECTOMY      Prior to Admission medications   Medication Sig Start Date End Date Taking? Authorizing Provider  albuterol (PROVENTIL HFA;VENTOLIN HFA) 108 (90 BASE) MCG/ACT inhaler Inhale 2 puffs into the lungs every 4 (four) hours as needed for wheezing or shortness of breath.    [provider]  ammonium lactate (AMLACTIN) 12 % cream Apply 1 g topically daily.     [provider]  aspirin 81 MG chewable tablet Chew 81 mg by mouth daily.     [provider]  azithromycin (ZITHROMAX) 250 MG tablet Take 1 tablet PO daily for 4 more days starting the day after your visit to the Emergency Department 06/15/20   Loleta Rose, MD  benazepril-hydrochlorthiazide (LOTENSIN HCT) 20-12.5 MG per tablet Take 1 tablet by mouth daily.    [provider]  benztropine (COGENTIN) 1 MG tablet Take 1 mg by mouth 2 (two) times daily.    [provider]  Canagliflozin-Metformin HCl (INVOKAMET) 50-1000 MG TABS Take 1 tablet by mouth 2 (two) times daily with a meal.    [provider]  clonazePAM (KLONOPIN) 1 MG tablet Take 1 mg by mouth 3 (three) times daily.    [provider]  colesevelam (WELCHOL) 625 MG tablet Take 1,875 mg by mouth 2 (two) times daily.     [provider]  divalproex (DEPAKOTE ER) 500 MG 24 hr tablet Take 500 mg by mouth 2 (two) times daily.  [provider]  fluPHENAZine (PROLIXIN) 5 MG tablet Take 5 mg by mouth 2 (two) times daily. Pt also takes daily as needed for psychosis/agitation.    [provider]  folic acid (FOLVITE) 1 MG tablet Take 1 mg by mouth daily.    [provider]  insulin glargine (LANTUS) 100 UNIT/ML injection Inject 30 Units into the skin at bedtime.    [provider]  lubiprostone (AMITIZA) 24 MCG capsule Take 24 mcg by mouth 2 (two) times daily.     [provider]   Memantine HCl-Donepezil HCl (NAMZARIC) 28-10 MG CP24 Take 1 capsule by mouth at bedtime.     [provider]  omeprazole (PRILOSEC) 20 MG capsule Take 20 mg by mouth daily.    [provider]  ondansetron (ZOFRAN ODT) 8 MG disintegrating tablet Take 1 tablet (8 mg total) by mouth every 8 (eight) hours as needed for nausea or vomiting. 06/29/15   Sharman Cheek, MD  paliperidone (INVEGA SUSTENNA) 156 MG/ML SUSP injection Inject 156 mg into the muscle every 30 (thirty) days.    [provider]  pioglitazone (ACTOS) 45 MG tablet Take 45 mg by mouth daily.    [provider]  polyethylene glycol (MIRALAX / GLYCOLAX) packet Take 17 g by mouth daily.    [provider]  pravastatin (PRAVACHOL) 40 MG tablet Take 40 mg by mouth at bedtime.     [provider]  QUEtiapine (SEROQUEL) 300 MG tablet Take 300 mg by mouth daily.    [provider]  QUEtiapine (SEROQUEL) 400 MG tablet Take 600 mg by mouth at bedtime.    [provider]  ranitidine (ZANTAC) 150 MG capsule Take 1 capsule (150 mg total) by mouth 2 (two) times daily. 06/29/15   Sharman Cheek, MD  sitaGLIPtin (JANUVIA) 100 MG tablet Take 100 mg by mouth daily.    [provider]  tiotropium (SPIRIVA) 18 MCG inhalation capsule Place 18 mcg into inhaler and inhale daily.    [provider]  traZODone (DESYREL) 100 MG tablet Take 200 mg by mouth at bedtime.    [provider]  Vitamin D, Ergocalciferol, (DRISDOL) 50000 UNITS CAPS capsule Take 50,000 Units by mouth every 7 (seven) days.    [provider]    Allergies Fish allergy and Shellfish allergy  Family History  Problem Relation Age of Onset  . Diabetes Mother   . Kidney failure Mother   . Diabetes Father   . CAD Brother     Social History Social History   Tobacco Use  . Smoking status: Current Every Day Smoker    Packs/day: 1.00    Types: Cigarettes  . Smokeless  tobacco: Never Used  Substance Use Topics  . Alcohol use: No  . Drug use: No    Review of Systems  Review of Systems  Unable to perform ROS: Psychiatric disorder     ____________________________________________  PHYSICAL EXAM:      VITAL SIGNS: ED Triage Vitals [06/17/20 1410]  Enc Vitals Group     BP (!) 102/54     Pulse Rate 97     Resp 18     Temp 98.8 F (37.1 C)     Temp Source Oral     SpO2 97 %     Weight 160 lb (72.6 kg)     Height 6' (1.829 m)     Head Circumference      Peak Flow      Pain  Score 0     Pain Loc      Pain Edu?      Excl. in GC?      Physical Exam Vitals and nursing note reviewed.  Constitutional:      General: He is not in acute distress.    Appearance: He is well-developed.  HENT:     Head: Normocephalic and atraumatic.     Comments: Markedly poor dentition  Eyes:     Conjunctiva/sclera: Conjunctivae normal.  Cardiovascular:     Rate and Rhythm: Normal rate and regular rhythm.     Heart sounds: Normal heart sounds.  Pulmonary:     Effort: Pulmonary effort is normal. No respiratory distress.     Breath sounds: No wheezing.  Abdominal:     General: There is no distension.  Musculoskeletal:     Cervical back: Neck supple.  Skin:    General: Skin is warm.     Capillary Refill: Capillary refill takes less than 2 seconds.     Findings: No rash.  Neurological:     Mental Status: He is alert and oriented to person, place, and time.     Motor: No abnormal muscle tone.       ____________________________________________   LABS (all labs ordered are listed, but only abnormal results are displayed)  Labs Reviewed  COMPREHENSIVE METABOLIC PANEL - Abnormal; Notable for the following components:      Result Value   Sodium 129 (*)    Chloride 95 (*)    Glucose, Bld 230 (*)    BUN 38 (*)    All other components within normal limits  LACTIC ACID, PLASMA - Abnormal; Notable for the following components:   Lactic Acid, Venous 2.3  (*)    All other components within normal limits  CBC WITH DIFFERENTIAL/PLATELET - Abnormal; Notable for the following components:   RDW 15.9 (*)    Monocytes Absolute 1.2 (*)    All other components within normal limits  URINALYSIS, COMPLETE (UACMP) WITH MICROSCOPIC - Abnormal; Notable for the following components:   Color, Urine YELLOW (*)    APPearance CLEAR (*)    Glucose, UA >=500 (*)    All other components within normal limits  CULTURE, BLOOD (ROUTINE X 2)  CULTURE, BLOOD (ROUTINE X 2)  LACTIC ACID, PLASMA  PROTIME-INR  LACTIC ACID, PLASMA    ____________________________________________  EKG:  ________________________________________  RADIOLOGY All imaging, including plain films, CT scans, and ultrasounds, independently reviewed by me, and interpretations confirmed via formal radiology reads.  ED MD interpretation:     Official radiology report(s): No results found.  ____________________________________________  PROCEDURES   Procedure(s) performed (including Critical Care):  Procedures  ____________________________________________  INITIAL IMPRESSION / MDM / ASSESSMENT AND PLAN / ED COURSE  As part of my medical decision making, I reviewed the following data within the electronic MEDICAL RECORD NUMBER Nursing notes reviewed and incorporated, Old chart reviewed, Notes from prior ED visits, and [place] Controlled Substance Database       *Johnny [name] was evaluated in Emergency Department on 06/17/2020 for the symptoms described in the history of present illness. He was evaluated in the context of the global COVID-19 pandemic, which necessitated consideration that the patient might be at risk for infection with the SARS-CoV-2 virus that causes COVID-19. Institutional protocols and algorithms that pertain to the evaluation of patients at risk for COVID-19 are in a state of rapid change based on information released by regulatory bodies including the CDC and  federal and  state organizations. These policies and algorithms were followed during the patient's care in the ED.  Some ED evaluations and interventions may be delayed as a result of limited staffing during the pandemic.*     Medical Decision Making: 64 year old male here with staph bacteremia.  Reviewed previous cultures, which grew 2 out of 2 staph.  On arrival here, his white count seems to be improving but lactic acid elevated at 2.3.  He has some worsening hyponatremia which I suspect is hypovolemic due to poor p.o. intake and his BUN is elevated.  Will start fluids, vancomycin, and plan to admit.  Of note, his CT did not show pneumonia but he did have cough during his previous visit.  He does have markedly poor dentition and reportedly recently lost a tooth.  This could be a possible source.  May benefit from ID consultation.  ____________________________________________  FINAL CLINICAL IMPRESSION(S) / ED DIAGNOSES  Final diagnoses:  Coag negative Staphylococcus bacteremia     MEDICATIONS GIVEN DURING THIS VISIT:  Medications  vancomycin (VANCOREADY) IVPB 1750 mg/350 mL (1,750 mg Intravenous New Bag/Given 06/17/20 1751)  sodium chloride 0.9 % bolus 1,000 mL (1,000 mLs Intravenous New Bag/Given 06/17/20 1745)  sodium chloride 0.9 % bolus 1,000 mL (1,000 mLs Intravenous New Bag/Given 06/17/20 1745)     ED Discharge Orders    None       Note:  This document was prepared using Dragon voice recognition software and may include unintentional dictation errors.   Shaune Pollack, MD 06/17/20 [place]

## 2020-06-17 NOTE — Progress Notes (Signed)
PHARMACY -  BRIEF ANTIBIOTIC NOTE   Pharmacy has received consult(s) for vancomycin from an ED provider.  The patient's profile has been reviewed for ht/wt/allergies/indication/available labs.    One time order(s) placed for Vancomycin 1750mg    Further antibiotics/pharmacy consults should be ordered by admitting physician if indicated.                       Thank you, , PharmD, BCPS Clinical Pharmacist 06/17/2020 5:26 PM

## 2020-06-17 NOTE — ED Notes (Signed)
Blood cultures drawn by gabby NT

## 2020-06-17 NOTE — ED Triage Notes (Signed)
Pt called back for positive blood culture. Pt has no complaints.  From [facility].  On abx for PNA at this time.  VSS at this time.  Unlabored. With staff from group home at this time.  Spoke with legal guardian, diane moorefield with [place] [employer].

## 2020-06-18 DIAGNOSIS — F209 Schizophrenia, unspecified: Secondary | ICD-10-CM

## 2020-06-18 DIAGNOSIS — E119 Type 2 diabetes mellitus without complications: Secondary | ICD-10-CM | POA: Diagnosis not present

## 2020-06-18 DIAGNOSIS — R7881 Bacteremia: Secondary | ICD-10-CM | POA: Diagnosis not present

## 2020-06-18 LAB — CBC
HCT: 36.2 % — ABNORMAL LOW (ref 39.0–52.0)
Hemoglobin: 12.4 g/dL — ABNORMAL LOW (ref 13.0–17.0)
MCH: 29.2 pg (ref 26.0–34.0)
MCHC: 34.3 g/dL (ref 30.0–36.0)
MCV: 85.2 fL (ref 80.0–100.0)
Platelets: 173 10*3/uL (ref 150–400)
RBC: 4.25 MIL/uL (ref 4.22–5.81)
RDW: 16.3 % — ABNORMAL HIGH (ref 11.5–15.5)
WBC: 7.4 10*3/uL (ref 4.0–10.5)
nRBC: 0 % (ref 0.0–0.2)

## 2020-06-18 LAB — CULTURE, BLOOD (ROUTINE X 2): Special Requests: ADEQUATE

## 2020-06-18 LAB — BASIC METABOLIC PANEL
Anion gap: 9 (ref 5–15)
BUN: 27 mg/dL — ABNORMAL HIGH (ref 8–23)
CO2: 23 mmol/L (ref 22–32)
Calcium: 8.6 mg/dL — ABNORMAL LOW (ref 8.9–10.3)
Chloride: 103 mmol/L (ref 98–111)
Creatinine, Ser: 1.02 mg/dL (ref 0.61–1.24)
GFR calc non Af Amer: >60 mL/min (ref >60)
Glucose, Bld: 220 mg/dL — ABNORMAL HIGH (ref 70–99)
Potassium: 4.1 mmol/L (ref 3.5–5.1)
Sodium: 135 mmol/L (ref 135–145)

## 2020-06-18 LAB — PROCALCITONIN: Procalcitonin: 1.63 ng/mL

## 2020-06-18 LAB — HIV ANTIBODY (ROUTINE TESTING W REFLEX): HIV Screen 4th Generation wRfx: NONREACTIVE

## 2020-06-18 LAB — GLUCOSE, CAPILLARY
Glucose-Capillary: 181 mg/dL — ABNORMAL HIGH (ref 70–99)
Glucose-Capillary: 141 mg/dL — ABNORMAL HIGH (ref 70–99)
Glucose-Capillary: 137 mg/dL — ABNORMAL HIGH (ref 70–99)
Glucose-Capillary: 221 mg/dL — ABNORMAL HIGH (ref 70–99)

## 2020-06-18 MED ORDER — CEFAZOLIN SODIUM-DEXTROSE 2-4 GM/100ML-% IV SOLN
2.0000 g | Freq: Three times a day (TID) | INTRAVENOUS | Status: DC
  Administered 2020-06-18 – 2020-06-19 (×3): 2 g via INTRAVENOUS
  Filled 2020-06-18 (×9): qty 100

## 2020-06-18 MED ORDER — HYDRALAZINE HCL 25 MG PO TABS: 25.0000 mg | ORAL_TABLET | Freq: Three times a day (TID) | ORAL | Status: DC | PRN

## 2020-06-18 NOTE — Consult Note (Signed)
NAME: Johnny [name]  DOB: 04-11-1956  MRN: [id number]  Date/Time: 06/18/2020 10:20 AM  REQUESTING PROVIDER: Dr.Griffith Subjective:  REASON FOR CONSULT: bacteremia ?PT is not a reliable historian because of schizophrenia- he was talking when I entered his room- he said he was talking to jesus Johnny [name] is a 63 y.o. male with a history of diabetes mellitus, hypertension, hyperlipidemia, schizophrenia who lives in a group home presented to the ED on 06/14/2020 with sharp substernal chest pain and right-sided chest pain of 2 days duration.  with nausea vomiting and diarrhea for 24 hours and also having some chest tightness.  Vitals in the ED were temperature of 99.5, BP of 137/77, heart rate of 113, pulse ox of 95%.  Labs revealed a WBC of 15.5, hemoglobin of 15, platelet of 267. Creatinine was 1.35. chest x-ray revealed a right lower lobe infiltrate.  He was discharged back to the group home on azithromycin. He was called back to the hospital on 06/17/2020 because the blood culture that was sent on 06/15/2020 came back positive for staph capitis. This time in the ED his temperature was 98.8, BP of 102/54, heart rate of 96 and pulse ox of 97%.  Blood cultures were repeated.  WBC was 9.4, hemoglobin 14.5, creatinine 1.24, sodium 129,Potassium 3.9, and platelet was 219.  He has been started on vancomycin. I am asked to see the patient for the same.  Patient has seen pulmonologist in May 2021 for a right-sided loculated pleural effusion and and it was thought to be due to related to aspiration.  He was given 10 days of Augmentin.  Pt says he is fine Denies any fever, headache, cough, sob, chest pain, abdominal pain or diarrhea    Past Medical History:  Diagnosis Date  . Anemia   . Clostridium difficile colitis   . Diabetes mellitus without complication (HCC)   . DM (diabetes mellitus) (HCC)   . GERD (gastroesophageal reflux disease)   . Herpes   . Hyperlipidemia   . Hypertension   .  Schizophrenia [facility])     Past Surgical History:  Procedure Laterality Date  . CHOLECYSTECTOMY      Social History   Socioeconomic History  . Marital status: Married    Spouse name: Not on file  . Number of children: Not on file  . Years of education: Not on file  . Highest education level: Not on file  Occupational History  . Not on file  Tobacco Use  . Smoking status: Current Every Day Smoker    Packs/day: 1.00    Types: Cigarettes  . Smokeless tobacco: Never Used  Substance and Sexual Activity  . Alcohol use: No  . Drug use: No  . Sexual activity: Not on file  Other Topics Concern  . Not on file  Social History Narrative   ** Merged History Encounter **       Social Determinants of Health   Financial Resource Strain:   . Difficulty of Paying Living Expenses: Not on file  Food Insecurity:   . Worried About Programme researcher, broadcasting/film/video in the Last Year: Not on file  . Ran Out of Food in the Last Year: Not on file  Transportation Needs:   . Lack of Transportation (Medical): Not on file  . Lack of Transportation (Non-Medical): Not on file  Physical Activity:   . Days of Exercise per Week: Not on file  . Minutes of Exercise per Session: Not on file  Stress:   .  Feeling of Stress : Not on file  Social Connections:   . Frequency of Communication with Friends and Family: Not on file  . Frequency of Social Gatherings with Friends and Family: Not on file  . Attends Religious Services: Not on file  . Active Member of Clubs or Organizations: Not on file  . Attends Banker Meetings: Not on file  . Marital Status: Not on file  Intimate Partner Violence:   . Fear of Current or Ex-Partner: Not on file  . Emotionally Abused: Not on file  . Physically Abused: Not on file  . Sexually Abused: Not on file    Family History  Problem Relation Age of Onset  . Diabetes Mother   . Kidney failure Mother   . Diabetes Father   . CAD Brother    Allergies  Allergen Reactions    . Fish Allergy Rash  . Shellfish Allergy Rash   ? Current Facility-Administered Medications  Medication Dose Route Frequency Provider Last Rate Last Admin  . enoxaparin (LOVENOX) injection 40 mg  40 mg Subcutaneous Q24H Tu, Ching T, DO   40 mg at 06/17/20 1957  . insulin aspart (novoLOG) injection 0-5 Units  0-5 Units Subcutaneous QHS Tu, Ching T, DO   2 Units at 06/17/20 2220  . insulin aspart (novoLOG) injection 0-9 Units  0-9 Units Subcutaneous TID WC Tu, Ching T, DO   2 Units at 06/18/20 0845  . vancomycin (VANCOCIN) IVPB 1000 mg/200 mL premix  1,000 mg Intravenous Q12H Tu, Ching T, DO 200 mL/hr at 06/18/20 0544 1,000 mg at 06/18/20 0544     Abtx:  Anti-infectives (From admission, onward)   Start     Dose/Rate Route Frequency Ordered Stop   06/18/20 0600  vancomycin (VANCOCIN) IVPB 1000 mg/200 mL premix        1,000 mg 200 mL/hr over 60 Minutes Intravenous Every 12 hours 06/17/20 1925     06/17/20 1730  vancomycin (VANCOREADY) IVPB 1750 mg/350 mL        1,750 mg 175 mL/hr over 120 Minutes Intravenous  Once 06/17/20 1726 06/17/20 2032     Says he is fine and denies all symptoms REVIEW OF SYSTEMS:  Const: negative fever, negative chills, negative weight loss Eyes: negative diplopia or visual changes, negative eye pain ENT: negative coryza, negative sore throat Resp: negative cough, hemoptysis, dyspnea Cards: negative for chest pain, palpitations, lower extremity edema GU: negative for frequency, dysuria and hematuria GI: Negative for abdominal pain, diarrhea, bleeding, constipation Skin: negative for rash and pruritus Heme: negative for easy bruising and gum/nose bleeding MS: negative for myalgias, arthralgias, back pain and muscle weakness Neurolo:negative for headaches, dizziness, vertigo, memory problems  Psych: negative for feelings of anxiety, depression  Endocrine: negative for thyroid, diabetes Allergy/Immunology-as above  Objective:  VITALS:  BP 129/75   Pulse  66   Temp 99.7 F (37.6 C) (Oral)   Resp 16   Ht 6' (1.829 m)   Wt 72.6 kg   SpO2 99%   BMI 21.70 kg/m  PHYSICAL EXAM:  General: Awake, oriented in person, place Was talking tangentially - wanted me to give him a pair of gloves and put alcohol on his legs Head: Normocephalic, without obvious abnormality, atraumatic. Eyes: Conjunctivae clear, anicteric sclerae. Pupils are equal ENT Nares normal. No drainage or sinus tenderness. Poor dentition Neck: Supple, symmetrical, no adenopathy, thyroid: non tender no carotid bruit and no JVD. Back: No CVA tenderness. Lungs: Clear to auscultation bilaterally. No Wheezing or  Rhonchi. No rales. Heart: Regular rate and rhythm, no murmur, rub or gallop. Abdomen: Soft, non-tender,not distended. Bowel sounds normal. No masses Extremities: atraumatic, no cyanosis. No edema. No clubbing Skin: dry skin Lymph: Cervical, supraclavicular normal. Neurologic: Grossly non-focal Pertinent Labs Lab Results CBC    Component Value Date/Time   WBC 7.4 06/18/2020 0459   RBC 4.25 06/18/2020 0459   HGB 12.4 (L) 06/18/2020 0459   HCT 36.2 (L) 06/18/2020 0459   PLT 173 06/18/2020 0459   MCV 85.2 06/18/2020 0459   MCH 29.2 06/18/2020 0459   MCHC 34.3 06/18/2020 0459   RDW 16.3 (H) 06/18/2020 0459   LYMPHSABS 2.1 06/17/2020 1412   MONOABS 1.2 (H) 06/17/2020 1412   EOSABS 0.0 06/17/2020 1412   BASOSABS 0.0 06/17/2020 1412    CMP Latest Ref Rng & Units 06/18/2020 06/17/2020 06/14/2020  Glucose 70 - 99 mg/dL 638(G) 665(L) 935(T)  BUN 8 - 23 mg/dL 01(X) 79(T) 90(Z)  Creatinine 0.61 - 1.24 mg/dL 0.09 2.33 0.07(M)  Sodium 135 - 145 mmol/L 135 129(L) 131(L)  Potassium 3.5 - 5.1 mmol/L 4.1 3.9 4.8  Chloride 98 - 111 mmol/L 103 95(L) 93(L)  CO2 22 - 32 mmol/L 23 23 25   Calcium 8.9 - 10.3 mg/dL ) 8.9 9.4  Total Protein 6.5 - 8.1 g/dL - 6.9 -  Total Bilirubin 0.3 - 1.2 mg/dL - 0.5 -  Alkaline Phos 38 - 126 U/L - 81 -  AST 15 - 41 U/L - 17 -  ALT 0 - 44 U/L  - 13 -      Microbiology: Recent Results (from the past 240 hour(s))  Blood Culture (routine x 2)     Status: Abnormal   Collection Time: 06/15/20  4:03 AM   Specimen: BLOOD  Result Value Ref Range Status   Specimen Description   Final    BLOOD LEFT ANTECUBITAL Performed at [facility], [address] [place], [place]    Special Requests   Final    BOTTLES DRAWN AEROBIC AND ANAEROBIC Blood Culture adequate volume Performed at [facility], [address] [place], [place]    Culture  Setup Time   Final    AEROBIC BOTTLE ONLY GRAM POSITIVE COCCI CRITICAL RESULT CALLED TO, READ BACK BY AND VERIFIED WITH: JASON ROBBINS 06/15/20 AT 2234 BY ACR Performed at [facility], [address] [place], [place]    Culture STAPHYLOCOCCUS CAPITIS (A)  Final   Report Status 06/18/2020 FINAL  Final   Organism ID, Bacteria STAPHYLOCOCCUS CAPITIS  Final      Susceptibility   Staphylococcus capitis - MIC*    CIPROFLOXACIN <=0.5 SENSITIVE Sensitive     ERYTHROMYCIN <=0.25 SENSITIVE Sensitive     GENTAMICIN <=0.5 SENSITIVE Sensitive     OXACILLIN <=0.25 SENSITIVE Sensitive     TETRACYCLINE <=1 SENSITIVE Sensitive     VANCOMYCIN <=0.5 SENSITIVE Sensitive     TRIMETH/SULFA <=10 SENSITIVE Sensitive     CLINDAMYCIN <=0.25 SENSITIVE Sensitive     RIFAMPIN <=0.5 SENSITIVE Sensitive     Inducible Clindamycin NEGATIVE Sensitive     * STAPHYLOCOCCUS CAPITIS  Blood Culture (routine x 2)     Status: Abnormal   Collection Time: 06/15/20  4:03 AM   Specimen: BLOOD  Result Value Ref Range Status   Specimen Description   Final    BLOOD RIGHT ANTECUBITAL Performed at [facility], [address], [place], [place]    Special Requests  Final    BOTTLES DRAWN AEROBIC AND ANAEROBIC Blood Culture results may not be optimal due to an excessive volume of blood received in culture bottles Performed at [facility], [address] [place], [place] [address]    Culture  Setup Time   Final    GRAM POSITIVE COCCI ANAEROBIC BOTTLE ONLY CRITICAL VALUE NOTED.  VALUE IS CONSISTENT WITH PREVIOUSLY REPORTED AND CALLED VALUE.    Culture (A)  Final    STAPHYLOCOCCUS CAPITIS SUSCEPTIBILITIES PERFORMED ON PREVIOUS CULTURE WITHIN THE LAST 5 DAYS. Performed at [facility], [address] [place], [place] [address]    Report Status 06/18/2020 FINAL  Final  Respiratory Panel by RT PCR (Flu A&B, Covid) - Nasopharyngeal Swab     Status: None   Collection Time: 06/15/20  4:03 AM   Specimen: Nasopharyngeal Swab  Result Value Ref Range Status   SARS Coronavirus 2 by RT PCR NEGATIVE NEGATIVE Final    Comment: (NOTE) SARS-CoV-2 target nucleic acids are NOT DETECTED.  The SARS-CoV-2 RNA is generally detectable in upper respiratoy specimens during the acute phase of infection. The lowest concentration of SARS-CoV-2 viral copies this assay can detect is 131 copies/mL. A negative result does not preclude SARS-Cov-2 infection and should not be used as the sole basis for treatment or other patient management decisions. A negative result may occur with  improper specimen collection/handling, submission of specimen other than nasopharyngeal swab, presence of viral mutation(s) within the areas targeted by this assay, and inadequate number of viral copies (<131 copies/mL). A negative result must be combined with clinical observations, patient history, and epidemiological information. The expected result is Negative.  Fact Sheet for Patients:  https://www.moore.com/  Fact Sheet for Healthcare Providers:  https://www.young.biz/  This test is no t yet approved or cleared by the [place] FDA and  has been authorized for detection and/or diagnosis of SARS-CoV-2 by FDA under an Emergency Use Authorization (EUA). This EUA will remain  in effect (meaning this test can be used)  for the duration of the COVID-19 declaration under Section 564(b)(1) of the Act, 21 U.S.C. section 360bbb-3(b)(1), unless the authorization is terminated or revoked sooner.     Influenza A by PCR NEGATIVE NEGATIVE Final   Influenza B by PCR NEGATIVE NEGATIVE Final    Comment: (NOTE) The Xpert Xpress SARS-CoV-2/FLU/RSV assay is intended as an aid in  the diagnosis of influenza from Nasopharyngeal swab specimens and  should not be used as a sole basis for treatment. Nasal washings and  aspirates are unacceptable for Xpert Xpress SARS-CoV-2/FLU/RSV  testing.  Fact Sheet for Patients: https://www.moore.com/  Fact Sheet for Healthcare Providers: https://www.young.biz/  This test is not yet approved or cleared by the [place] FDA and  has been authorized for detection and/or diagnosis of SARS-CoV-2 by  FDA under an Emergency Use Authorization (EUA). This EUA will remain  in effect (meaning this test can be used) for the duration of the  Covid-19 declaration under Section 564(b)(1) of the Act, 21  U.S.C. section 360bbb-3(b)(1), unless the authorization is  terminated or revoked. Performed at [facility], [address] [place], [place] [address]   Blood Culture ID Panel (Reflexed)     Status: Abnormal   Collection Time: 06/15/20  4:03 AM  Result Value Ref Range Status   Enterococcus faecalis NOT DETECTED NOT DETECTED Final   Enterococcus Faecium NOT DETECTED NOT DETECTED Final   Listeria monocytogenes NOT DETECTED NOT DETECTED Final   Staphylococcus  species DETECTED (A) NOT DETECTED Final    Comment: CRITICAL RESULT CALLED TO, READ BACK BY AND VERIFIED WITH: JASON ROBBINS 06/15/20 AT 2234 BY ACR    Staphylococcus aureus (BCID) NOT DETECTED NOT DETECTED Final   Staphylococcus epidermidis NOT DETECTED NOT DETECTED Final   Staphylococcus lugdunensis NOT DETECTED NOT DETECTED Final   Streptococcus species NOT DETECTED NOT DETECTED  Final   Streptococcus agalactiae NOT DETECTED NOT DETECTED Final   Streptococcus pneumoniae NOT DETECTED NOT DETECTED Final   Streptococcus pyogenes NOT DETECTED NOT DETECTED Final   A.calcoaceticus-baumannii NOT DETECTED NOT DETECTED Final   Bacteroides fragilis NOT DETECTED NOT DETECTED Final   Enterobacterales NOT DETECTED NOT DETECTED Final   Enterobacter cloacae complex NOT DETECTED NOT DETECTED Final   Escherichia coli NOT DETECTED NOT DETECTED Final   Klebsiella aerogenes NOT DETECTED NOT DETECTED Final   Klebsiella oxytoca NOT DETECTED NOT DETECTED Final   Klebsiella pneumoniae NOT DETECTED NOT DETECTED Final   Proteus species NOT DETECTED NOT DETECTED Final   Salmonella species NOT DETECTED NOT DETECTED Final   Serratia marcescens NOT DETECTED NOT DETECTED Final   Haemophilus influenzae NOT DETECTED NOT DETECTED Final   Neisseria meningitidis NOT DETECTED NOT DETECTED Final   Pseudomonas aeruginosa NOT DETECTED NOT DETECTED Final   Stenotrophomonas maltophilia NOT DETECTED NOT DETECTED Final   Candida albicans NOT DETECTED NOT DETECTED Final   Candida auris NOT DETECTED NOT DETECTED Final   Candida glabrata NOT DETECTED NOT DETECTED Final   Candida krusei NOT DETECTED NOT DETECTED Final   Candida parapsilosis NOT DETECTED NOT DETECTED Final   Candida tropicalis NOT DETECTED NOT DETECTED Final   Cryptococcus neoformans/gattii NOT DETECTED NOT DETECTED Final    Comment: Performed at [facility], [address] [place], [place] [address]  Culture, blood (Routine x 2)     Status: None (Preliminary result)   Collection Time: 06/17/20  2:17 PM   Specimen: BLOOD  Result Value Ref Range Status   Specimen Description BLOOD RIGHT ASSIST CONTROL  Final   Special Requests   Final    BOTTLES DRAWN AEROBIC AND ANAEROBIC Blood Culture results may not be optimal due to an excessive volume of blood received in culture bottles   Culture   Final    NO GROWTH < 24 HOURS Performed  at [facility], [address] [place], [place] [address]    Report Status PENDING  Incomplete  Culture, blood (Routine x 2)     Status: None (Preliminary result)   Collection Time: 06/17/20  3:12 PM   Specimen: BLOOD  Result Value Ref Range Status   Specimen Description BLOOD LEFT ANTECUBITAL  Final   Special Requests   Final    BOTTLES DRAWN AEROBIC AND ANAEROBIC Blood Culture adequate volume   Culture   Final    NO GROWTH < 12 HOURS Performed at [facility], [address] [place], [place] [address]    Report Status PENDING  Incomplete  Respiratory Panel by RT PCR (Flu A&B, Covid) - Nasopharyngeal Swab     Status: None   Collection Time: 06/17/20  7:07 PM   Specimen: Nasopharyngeal Swab  Result Value Ref Range Status   SARS Coronavirus 2 by RT PCR NEGATIVE NEGATIVE Final    Comment: (NOTE) SARS-CoV-2 target nucleic acids are NOT DETECTED.  The SARS-CoV-2 RNA is generally detectable in upper respiratoy specimens during the acute phase of infection. The lowest concentration of SARS-CoV-2 viral copies this assay can detect is 131  copies/mL. A negative result does not preclude SARS-Cov-2 infection and should not be used as the sole basis for treatment or other patient management decisions. A negative result may occur with  improper specimen collection/handling, submission of specimen other than nasopharyngeal swab, presence of viral mutation(s) within the areas targeted by this assay, and inadequate number of viral copies (<131 copies/mL). A negative result must be combined with clinical observations, patient history, and epidemiological information. The expected result is Negative.  Fact Sheet for Patients:  https://www.moore.com/  Fact Sheet for Healthcare Providers:  https://www.young.biz/  This test is no t yet approved or cleared by the [place] FDA and  has been authorized for detection and/or diagnosis  of SARS-CoV-2 by FDA under an Emergency Use Authorization (EUA). This EUA will remain  in effect (meaning this test can be used) for the duration of the COVID-19 declaration under Section 564(b)(1) of the Act, 21 U.S.C. section 360bbb-3(b)(1), unless the authorization is terminated or revoked sooner.     Influenza A by PCR NEGATIVE NEGATIVE Final   Influenza B by PCR NEGATIVE NEGATIVE Final    Comment: (NOTE) The Xpert Xpress SARS-CoV-2/FLU/RSV assay is intended as an aid in  the diagnosis of influenza from Nasopharyngeal swab specimens and  should not be used as a sole basis for treatment. Nasal washings and  aspirates are unacceptable for Xpert Xpress SARS-CoV-2/FLU/RSV  testing.  Fact Sheet for Patients: https://www.moore.com/  Fact Sheet for Healthcare Providers: https://www.young.biz/  This test is not yet approved or cleared by the [place] FDA and  has been authorized for detection and/or diagnosis of SARS-CoV-2 by  FDA under an Emergency Use Authorization (EUA). This EUA will remain  in effect (meaning this test can be used) for the duration of the  Covid-19 declaration under Section 564(b)(1) of the Act, 21  U.S.C. section 360bbb-3(b)(1), unless the authorization is  terminated or revoked. Performed at [facility], [address] [place], [place] [address]     IMAGING RESULTS:  I have personally reviewed the films Right-sided pleuroparenchymal scarring including round atelectasis. 3. Circumferential esophageal thickening in this patient with history of gastroesophageal reflux? Impression/Recommendation ? ?Staph capitis bacteremia on both aerobic bottles from the left cubital and anaerobic bottle from the right cubital culture. Not sure of the significance-? Pathogen   Chronic right lower lobe infiltrate and loculated pleural effusion since May 2021 ? Infection VS atelectasis Pt is asymptomatic and looking  well  Repeat cultures have been sent before antibiotic was started and hence we will see what that shows. Change Iv vancomycin to cefazolin ? _Diabetes mellitus Last hemoglobin A1c is around 8 On sliding scale currently  Schizophrenia  __________________________________________________ Discussed with care team Note:  This document was prepared using Dragon voice recognition software and may include unintentional dictation errors.

## 2020-06-18 NOTE — Plan of Care (Signed)
Continuing with plan of care. 

## 2020-06-18 NOTE — Progress Notes (Signed)
PROGRESS NOTE    PEPE MINEAU   ZOX:[id number]  DOB: [birth year]  PCP: Franciso Bend, NP    DOA: 06/17/2020 LOS: 1   Brief Narrative   Johnny [name] is a 64 y.o. male with medical history significant for schizophrenia, hypertension, hyperlipidemia, diabetes who presented to the ED on 10/7 due to bacteremia, after blood cultures drawn during ED visit on 10/5.  He was seen that day for chest discomfort and cough, diagnosed with pneumonia, treated with IV antibiotics and discharged to his group home with oral antibiotics.  Blood cultures subsequently positive for Staph capitis.  Unknown source of this infection, but noted to have very poor dentition.   Started on IV Vancomycin in the ED and admitted to hospitalist service with ID consulted for further evaluation and management.      Assessment & Plan   Principal Problem:   Bacteremia Active Problems:   Type 2 diabetes mellitus with hyperlipidemia (HCC)   Schizophrenia (HCC)   Essential hypertension   Staph capitis bacteremia - present on admission, blood cultures from ED visit 10/5 growing staph capitis.  Possible source being poor denition, now known hardware or implants.  No prosthetic device per chart review. No findings on exam suggestive of endocarditis --Continue IV Vancomycin --Repeat blood cultures pending, neg to date --ID consulted  Recent tx of pneumonia - low suspicion for significant pneumonia. Held Azithromycin that was prescribed in ED on 10/5.  On Vanc as above.  Monitor for respiratory symptoms.   Schizophrenia - appears stable.  Med history is pending.  Resume meds once verified.  Type 2 Diabetes - sliding scale Novolog  HTN - chronic, stable, soft BP on admission 102/54.  Med history pending.  PRN oral hydralazine for now.      DVT prophylaxis: enoxaparin (LOVENOX) injection 40 mg Start: 06/17/20 2000   Diet:  Diet Orders (From admission, onward)    Start     Ordered   06/17/20 1905   Diet Heart Room service appropriate? Yes; Fluid consistency: Thin  Diet effective now       Question Answer Comment  Room service appropriate? Yes   Fluid consistency: Thin      06/17/20 1905            Code Status: Full Code    Subjective 06/18/20    Pt seen at bedside this AM.  His first words to me were a highly inappropriate personal question which I dismissed.  He denies having fever/chills or any other symptoms.  No acute events reported.   Disposition Plan & Communication   Status is: Inpatient  Remains inpatient appropriate because:IV treatments appropriate due to intensity of illness or inability to take PO   Dispo: The patient is from: Group home              Anticipated d/c is to: Group home              Anticipated d/c date is: 2 days              Patient currently is not medically stable to d/c.   Family Communication: none at bedside, will attempt to call    Consults, Procedures, Significant Events   Consultants:   Infectious disease  Procedures:   None  Antimicrobials:  Anti-infectives (From admission, onward)   Start     Dose/Rate Route Frequency Ordered Stop   06/18/20 1400  ceFAZolin (ANCEF) IVPB 2g/100 mL premix  2 g 200 mL/hr over 30 Minutes Intravenous Every 8 hours 06/18/20 1200     06/18/20 0600  vancomycin (VANCOCIN) IVPB 1000 mg/200 mL premix  Status:  Discontinued        1,000 mg 200 mL/hr over 60 Minutes Intravenous Every 12 hours 06/17/20 1925 06/18/20 1141   06/17/20 1730  vancomycin (VANCOREADY) IVPB 1750 mg/350 mL        1,750 mg 175 mL/hr over 120 Minutes Intravenous  Once 06/17/20 1726 06/17/20 2032        Objective   Vitals:   06/17/20 2357 06/18/20 0538 06/18/20 0900 06/18/20 1145  BP: 128/67 122/65 129/75 131/70  Pulse: 73 66 66 65  Resp: Temp: 98 F (36.7 C) 99.7 F (37.6 C)  99 F (37.2 C)  TempSrc: Oral Oral  Oral  SpO2: 100% 98% 99% 100%  Weight:      Height:        Intake/Output  Summary (Last 24 hours) at 06/18/2020 1340 Last data filed at 06/18/2020 1300 Gross per 24 hour  Intake 2590 ml  Output 2000 ml  Net 590 ml   Filed Weights   06/17/20 1410  Weight: 72.6 kg    Physical Exam:  General exam: awake, alert, no acute distress Respiratory system: CTAB, no wheezes, rales or rhonchi, normal respiratory effort. Cardiovascular system: normal S1/S2, RRR, no JVD, murmurs, rubs, gallops, no pedal edema.   Gastrointestinal system: soft, NT, ND, +bowel sounds. Central nervous system: A&O x3. no gross focal neurologic deficits, normal speech Skin: dry, intact, normal temperature, normal color Psychiatry: normal mood, congruent affect  Labs   Data Reviewed: I have personally reviewed following labs and imaging studies  CBC: Recent Labs  Lab 06/14/20 1650 06/17/20 1412 06/18/20 0459  WBC 15.5* 9.4 7.4  NEUTROABS  --  6.1  --   HGB 15.0 14.5 12.4*  HCT 42.4 42.3 36.2*  MCV 81.7 83.4 85.2  PLT 267 219 173   Basic Metabolic Panel: Recent Labs  Lab 06/14/20 1650 06/17/20 1412 06/18/20 0459  NA 131* 129* 135  K 4.8 3.9 4.1  CL 93* 95* 103  CO2 GLUCOSE 193* 230* 220*  BUN 53* 38* 27*  CREATININE 1.35* 1.24 1.02  CALCIUM 9.4 8.9 8.6*   GFR: Estimated Creatinine Clearance: 78.1 mL/min (by C-G formula based on SCr of 1.02 mg/dL). Liver Function Tests: Recent Labs  Lab 06/17/20 1412  AST 17  ALT 13  ALKPHOS 81  BILITOT 0.5  PROT 6.9  ALBUMIN 3.7   No results for input(s): LIPASE, AMYLASE in the last 168 hours. No results for input(s): AMMONIA in the last 168 hours. Coagulation Profile: Recent Labs  Lab 06/17/20 1412  INR 0.9   Cardiac Enzymes: No results for input(s): CKTOTAL, CKMB, CKMBINDEX, TROPONINI in the last 168 hours. BNP (last 3 results) No results for input(s): PROBNP in the last 8760 hours. HbA1C: Recent Labs    06/17/20 1415  HGBA1C 8.0*   CBG: Recent Labs  Lab 06/17/20 2153 06/18/20 0752 06/18/20 1143    GLUCAP 236* 181* 141*   Lipid Profile: No results for input(s): CHOL, HDL, LDLCALC, TRIG, CHOLHDL, LDLDIRECT in the last 72 hours. Thyroid Function Tests: No results for input(s): TSH, T4TOTAL, FREET4, T3FREE, THYROIDAB in the last 72 hours. Anemia Panel: No results for input(s): VITAMINB12, FOLATE, FERRITIN, TIBC, IRON, RETICCTPCT in the last 72 hours. Sepsis Labs: Recent Labs  Lab 06/15/20 0403 06/17/20 1412 06/17/20 1612  06/17/20 1907 06/18/20 0459  PROCALCITON  --   --   --   --  1.63  LATICACIDVEN 1.9 1.9 2.3* 1.2  --     Recent Results (from the past 240 hour(s))  Blood Culture (routine x 2)     Status: Abnormal   Collection Time: 06/15/20  4:03 AM   Specimen: BLOOD  Result Value Ref Range Status   Specimen Description   Final    BLOOD LEFT ANTECUBITAL Performed at [facility], [address], [place], [place] [address]    Special Requests   Final    BOTTLES DRAWN AEROBIC AND ANAEROBIC Blood Culture adequate volume Performed at [facility], [address] [place], [place] [address]    Culture  Setup Time   Final    AEROBIC BOTTLE ONLY GRAM POSITIVE COCCI CRITICAL RESULT CALLED TO, READ BACK BY AND VERIFIED WITH: JASON ROBBINS 06/15/20 AT 2234 BY ACR Performed at [facility], [address] [place], [place] [address]    Culture STAPHYLOCOCCUS CAPITIS (A)  Final   Report Status 06/18/2020 FINAL  Final   Organism ID, Bacteria STAPHYLOCOCCUS CAPITIS  Final      Susceptibility   Staphylococcus capitis - MIC*    CIPROFLOXACIN <=0.5 SENSITIVE Sensitive     ERYTHROMYCIN <=0.25 SENSITIVE Sensitive     GENTAMICIN <=0.5 SENSITIVE Sensitive     OXACILLIN <=0.25 SENSITIVE Sensitive     TETRACYCLINE <=1 SENSITIVE Sensitive     VANCOMYCIN <=0.5 SENSITIVE Sensitive     TRIMETH/SULFA <=10 SENSITIVE Sensitive     CLINDAMYCIN <=0.25 SENSITIVE Sensitive     RIFAMPIN <=0.5 SENSITIVE Sensitive     Inducible Clindamycin NEGATIVE Sensitive     *  STAPHYLOCOCCUS CAPITIS  Blood Culture (routine x 2)     Status: Abnormal   Collection Time: 06/15/20  4:03 AM   Specimen: BLOOD  Result Value Ref Range Status   Specimen Description   Final    BLOOD RIGHT ANTECUBITAL Performed at [facility], [address], [place], [place] [address]    Special Requests   Final    BOTTLES DRAWN AEROBIC AND ANAEROBIC Blood Culture results may not be optimal due to an excessive volume of blood received in culture bottles Performed at [facility], [address] [place], [place] [address]    Culture  Setup Time   Final    GRAM POSITIVE COCCI ANAEROBIC BOTTLE ONLY CRITICAL VALUE NOTED.  VALUE IS CONSISTENT WITH PREVIOUSLY REPORTED AND CALLED VALUE.    Culture (A)  Final    STAPHYLOCOCCUS CAPITIS SUSCEPTIBILITIES PERFORMED ON PREVIOUS CULTURE WITHIN THE LAST 5 DAYS. Performed at [facility], [address] [place], [place] [address]    Report Status 06/18/2020 FINAL  Final  Respiratory Panel by RT PCR (Flu A&B, Covid) - Nasopharyngeal Swab     Status: None   Collection Time: 06/15/20  4:03 AM   Specimen: Nasopharyngeal Swab  Result Value Ref Range Status   SARS Coronavirus 2 by RT PCR NEGATIVE NEGATIVE Final    Comment: (NOTE) SARS-CoV-2 target nucleic acids are NOT DETECTED.  The SARS-CoV-2 RNA is generally detectable in upper respiratoy specimens during the acute phase of infection. The lowest concentration of SARS-CoV-2 viral copies this assay can detect is 131 copies/mL. A negative result does not preclude SARS-Cov-2 infection and should not be used as the sole basis for treatment or other patient management decisions. A negative result may occur with  improper specimen  collection/handling, submission of specimen other than nasopharyngeal swab, presence of viral mutation(s) within the areas targeted by this assay, and inadequate number of viral copies (<131 copies/mL). A negative result must be combined with  clinical observations, patient history, and epidemiological information. The expected result is Negative.  Fact Sheet for Patients:  https://www.moore.com/https://www.fda.gov/media/142436/download  Fact Sheet for Healthcare Providers:  https://www.young.biz/https://www.fda.gov/media/142435/download  This test is no t yet approved or cleared by the [place] FDA and  has been authorized for detection and/or diagnosis of SARS-CoV-2 by FDA under an Emergency Use Authorization (EUA). This EUA will remain  in effect (meaning this test can be used) for the duration of the COVID-19 declaration under Section 564(b)(1) of the Act, 21 U.S.C. section 360bbb-3(b)(1), unless the authorization is terminated or revoked sooner.     Influenza A by PCR NEGATIVE NEGATIVE Final   Influenza B by PCR NEGATIVE NEGATIVE Final    Comment: (NOTE) The Xpert Xpress SARS-CoV-2/FLU/RSV assay is intended as an aid in  the diagnosis of influenza from Nasopharyngeal swab specimens and  should not be used as a sole basis for treatment. Nasal washings and  aspirates are unacceptable for Xpert Xpress SARS-CoV-2/FLU/RSV  testing.  Fact Sheet for Patients: https://www.moore.com/https://www.fda.gov/media/142436/download  Fact Sheet for Healthcare Providers: https://www.young.biz/https://www.fda.gov/media/142435/download  This test is not yet approved or cleared by the [place] FDA and  has been authorized for detection and/or diagnosis of SARS-CoV-2 by  FDA under an Emergency Use Authorization (EUA). This EUA will remain  in effect (meaning this test can be used) for the duration of the  Covid-19 declaration under Section 564(b)(1) of the Act, 21  U.S.C. section 360bbb-3(b)(1), unless the authorization is  terminated or revoked. Performed at [facility], [address] [place], [place] [address]   Blood Culture ID Panel (Reflexed)     Status: Abnormal   Collection Time: 06/15/20  4:03 AM  Result Value Ref Range Status   Enterococcus faecalis NOT DETECTED NOT DETECTED Final     Enterococcus Faecium NOT DETECTED NOT DETECTED Final   Listeria monocytogenes NOT DETECTED NOT DETECTED Final   Staphylococcus species DETECTED (A) NOT DETECTED Final    Comment: CRITICAL RESULT CALLED TO, READ BACK BY AND VERIFIED WITH: JASON ROBBINS 06/15/20 AT 2234 BY ACR    Staphylococcus aureus (BCID) NOT DETECTED NOT DETECTED Final   Staphylococcus epidermidis NOT DETECTED NOT DETECTED Final   Staphylococcus lugdunensis NOT DETECTED NOT DETECTED Final   Streptococcus species NOT DETECTED NOT DETECTED Final   Streptococcus agalactiae NOT DETECTED NOT DETECTED Final   Streptococcus pneumoniae NOT DETECTED NOT DETECTED Final   Streptococcus pyogenes NOT DETECTED NOT DETECTED Final   A.calcoaceticus-baumannii NOT DETECTED NOT DETECTED Final   Bacteroides fragilis NOT DETECTED NOT DETECTED Final   Enterobacterales NOT DETECTED NOT DETECTED Final   Enterobacter cloacae complex NOT DETECTED NOT DETECTED Final   Escherichia coli NOT DETECTED NOT DETECTED Final   Klebsiella aerogenes NOT DETECTED NOT DETECTED Final   Klebsiella oxytoca NOT DETECTED NOT DETECTED Final   Klebsiella pneumoniae NOT DETECTED NOT DETECTED Final   Proteus species NOT DETECTED NOT DETECTED Final   Salmonella species NOT DETECTED NOT DETECTED Final   Serratia marcescens NOT DETECTED NOT DETECTED Final   Haemophilus influenzae NOT DETECTED NOT DETECTED Final   Neisseria meningitidis NOT DETECTED NOT DETECTED Final   Pseudomonas aeruginosa NOT DETECTED NOT DETECTED Final   Stenotrophomonas maltophilia NOT DETECTED NOT DETECTED Final   Candida albicans NOT DETECTED NOT DETECTED Final   Candida  auris NOT DETECTED NOT DETECTED Final   Candida glabrata NOT DETECTED NOT DETECTED Final   Candida krusei NOT DETECTED NOT DETECTED Final   Candida parapsilosis NOT DETECTED NOT DETECTED Final   Candida tropicalis NOT DETECTED NOT DETECTED Final   Cryptococcus neoformans/gattii NOT DETECTED NOT DETECTED Final    Comment:  Performed at [facility], [address] [place], [place] [address]  Culture, blood (Routine x 2)     Status: None (Preliminary result)   Collection Time: 06/17/20  2:17 PM   Specimen: BLOOD  Result Value Ref Range Status   Specimen Description BLOOD RIGHT ASSIST CONTROL  Final   Special Requests   Final    BOTTLES DRAWN AEROBIC AND ANAEROBIC Blood Culture results may not be optimal due to an excessive volume of blood received in culture bottles   Culture   Final    NO GROWTH < 24 HOURS Performed at [facility], [address], [place], [place] [address]    Report Status PENDING  Incomplete  Culture, blood (Routine x 2)     Status: None (Preliminary result)   Collection Time: 06/17/20  3:12 PM   Specimen: BLOOD  Result Value Ref Range Status   Specimen Description BLOOD LEFT ANTECUBITAL  Final   Special Requests   Final    BOTTLES DRAWN AEROBIC AND ANAEROBIC Blood Culture adequate volume   Culture   Final    NO GROWTH < 12 HOURS Performed at [facility], [address] [place], [place] [address]    Report Status PENDING  Incomplete  Respiratory Panel by RT PCR (Flu A&B, Covid) - Nasopharyngeal Swab     Status: None   Collection Time: 06/17/20  7:07 PM   Specimen: Nasopharyngeal Swab  Result Value Ref Range Status   SARS Coronavirus 2 by RT PCR NEGATIVE NEGATIVE Final    Comment: (NOTE) SARS-CoV-2 target nucleic acids are NOT DETECTED.  The SARS-CoV-2 RNA is generally detectable in upper respiratoy specimens during the acute phase of infection. The lowest concentration of SARS-CoV-2 viral copies this assay can detect is 131 copies/mL. A negative result does not preclude SARS-Cov-2 infection and should not be used as the sole basis for treatment or other patient management decisions. A negative result may occur with  improper specimen collection/handling, submission of specimen other than nasopharyngeal swab, presence of viral mutation(s)  within the areas targeted by this assay, and inadequate number of viral copies (<131 copies/mL). A negative result must be combined with clinical observations, patient history, and epidemiological information. The expected result is Negative.  Fact Sheet for Patients:  https://www.moore.com/  Fact Sheet for Healthcare Providers:  https://www.young.biz/  This test is no t yet approved or cleared by the [place] FDA and  has been authorized for detection and/or diagnosis of SARS-CoV-2 by FDA under an Emergency Use Authorization (EUA). This EUA will remain  in effect (meaning this test can be used) for the duration of the COVID-19 declaration under Section 564(b)(1) of the Act, 21 U.S.C. section 360bbb-3(b)(1), unless the authorization is terminated or revoked sooner.     Influenza A by PCR NEGATIVE NEGATIVE Final   Influenza B by PCR NEGATIVE NEGATIVE Final    Comment: (NOTE) The Xpert Xpress SARS-CoV-2/FLU/RSV assay is intended as an aid in  the diagnosis of influenza from Nasopharyngeal swab specimens and  should not be used as a sole basis for treatment. Nasal washings and  aspirates are unacceptable for Xpert Xpress SARS-CoV-2/FLU/RSV  testing.  Fact Sheet for Patients: https://www.moore.com/  Fact Sheet for Healthcare Providers: https://www.young.biz/  This test is not yet approved or cleared by the [place] FDA and  has been authorized for detection and/or diagnosis of SARS-CoV-2 by  FDA under an Emergency Use Authorization (EUA). This EUA will remain  in effect (meaning this test can be used) for the duration of the  Covid-19 declaration under Section 564(b)(1) of the Act, 21  U.S.C. section 360bbb-3(b)(1), unless the authorization is  terminated or revoked. Performed at [facility], [address] [place], [place] [address]       Imaging Studies   DG Chest 2  View  Result Date: 06/17/2020 CLINICAL DATA:  Staph bacteremia. EXAM: CHEST - 2 VIEW COMPARISON:  06/14/2020 FINDINGS: Unchanged atelectasis is again noted in the posterior right lower lung zone. There is a trace right-sided pleural effusion. No pneumothorax. Aortic calcifications are noted. There is no acute osseous abnormality. IMPRESSION: No significant short interval change. Electronically Signed   By: Katherine Mantle M.D.   On: 06/17/2020 19:28     Medications   Scheduled Meds: . enoxaparin (LOVENOX) injection  40 mg Subcutaneous Q24H  . insulin aspart  0-5 Units Subcutaneous QHS  . insulin aspart  0-9 Units Subcutaneous TID WC   Continuous Infusions: .  ceFAZolin (ANCEF) IV         LOS: 1 day    Time spent: 30 minutes    Pennie Banter, DO [facility]  06/18/2020, 1:40 PM    If 7PM-7AM, please contact night-coverage. How to contact the [facility] Attending or Consulting provider 7A - 7P or covering provider during after hours 7P -7A, for this patient?    1. Check the care team in [facility] and look for a) attending/consulting TR[facility] provider listed and b) the [facility] team listed 2. Log into [email or web address] and use [place]'s universal password to access. If you do not have the password, please contact the hospital operator. 3. Locate the [facility] provider you are looking for under [facility] and page to a number that you can be directly reached. 4. If you still have difficulty reaching the provider, please page the [facility] (Director on Call) for the Hospitalists listed on amion for assistance.

## 2020-06-18 NOTE — Progress Notes (Signed)
Pharmacy Antibiotic Note  Johnny [name] is a 64 y.o. male admitted on 06/17/2020. Patient recently seen in ED 10/5 for chest pain, later discharged same day. Blood cultures drawn at that visit with 2 bottles (2/2 sets) staph capitis (previously reported 1/4 bottles). Patient has returned to the hospital per MD recommendation. Pharmacy has now been consulted for cefazolin dosing. ID consulted  Plan: Cefazolin 2 gm IV q8h   Height: 6' (182.9 cm) Weight: 72.6 kg (160 lb) IBW/kg (Calculated) : 77.6  Temp (24hrs), Avg:98.7 F (37.1 C), Min:98 F (36.7 C), Max:99.7 F (37.6 C)  Recent Labs  Lab 06/14/20 1650 06/15/20 0403 06/17/20 1412 06/17/20 1612 06/17/20 1907 06/18/20 0459  WBC 15.5*  --  9.4  --   --  7.4  CREATININE 1.35*  --  1.24  --   --  1.02  LATICACIDVEN  --  1.9 1.9 2.3* 1.2  --     Estimated Creatinine Clearance: 78.1 mL/min (by C-G formula based on SCr of 1.02 mg/dL).    Allergies  Allergen Reactions   Fish Allergy Rash   Shellfish Allergy Rash    Antimicrobials this admission: Vancomycin 10/7 >> 10/8 Cefazolin 10/8 >>   Microbiology results: 10/5 BCx: 2/4 bottles (2/2 sets) staph capitis 10/7 BCx: pending    Thank you for allowing pharmacy to be a part of this patients care.  Jihan Rudy A, PharmD 06/18/2020 12:27 PM

## 2020-06-18 NOTE — Hospital Course (Signed)
Johnny [name] is a 64 y.o. male with medical history significant for schizophrenia, hypertension, hyperlipidemia, diabetes who presented to the ED on 10/7 due to bacteremia, after blood cultures drawn during ED visit on 10/5.  He was seen that day for chest discomfort and cough, diagnosed with pneumonia, treated with IV antibiotics and discharged to his group home with oral antibiotics.  Blood cultures subsequently positive for Staph capitis.  Unknown source of this infection, but noted to have very poor dentition.   Started on IV Vancomycin in the ED and admitted to hospitalist service with ID consulted for further evaluation and management.

## 2020-06-18 NOTE — Progress Notes (Signed)
Inpatient Diabetes Program Recommendations  AACE/ADA: New Consensus Statement on Inpatient Glycemic Control (2015)  Target Ranges:  Prepandial:   less than 140 mg/dL      Peak postprandial:   less than 180 mg/dL (1-2 hours)      Critically ill patients:  140 - 180 mg/dL   Results for EDVARDO, HONSE (MRN [id number]) as of 06/18/2020 09:04  Ref. Range 06/17/2020 21:53 06/18/2020 07:52  Glucose-Capillary Latest Ref Range: 70 - 99 mg/dL 644 (H)  2 units NOVOLOG  181 (H)      Admit with Bacteremia  History: DM, Schizophrenia  Home DM Meds: Lantus 30 units QHS + Invokamet 50/1000  mg BID + Januvia 100 mg Daily + Actos 45 mg Daily  Current Orders: Novolog 0-9 units ac/hs    MD- Please consider starting Lantus 15 units QHS (50% home dose to start)     --Will follow patient during hospitalization--  Ambrose Finland RN, MSN, CDE Diabetes Coordinator Inpatient Glycemic Control Team Team Pager: [phone] (8a-5p)

## 2020-06-19 LAB — CBC
HCT: 37.1 % — ABNORMAL LOW (ref 39.0–52.0)
Hemoglobin: 12.6 g/dL — ABNORMAL LOW (ref 13.0–17.0)
MCH: 28.4 pg (ref 26.0–34.0)
MCHC: 34 g/dL (ref 30.0–36.0)
MCV: 83.7 fL (ref 80.0–100.0)
Platelets: 171 10*3/uL (ref 150–400)
RBC: 4.43 MIL/uL (ref 4.22–5.81)
RDW: 15.6 % — ABNORMAL HIGH (ref 11.5–15.5)
WBC: 12.1 10*3/uL — ABNORMAL HIGH (ref 4.0–10.5)
nRBC: 0 % (ref 0.0–0.2)

## 2020-06-19 LAB — BASIC METABOLIC PANEL
Anion gap: 8 (ref 5–15)
BUN: 21 mg/dL (ref 8–23)
CO2: 25 mmol/L (ref 22–32)
Calcium: 8.8 mg/dL — ABNORMAL LOW (ref 8.9–10.3)
Chloride: 98 mmol/L (ref 98–111)
Creatinine, Ser: 0.87 mg/dL (ref 0.61–1.24)
GFR, Estimated: >60 mL/min (ref >60)
Glucose, Bld: 160 mg/dL — ABNORMAL HIGH (ref 70–99)
Potassium: 3.9 mmol/L (ref 3.5–5.1)
Sodium: 131 mmol/L — ABNORMAL LOW (ref 135–145)

## 2020-06-19 LAB — GLUCOSE, CAPILLARY
Glucose-Capillary: 187 mg/dL — ABNORMAL HIGH (ref 70–99)
Glucose-Capillary: 211 mg/dL — ABNORMAL HIGH (ref 70–99)
Glucose-Capillary: 124 mg/dL — ABNORMAL HIGH (ref 70–99)
Glucose-Capillary: 133 mg/dL — ABNORMAL HIGH (ref 70–99)
Glucose-Capillary: 165 mg/dL — ABNORMAL HIGH (ref 70–99)

## 2020-06-19 LAB — PROCALCITONIN: Procalcitonin: 0.61 ng/mL

## 2020-06-19 LAB — MAGNESIUM: Magnesium: 2.1 mg/dL (ref 1.7–2.4)

## 2020-06-19 MED ORDER — FLUPHENAZINE HCL 5 MG PO TABS
5.0000 mg | ORAL_TABLET | Freq: Two times a day (BID) | ORAL | Status: DC
  Administered 2020-06-19 – 2020-06-21 (×5): 5 mg via ORAL
  Filled 2020-06-19 (×6): qty 1

## 2020-06-19 MED ORDER — MEMANTINE HCL-DONEPEZIL HCL ER 28-10 MG PO CP24: 1.0000 | ORAL_CAPSULE | Freq: Every day | ORAL | Status: DC

## 2020-06-19 MED ORDER — TIOTROPIUM BROMIDE MONOHYDRATE 18 MCG IN CAPS
18.0000 ug | ORAL_CAPSULE | Freq: Every day | RESPIRATORY_TRACT | Status: DC
  Administered 2020-06-19 – 2020-06-21 (×3): 18 ug via RESPIRATORY_TRACT
  Filled 2020-06-19: qty 5

## 2020-06-19 MED ORDER — CEFAZOLIN SODIUM-DEXTROSE 2-4 GM/100ML-% IV SOLN
2.0000 g | Freq: Three times a day (TID) | INTRAVENOUS | Status: DC
  Administered 2020-06-19 – 2020-06-20 (×3): 2 g via INTRAVENOUS
  Filled 2020-06-19 (×8): qty 100

## 2020-06-19 MED ORDER — BUPROPION HCL ER (XL) 150 MG PO TB24
300.0000 mg | ORAL_TABLET | Freq: Every day | ORAL | Status: DC
  Administered 2020-06-19 – 2020-06-21 (×3): 300 mg via ORAL
  Filled 2020-06-19 (×3): qty 2

## 2020-06-19 MED ORDER — ALBUTEROL SULFATE (2.5 MG/3ML) 0.083% IN NEBU: 2.5000 mg | INHALATION_SOLUTION | RESPIRATORY_TRACT | Status: DC | PRN

## 2020-06-19 MED ORDER — PRAVASTATIN SODIUM 20 MG PO TABS
40.0000 mg | ORAL_TABLET | Freq: Every day | ORAL | Status: DC
  Administered 2020-06-19 – 2020-06-20 (×2): 40 mg via ORAL
  Filled 2020-06-19 (×2): qty 2

## 2020-06-19 MED ORDER — PANTOPRAZOLE SODIUM 40 MG PO TBEC
40.0000 mg | DELAYED_RELEASE_TABLET | Freq: Every day | ORAL | Status: DC
  Administered 2020-06-19 – 2020-06-21 (×3): 40 mg via ORAL
  Filled 2020-06-19 (×3): qty 1

## 2020-06-19 MED ORDER — INSULIN GLARGINE 100 UNIT/ML ~~LOC~~ SOLN
15.0000 [IU] | Freq: Every day | SUBCUTANEOUS | Status: DC
  Administered 2020-06-19 – 2020-06-20 (×2): 15 [IU] via SUBCUTANEOUS
  Filled 2020-06-19 (×3): qty 0.15

## 2020-06-19 MED ORDER — DONEPEZIL HCL 5 MG PO TABS
10.0000 mg | ORAL_TABLET | Freq: Every day | ORAL | Status: DC
  Administered 2020-06-19 – 2020-06-20 (×2): 10 mg via ORAL
  Filled 2020-06-19 (×3): qty 2

## 2020-06-19 MED ORDER — DIVALPROEX SODIUM ER 500 MG PO TB24
500.0000 mg | ORAL_TABLET | Freq: Two times a day (BID) | ORAL | Status: DC
  Administered 2020-06-19 – 2020-06-21 (×5): 500 mg via ORAL
  Filled 2020-06-19 (×6): qty 1

## 2020-06-19 MED ORDER — UMECLIDINIUM BROMIDE 62.5 MCG/INH IN AEPB
1.0000 | INHALATION_SPRAY | Freq: Every day | RESPIRATORY_TRACT | Status: DC
  Administered 2020-06-19 – 2020-06-21 (×3): 1 via RESPIRATORY_TRACT
  Filled 2020-06-19: qty 7

## 2020-06-19 MED ORDER — ALBUTEROL SULFATE HFA 108 (90 BASE) MCG/ACT IN AERS: 2.0000 | INHALATION_SPRAY | RESPIRATORY_TRACT | Status: DC | PRN

## 2020-06-19 MED ORDER — MOMETASONE FURO-FORMOTEROL FUM 100-5 MCG/ACT IN AERO
2.0000 | INHALATION_SPRAY | Freq: Two times a day (BID) | RESPIRATORY_TRACT | Status: DC
  Administered 2020-06-19 – 2020-06-21 (×5): 2 via RESPIRATORY_TRACT
  Filled 2020-06-19: qty 8.8

## 2020-06-19 MED ORDER — BENZTROPINE MESYLATE 1 MG PO TABS
1.0000 mg | ORAL_TABLET | Freq: Two times a day (BID) | ORAL | Status: DC
  Administered 2020-06-19 – 2020-06-21 (×5): 1 mg via ORAL
  Filled 2020-06-19 (×6): qty 1

## 2020-06-19 MED ORDER — CLONAZEPAM 1 MG PO TABS
1.0000 mg | ORAL_TABLET | Freq: Three times a day (TID) | ORAL | Status: DC
  Administered 2020-06-19 – 2020-06-21 (×8): 1 mg via ORAL
  Filled 2020-06-19 (×8): qty 1

## 2020-06-19 MED ORDER — COLESEVELAM HCL 625 MG PO TABS
1875.0000 mg | ORAL_TABLET | Freq: Two times a day (BID) | ORAL | Status: DC
  Administered 2020-06-19 – 2020-06-21 (×5): 1875 mg via ORAL
  Filled 2020-06-19 (×6): qty 3

## 2020-06-19 MED ORDER — BENAZEPRIL HCL 20 MG PO TABS
20.0000 mg | ORAL_TABLET | Freq: Every day | ORAL | Status: DC
  Administered 2020-06-19 – 2020-06-21 (×3): 20 mg via ORAL
  Filled 2020-06-19 (×3): qty 1

## 2020-06-19 MED ORDER — TRAZODONE HCL 100 MG PO TABS
200.0000 mg | ORAL_TABLET | Freq: Every day | ORAL | Status: DC
  Administered 2020-06-19 – 2020-06-20 (×2): 200 mg via ORAL
  Filled 2020-06-19 (×2): qty 2

## 2020-06-19 MED ORDER — BUDESON-GLYCOPYRROL-FORMOTEROL 160-9-4.8 MCG/ACT IN AERO: 2.0000 | INHALATION_SPRAY | Freq: Two times a day (BID) | RESPIRATORY_TRACT | Status: DC

## 2020-06-19 MED ORDER — LINAGLIPTIN 5 MG PO TABS
5.0000 mg | ORAL_TABLET | Freq: Every day | ORAL | Status: DC
  Administered 2020-06-19 – 2020-06-21 (×3): 5 mg via ORAL
  Filled 2020-06-19 (×3): qty 1

## 2020-06-19 MED ORDER — QUETIAPINE FUMARATE 300 MG PO TABS
600.0000 mg | ORAL_TABLET | Freq: Every day | ORAL | Status: DC
  Administered 2020-06-19 – 2020-06-20 (×2): 600 mg via ORAL
  Filled 2020-06-19 (×3): qty 2

## 2020-06-19 MED ORDER — LINACLOTIDE 145 MCG PO CAPS
145.0000 ug | ORAL_CAPSULE | Freq: Every day | ORAL | Status: DC
  Administered 2020-06-19 – 2020-06-21 (×3): 145 ug via ORAL
  Filled 2020-06-19 (×3): qty 1

## 2020-06-19 MED ORDER — POLYETHYLENE GLYCOL 3350 17 G PO PACK
17.0000 g | PACK | Freq: Every day | ORAL | Status: DC
  Administered 2020-06-19 – 2020-06-21 (×2): 17 g via ORAL
  Filled 2020-06-19 (×2): qty 1

## 2020-06-19 MED ORDER — PIOGLITAZONE HCL 30 MG PO TABS
45.0000 mg | ORAL_TABLET | Freq: Every day | ORAL | Status: DC
  Administered 2020-06-19 – 2020-06-21 (×3): 45 mg via ORAL
  Filled 2020-06-19 (×3): qty 1

## 2020-06-19 MED ORDER — HYDROCHLOROTHIAZIDE 12.5 MG PO CAPS
12.5000 mg | ORAL_CAPSULE | Freq: Every day | ORAL | Status: DC
  Administered 2020-06-19 – 2020-06-21 (×3): 12.5 mg via ORAL
  Filled 2020-06-19 (×2): qty 1

## 2020-06-19 MED ORDER — MEMANTINE HCL ER 28 MG PO CP24
28.0000 mg | ORAL_CAPSULE | Freq: Every day | ORAL | Status: DC
  Administered 2020-06-19 – 2020-06-20 (×2): 28 mg via ORAL
  Filled 2020-06-19 (×3): qty 1

## 2020-06-19 MED ORDER — ASPIRIN 81 MG PO CHEW
81.0000 mg | CHEWABLE_TABLET | Freq: Every day | ORAL | Status: DC
  Administered 2020-06-19 – 2020-06-21 (×3): 81 mg via ORAL
  Filled 2020-06-19 (×3): qty 1

## 2020-06-19 MED ORDER — PALIPERIDONE PALMITATE ER 819 MG/2.625ML IM SUSY: 819.0000 mg | PREFILLED_SYRINGE | INTRAMUSCULAR | Status: DC

## 2020-06-19 MED ORDER — VITAMIN D (ERGOCALCIFEROL) 1.25 MG (50000 UNIT) PO CAPS
50000.0000 [IU] | ORAL_CAPSULE | ORAL | Status: DC
  Administered 2020-06-19: 50000 [IU] via ORAL
  Filled 2020-06-19: qty 1

## 2020-06-19 NOTE — Progress Notes (Signed)
Mobility Specialist - Progress Note   06/19/20 1357  Mobility  Activity Ambulated in room;Ambulated in hall  Level of Assistance Standby assist, set-up cues, supervision of patient - no hands on (CGA for safety)  Assistive Device None  Distance Ambulated (ft) 140 ft  Mobility Response Tolerated well  Mobility performed by Mobility specialist  $Mobility charge 1 Mobility    Pt long-sitting in bed upon arrival. Pt received wearing hospital gloves and gown backwards. Mobility specialist assisted w/ correcting gown. Pt agreed to session. Pt confused but pleasant. Easily follows commands and VCs. Pt ambulates independently in room. Noted IV needle dangling from IV pole. IV and EKG were disconnected from him. Nurse was notified. Furthermore, pt ambulated ~140' total in hallway w/ SBA. CGA utilized for safety. Noted pt has a narrow gait. Pt cued to widen step pattern to prevent risk of injury. Pt corrected gait pattern. Pt response to anything said was "yes ma'am" t/o session. Overall, pt tolerated session well. Pt left sitting EOB w/ all needs placed in reach. Nurse was notified.    Suetta Hoffmeister Mobility Specialist  06/19/20, 2:11 PM

## 2020-06-19 NOTE — Plan of Care (Signed)
Continuing with plan of care. 

## 2020-06-19 NOTE — Progress Notes (Signed)
PROGRESS NOTE    Johnny [name]   FGH:[id number]  DOB: 07-Oct-1955  PCP: Franciso Bend, NP    DOA: 06/17/2020 LOS: 2   Brief Narrative   Johnny [name] is a 64 y.o. male with medical history significant for schizophrenia, hypertension, hyperlipidemia, diabetes who presented to the ED on 10/7 due to bacteremia, after blood cultures drawn during ED visit on 10/5.  He was seen that day for chest discomfort and cough, diagnosed with pneumonia, treated with IV antibiotics and discharged to his group home with oral antibiotics.  Blood cultures subsequently positive for Staph capitis.  Unknown source of this infection, but noted to have very poor dentition.   Started on IV Vancomycin in the ED and admitted to hospitalist service with ID consulted for further evaluation and management.      Assessment & Plan   Principal Problem:   Bacteremia Active Problems:   Type 2 diabetes mellitus with hyperlipidemia (HCC)   Schizophrenia (HCC)   Essential hypertension   Staph capitis bacteremia - present on admission, blood cultures from ED visit 10/5 growing staph capitis.  Possible source being poor denition, now known hardware or implants, no relevant skin findings.  No prosthetic device per chart review. No findings on exam suggestive of endocarditis. --Initially started on IV Vancomycin >> Cefazolin on 10/8 --Repeat blood cultures pending, neg to date --ID consulted  Recent tx of pneumonia - low suspicion for significant pneumonia. Held Azithromycin that was prescribed in ED on 10/5.  On Vanc as above.  Monitor for respiratory symptoms.   Schizophrenia - appears stable.  Med history is pending.  Resume meds once verified.  Type 2 Diabetes - sliding scale Novolog  HTN - chronic, stable, soft BP on admission 102/54.  Med history pending.  PRN oral hydralazine for now.      DVT prophylaxis: enoxaparin (LOVENOX) injection 40 mg Start: 06/17/20 2000   Diet:  Diet Orders  (From admission, onward)    Start     Ordered   06/17/20 1905  Diet Heart Room service appropriate? Yes; Fluid consistency: Thin  Diet effective now       Question Answer Comment  Room service appropriate? Yes   Fluid consistency: Thin      06/17/20 1905            Code Status: Full Code    Subjective 06/19/20    Pt seen at bedside during lunch today.  He says he feels well.  No fever or chills, SOB, N/VD or other acute complaints.    Disposition Plan & Communication   Status is: Inpatient  Remains inpatient appropriate because:IV treatments appropriate due to intensity of illness or inability to take PO   Dispo: The patient is from: Group home              Anticipated d/c is to: Group home              Anticipated d/c date is: 1-2 days              Patient currently is not medically stable to d/c.   Family Communication: none at bedside, will attempt to call    Consults, Procedures, Significant Events   Consultants:   Infectious disease  Procedures:   None  Antimicrobials:  Anti-infectives (From admission, onward)   Start     Dose/Rate Route Frequency Ordered Stop   06/18/20 1400  ceFAZolin (ANCEF) IVPB 2g/100 mL premix  2 g 200 mL/hr over 30 Minutes Intravenous Every 8 hours 06/18/20 1200     06/18/20 0600  vancomycin (VANCOCIN) IVPB 1000 mg/200 mL premix  Status:  Discontinued        1,000 mg 200 mL/hr over 60 Minutes Intravenous Every 12 hours 06/17/20 1925 06/18/20 1141   06/17/20 1730  vancomycin (VANCOREADY) IVPB 1750 mg/350 mL        1,750 mg 175 mL/hr over 120 Minutes Intravenous  Once 06/17/20 1726 06/17/20 2032        Objective   Vitals:   06/19/20 0018 06/19/20 0511 06/19/20 1223 06/19/20 1244  BP: 129/85 138/75 121/70 121/69  Pulse: 91 66 88 72  Resp: 18 18 16 16   Temp: 97.6 F (36.4 C) 98.4 F (36.9 C) 98.2 F (36.8 C) 98.5 F (36.9 C)  TempSrc: Oral Oral Oral Oral  SpO2: 100% 98% 98% 97%  Weight:      Height:         Intake/Output Summary (Last 24 hours) at 06/19/2020 1603 Last data filed at 06/19/2020 1300 Gross per 24 hour  Intake 580 ml  Output 1500 ml  Net -920 ml   Filed Weights   06/17/20 1410  Weight: 72.6 kg    Physical Exam:  General exam: awake, alert, no acute distress Respiratory system: CTAB, normal respiratory effort, exam limited by patient talking Cardiovascular system: normal S1/S2, RRR, no pedal edema.   Central nervous system: A&O x3. no gross focal neurologic deficits, normal speech Psychiatry: normal mood, congruent affect  Labs   Data Reviewed: I have personally reviewed following labs and imaging studies  CBC: Recent Labs  Lab 06/14/20 1650 06/17/20 1412 06/18/20 0459 06/19/20 0516  WBC 15.5* 9.4 7.4 12.1*  NEUTROABS  --  6.1  --   --   HGB 15.0 14.5 12.4* 12.6*  HCT 42.4 42.3 36.2* 37.1*  MCV 81.7 83.4 85.2 83.7  PLT 267 219 173 171   Basic Metabolic Panel: Recent Labs  Lab 06/14/20 1650 06/17/20 1412 06/18/20 0459 06/19/20 0516  NA 131* 129* 135 131*  K 4.8 3.9 4.1 3.9  CL 93* 95* 103 98  CO2 25 23 23 25   GLUCOSE 193* 230* 220* 160*  BUN 53* 38* 27* 21  CREATININE 1.35* 1.24 1.02 0.87  CALCIUM 9.4 8.9 8.6* 8.8*  MG  --   --   --  2.1   GFR: Estimated Creatinine Clearance: 91.6 mL/min (by C-G formula based on SCr of 0.87 mg/dL). Liver Function Tests: Recent Labs  Lab 06/17/20 1412  AST 17  ALT 13  ALKPHOS 81  BILITOT 0.5  PROT 6.9  ALBUMIN 3.7   No results for input(s): LIPASE, AMYLASE in the last 168 hours. No results for input(s): AMMONIA in the last 168 hours. Coagulation Profile: Recent Labs  Lab 06/17/20 1412  INR 0.9   Cardiac Enzymes: No results for input(s): CKTOTAL, CKMB, CKMBINDEX, TROPONINI in the last 168 hours. BNP (last 3 results) No results for input(s): PROBNP in the last 8760 hours. HbA1C: Recent Labs    06/17/20 1415  HGBA1C 8.0*   CBG: Recent Labs  Lab 06/18/20 1143 06/18/20 1659 06/18/20 2030  06/19/20 0759 06/19/20 1225  GLUCAP 141* 137* 221* 187* 211*   Lipid Profile: No results for input(s): CHOL, HDL, LDLCALC, TRIG, CHOLHDL, LDLDIRECT in the last 72 hours. Thyroid Function Tests: No results for input(s): TSH, T4TOTAL, FREET4, T3FREE, THYROIDAB in the last 72 hours. Anemia Panel: No results for input(s): VITAMINB12, FOLATE,  FERRITIN, TIBC, IRON, RETICCTPCT in the last 72 hours. Sepsis Labs: Recent Labs  Lab 06/15/20 0403 06/17/20 1412 06/17/20 1612 06/17/20 1907 06/18/20 0459 06/19/20 0516  PROCALCITON  --   --   --   --  1.63 0.61  LATICACIDVEN 1.9 1.9 2.3* 1.2  --   --     Recent Results (from the past 240 hour(s))  Blood Culture (routine x 2)     Status: Abnormal   Collection Time: 06/15/20  4:03 AM   Specimen: BLOOD  Result Value Ref Range Status   Specimen Description   Final    BLOOD LEFT ANTECUBITAL Performed at [facility], [address], [place], [place] [address]    Special Requests   Final    BOTTLES DRAWN AEROBIC AND ANAEROBIC Blood Culture adequate volume Performed at [facility], [address] [place], [place] [address]    Culture  Setup Time   Final    AEROBIC BOTTLE ONLY GRAM POSITIVE COCCI CRITICAL RESULT CALLED TO, READ BACK BY AND VERIFIED WITH: JASON ROBBINS 06/15/20 AT 2234 BY ACR Performed at [facility], [address] [place], [place] [address]    Culture STAPHYLOCOCCUS CAPITIS (A)  Final   Report Status 06/18/2020 FINAL  Final   Organism ID, Bacteria STAPHYLOCOCCUS CAPITIS  Final      Susceptibility   Staphylococcus capitis - MIC*    CIPROFLOXACIN <=0.5 SENSITIVE Sensitive     ERYTHROMYCIN <=0.25 SENSITIVE Sensitive     GENTAMICIN <=0.5 SENSITIVE Sensitive     OXACILLIN <=0.25 SENSITIVE Sensitive     TETRACYCLINE <=1 SENSITIVE Sensitive     VANCOMYCIN <=0.5 SENSITIVE Sensitive     TRIMETH/SULFA <=10 SENSITIVE Sensitive     CLINDAMYCIN <=0.25 SENSITIVE Sensitive     RIFAMPIN <=0.5 SENSITIVE  Sensitive     Inducible Clindamycin NEGATIVE Sensitive     * STAPHYLOCOCCUS CAPITIS  Blood Culture (routine x 2)     Status: Abnormal   Collection Time: 06/15/20  4:03 AM   Specimen: BLOOD  Result Value Ref Range Status   Specimen Description   Final    BLOOD RIGHT ANTECUBITAL Performed at [facility], [address] [place], [place] [address]    Special Requests   Final    BOTTLES DRAWN AEROBIC AND ANAEROBIC Blood Culture results may not be optimal due to an excessive volume of blood received in culture bottles Performed at [facility], [address] [place], [place] [address]    Culture  Setup Time   Final    GRAM POSITIVE COCCI ANAEROBIC BOTTLE ONLY CRITICAL VALUE NOTED.  VALUE IS CONSISTENT WITH PREVIOUSLY REPORTED AND CALLED VALUE.    Culture (A)  Final    STAPHYLOCOCCUS CAPITIS SUSCEPTIBILITIES PERFORMED ON PREVIOUS CULTURE WITHIN THE LAST 5 DAYS. Performed at [facility], [address], [place], [place] [address]    Report Status 06/18/2020 FINAL  Final  Respiratory Panel by RT PCR (Flu A&B, Covid) - Nasopharyngeal Swab     Status: None   Collection Time: 06/15/20  4:03 AM   Specimen: Nasopharyngeal Swab  Result Value Ref Range Status   SARS Coronavirus 2 by RT PCR NEGATIVE NEGATIVE Final    Comment: (NOTE) SARS-CoV-2 target nucleic acids are NOT DETECTED.  The SARS-CoV-2 RNA is generally detectable in upper respiratoy specimens during the acute phase of infection. The lowest concentration of SARS-CoV-2 viral copies this assay can detect is 131 copies/mL. A negative result does not preclude  SARS-Cov-2 infection and should not be used as the sole basis for treatment or other patient management decisions. A negative result may occur with  improper specimen collection/handling, submission of specimen other than nasopharyngeal swab, presence of viral mutation(s) within the areas targeted by this assay, and inadequate number of viral  copies (<131 copies/mL). A negative result must be combined with clinical observations, patient history, and epidemiological information. The expected result is Negative.  Fact Sheet for Patients:  https://www.moore.com/  Fact Sheet for Healthcare Providers:  https://www.young.biz/  This test is no t yet approved or cleared by the [place] FDA and  has been authorized for detection and/or diagnosis of SARS-CoV-2 by FDA under an Emergency Use Authorization (EUA). This EUA will remain  in effect (meaning this test can be used) for the duration of the COVID-19 declaration under Section 564(b)(1) of the Act, 21 U.S.C. section 360bbb-3(b)(1), unless the authorization is terminated or revoked sooner.     Influenza A by PCR NEGATIVE NEGATIVE Final   Influenza B by PCR NEGATIVE NEGATIVE Final    Comment: (NOTE) The Xpert Xpress SARS-CoV-2/FLU/RSV assay is intended as an aid in  the diagnosis of influenza from Nasopharyngeal swab specimens and  should not be used as a sole basis for treatment. Nasal washings and  aspirates are unacceptable for Xpert Xpress SARS-CoV-2/FLU/RSV  testing.  Fact Sheet for Patients: https://www.moore.com/  Fact Sheet for Healthcare Providers: https://www.young.biz/  This test is not yet approved or cleared by the [place] FDA and  has been authorized for detection and/or diagnosis of SARS-CoV-2 by  FDA under an Emergency Use Authorization (EUA). This EUA will remain  in effect (meaning this test can be used) for the duration of the  Covid-19 declaration under Section 564(b)(1) of the Act, 21  U.S.C. section 360bbb-3(b)(1), unless the authorization is  terminated or revoked. Performed at [facility], [address] [place], [place] [address]   Blood Culture ID Panel (Reflexed)     Status: Abnormal   Collection Time: 06/15/20  4:03 AM  Result Value Ref  Range Status   Enterococcus faecalis NOT DETECTED NOT DETECTED Final   Enterococcus Faecium NOT DETECTED NOT DETECTED Final   Listeria monocytogenes NOT DETECTED NOT DETECTED Final   Staphylococcus species DETECTED (A) NOT DETECTED Final    Comment: CRITICAL RESULT CALLED TO, READ BACK BY AND VERIFIED WITH: JASON ROBBINS 06/15/20 AT 2234 BY ACR    Staphylococcus aureus (BCID) NOT DETECTED NOT DETECTED Final   Staphylococcus epidermidis NOT DETECTED NOT DETECTED Final   Staphylococcus lugdunensis NOT DETECTED NOT DETECTED Final   Streptococcus species NOT DETECTED NOT DETECTED Final   Streptococcus agalactiae NOT DETECTED NOT DETECTED Final   Streptococcus pneumoniae NOT DETECTED NOT DETECTED Final   Streptococcus pyogenes NOT DETECTED NOT DETECTED Final   A.calcoaceticus-baumannii NOT DETECTED NOT DETECTED Final   Bacteroides fragilis NOT DETECTED NOT DETECTED Final   Enterobacterales NOT DETECTED NOT DETECTED Final   Enterobacter cloacae complex NOT DETECTED NOT DETECTED Final   Escherichia coli NOT DETECTED NOT DETECTED Final   Klebsiella aerogenes NOT DETECTED NOT DETECTED Final   Klebsiella oxytoca NOT DETECTED NOT DETECTED Final   Klebsiella pneumoniae NOT DETECTED NOT DETECTED Final   Proteus species NOT DETECTED NOT DETECTED Final   Salmonella species NOT DETECTED NOT DETECTED Final   Serratia marcescens NOT DETECTED NOT DETECTED Final   Haemophilus influenzae NOT DETECTED NOT DETECTED Final   Neisseria meningitidis NOT DETECTED NOT DETECTED Final   Pseudomonas aeruginosa  NOT DETECTED NOT DETECTED Final   Stenotrophomonas maltophilia NOT DETECTED NOT DETECTED Final   Candida albicans NOT DETECTED NOT DETECTED Final   Candida auris NOT DETECTED NOT DETECTED Final   Candida glabrata NOT DETECTED NOT DETECTED Final   Candida krusei NOT DETECTED NOT DETECTED Final   Candida parapsilosis NOT DETECTED NOT DETECTED Final   Candida tropicalis NOT DETECTED NOT DETECTED Final    Cryptococcus neoformans/gattii NOT DETECTED NOT DETECTED Final    Comment: Performed at [facility], [address] [place], [place] [address]  Culture, blood (Routine x 2)     Status: None (Preliminary result)   Collection Time: 06/17/20  2:17 PM   Specimen: BLOOD  Result Value Ref Range Status   Specimen Description BLOOD RIGHT ASSIST CONTROL  Final   Special Requests   Final    BOTTLES DRAWN AEROBIC AND ANAEROBIC Blood Culture results may not be optimal due to an excessive volume of blood received in culture bottles   Culture   Final    NO GROWTH < 24 HOURS Performed at [facility], [address] [place], [place] [address]    Report Status PENDING  Incomplete  Culture, blood (Routine x 2)     Status: None (Preliminary result)   Collection Time: 06/17/20  3:12 PM   Specimen: BLOOD  Result Value Ref Range Status   Specimen Description BLOOD LEFT ANTECUBITAL  Final   Special Requests   Final    BOTTLES DRAWN AEROBIC AND ANAEROBIC Blood Culture adequate volume   Culture   Final    NO GROWTH < 12 HOURS Performed at [facility], [address] [place], [place] [address]    Report Status PENDING  Incomplete  Respiratory Panel by RT PCR (Flu A&B, Covid) - Nasopharyngeal Swab     Status: None   Collection Time: 06/17/20  7:07 PM   Specimen: Nasopharyngeal Swab  Result Value Ref Range Status   SARS Coronavirus 2 by RT PCR NEGATIVE NEGATIVE Final    Comment: (NOTE) SARS-CoV-2 target nucleic acids are NOT DETECTED.  The SARS-CoV-2 RNA is generally detectable in upper respiratoy specimens during the acute phase of infection. The lowest concentration of SARS-CoV-2 viral copies this assay can detect is 131 copies/mL. A negative result does not preclude SARS-Cov-2 infection and should not be used as the sole basis for treatment or other patient management decisions. A negative result may occur with  improper specimen collection/handling, submission of  specimen other than nasopharyngeal swab, presence of viral mutation(s) within the areas targeted by this assay, and inadequate number of viral copies (<131 copies/mL). A negative result must be combined with clinical observations, patient history, and epidemiological information. The expected result is Negative.  Fact Sheet for Patients:  https://www.moore.com/  Fact Sheet for Healthcare Providers:  https://www.young.biz/  This test is no t yet approved or cleared by the [place] FDA and  has been authorized for detection and/or diagnosis of SARS-CoV-2 by FDA under an Emergency Use Authorization (EUA). This EUA will remain  in effect (meaning this test can be used) for the duration of the COVID-19 declaration under Section 564(b)(1) of the Act, 21 U.S.C. section 360bbb-3(b)(1), unless the authorization is terminated or revoked sooner.     Influenza A by PCR NEGATIVE NEGATIVE Final   Influenza B by PCR NEGATIVE NEGATIVE Final    Comment: (NOTE) The Xpert Xpress SARS-CoV-2/FLU/RSV assay is intended as an aid in  the diagnosis of influenza from Nasopharyngeal swab specimens  and  should not be used as a sole basis for treatment. Nasal washings and  aspirates are unacceptable for Xpert Xpress SARS-CoV-2/FLU/RSV  testing.  Fact Sheet for Patients: https://www.moore.com/https://www.fda.gov/media/142436/download  Fact Sheet for Healthcare Providers: https://www.young.biz/https://www.fda.gov/media/142435/download  This test is not yet approved or cleared by the [place] FDA and  has been authorized for detection and/or diagnosis of SARS-CoV-2 by  FDA under an Emergency Use Authorization (EUA). This EUA will remain  in effect (meaning this test can be used) for the duration of the  Covid-19 declaration under Section 564(b)(1) of the Act, 21  U.S.C. section 360bbb-3(b)(1), unless the authorization is  terminated or revoked. Performed at [facility], [address] [place], [place] [id number]       Imaging Studies   DG Chest 2 View  Result Date: 06/17/2020 CLINICAL DATA:  Staph bacteremia. EXAM: CHEST - 2 VIEW COMPARISON:  06/14/2020 FINDINGS: Unchanged atelectasis is again noted in the posterior right lower lung zone. There is a trace right-sided pleural effusion. No pneumothorax. Aortic calcifications are noted. There is no acute osseous abnormality. IMPRESSION: No significant short interval change. Electronically Signed   By: Katherine Mantlehristopher  Green M.D.   On: 06/17/2020 19:28     Medications   Scheduled Meds:  aspirin  81 mg Oral Daily   benazepril  20 mg Oral Daily   benztropine  1 mg Oral BID   buPROPion  300 mg Oral Daily   clonazePAM  1 mg Oral TID   colesevelam  1,875 mg Oral BID   divalproex  500 mg Oral BID   memantine  28 mg Oral Daily   And   donepezil  10 mg Oral QHS   enoxaparin (LOVENOX) injection  40 mg Subcutaneous Q24H   fluPHENAZine  5 mg Oral BID   hydrochlorothiazide  12.5 mg Oral Daily   insulin aspart  0-5 Units Subcutaneous QHS   insulin aspart  0-9 Units Subcutaneous TID WC   insulin glargine  15 Units Subcutaneous QHS   linaclotide  145 mcg Oral Daily   linagliptin  5 mg Oral Daily   umeclidinium bromide  1 puff Inhalation Daily   And   mometasone-formoterol  2 puff Inhalation BID   pantoprazole  40 mg Oral Daily   pioglitazone  45 mg Oral Daily   polyethylene glycol  17 g Oral Daily   pravastatin  40 mg Oral QHS   QUEtiapine  600 mg Oral QHS   tiotropium  18 mcg Inhalation Daily   traZODone  200 mg Oral QHS   Vitamin D (Ergocalciferol)  50,000 Units Oral Q7 days   Continuous Infusions:   ceFAZolin (ANCEF) IV 2 g (06/19/20 0548)       LOS: 2 days    Time spent: 20 minutes    Pennie BanterKelly A Altair Stanko, DO [facility]  06/19/2020, 4:03 PM    If 7PM-7AM, please contact night-coverage. How to contact the [facility] Attending or Consulting provider 7A - 7P or covering provider  during after hours 7P -7A, for this patient?    1. Check the care team in [facility] and look for a) attending/consulting [facility] provider listed and b) the [facility] team listed 2. Log into [email or web address] and use [place]'s universal password to access. If you do not have the password, please contact the hospital operator. 3. Locate the [facility] provider you are looking for under [facility] and page to a number that you can be directly reached. 4. If you still have difficulty  reaching the provider, please page the [facility] (Director on Call) for the Hospitalists listed on amion for assistance.

## 2020-06-19 NOTE — TOC Initial Note (Signed)
Transition of Care [facility]) - Initial/Assessment Note    Patient Details  Name: Johnny [name] MRN: [id number] Date of Birth: August 12, 1956  [facility]) CM/SW Contact:    Eilleen Kempf, LCSW Phone Number: 06/19/2020, 3:00 PM  Clinical   Patient is here from [facility]. Patient receives medication at the group home and they provide needed transportation. Patient has a primary care doctor, Rexene Agent.   A        Patient Goals and CMS Choice        Expected Discharge Plan and Services                                                Prior Living Arrangements/Services                       Activities of Daily Living Home Assistive Devices/Equipment: None ADL Screening (condition at time of admission) Patient's cognitive ability adequate to safely complete daily activities?: Yes Is the patient deaf or have difficulty hearing?: No Does the patient have difficulty seeing, even when wearing glasses/contacts?: No Does the patient have difficulty concentrating, remembering, or making decisions?: No Patient able to express need for assistance with ADLs?: Yes Does the patient have difficulty dressing or bathing?: No Independently performs ADLs?: Yes (appropriate for developmental age) Does the patient have difficulty walking or climbing stairs?: No Weakness of Legs: None Weakness of Arms/Hands: None  Permission Sought/Granted                  Emotional Assessment              Admission diagnosis:  Bacteremia [R78.81] Coag negative Staphylococcus bacteremia [R78.81, B95.7] Patient Active Problem List   Diagnosis Date Noted  . Bacteremia 06/17/2020  . Acute renal failure (ARF) (HCC) 07/05/2015  . SINUS TACHYCARDIA 06/10/2008  . HYPOTENSION, ORTHOSTATIC 06/10/2008  . Type 2 diabetes mellitus with hyperlipidemia (HCC) 05/19/2008  . HYPOGLYCEMIA 04/27/2008  . HYPERLIPIDEMIA 01/22/2008  . Schizophrenia  (HCC) 01/22/2008  . ANXIETY 01/22/2008  . DEPRESSION 01/22/2008  . Essential hypertension 01/22/2008  . MYOCARDIAL INFARCTION, HX OF 01/22/2008  . BRONCHITIS, CHRONIC 01/22/2008  . GERD 01/22/2008   PCP:  Franciso Bend, NP Pharmacy:  No Pharmacies Listed    Social Determinants of Health (SDOH) Interventions    Readmission Risk Interventions Readmission Risk Prevention Plan 06/19/2020  PCP or Specialist Appt within 3-5 Days Complete  HRI or Home Care Consult Complete  Palliative Care Screening Not Applicable  Medication Review (RN Care Manager) Complete  Some recent data might be hidden

## 2020-06-20 LAB — BASIC METABOLIC PANEL
Anion gap: 10 (ref 5–15)
BUN: 20 mg/dL (ref 8–23)
CO2: 25 mmol/L (ref 22–32)
Calcium: 8.6 mg/dL — ABNORMAL LOW (ref 8.9–10.3)
Chloride: 99 mmol/L (ref 98–111)
Creatinine, Ser: 0.97 mg/dL (ref 0.61–1.24)
GFR, Estimated: >60 mL/min (ref >60)
Glucose, Bld: 330 mg/dL — ABNORMAL HIGH (ref 70–99)
Potassium: 3.9 mmol/L (ref 3.5–5.1)
Sodium: 134 mmol/L — ABNORMAL LOW (ref 135–145)

## 2020-06-20 LAB — GLUCOSE, CAPILLARY
Glucose-Capillary: 319 mg/dL — ABNORMAL HIGH (ref 70–99)
Glucose-Capillary: 378 mg/dL — ABNORMAL HIGH (ref 70–99)
Glucose-Capillary: 233 mg/dL — ABNORMAL HIGH (ref 70–99)
Glucose-Capillary: 360 mg/dL — ABNORMAL HIGH (ref 70–99)
Glucose-Capillary: 190 mg/dL — ABNORMAL HIGH (ref 70–99)

## 2020-06-20 LAB — CBC WITH DIFFERENTIAL/PLATELET
Abs Immature Granulocytes: 0.04 10*3/uL (ref 0.00–0.07)
Basophils Absolute: 0 10*3/uL (ref 0.0–0.1)
Basophils Relative: 1 %
Eosinophils Absolute: 0.2 10*3/uL (ref 0.0–0.5)
Eosinophils Relative: 2 %
HCT: 38.1 % — ABNORMAL LOW (ref 39.0–52.0)
Hemoglobin: 12.8 g/dL — ABNORMAL LOW (ref 13.0–17.0)
Immature Granulocytes: 1 %
Lymphocytes Relative: 27 %
Lymphs Abs: 2.2 10*3/uL (ref 0.7–4.0)
MCH: 28.4 pg (ref 26.0–34.0)
MCHC: 33.6 g/dL (ref 30.0–36.0)
MCV: 84.7 fL (ref 80.0–100.0)
Monocytes Absolute: 0.9 10*3/uL (ref 0.1–1.0)
Monocytes Relative: 11 %
Neutro Abs: 4.8 10*3/uL (ref 1.7–7.7)
Neutrophils Relative %: 58 %
Platelets: 179 10*3/uL (ref 150–400)
RBC: 4.5 MIL/uL (ref 4.22–5.81)
RDW: 16.1 % — ABNORMAL HIGH (ref 11.5–15.5)
WBC: 8.1 10*3/uL (ref 4.0–10.5)
nRBC: 0 % (ref 0.0–0.2)

## 2020-06-20 MED ORDER — LORAZEPAM 2 MG/ML IJ SOLN
INTRAMUSCULAR | Status: AC
  Filled 2020-06-20: qty 1

## 2020-06-20 MED ORDER — LORAZEPAM 2 MG/ML IJ SOLN
2.0000 mg | Freq: Once | INTRAMUSCULAR | Status: AC
  Administered 2020-06-20: 2 mg via INTRAVENOUS

## 2020-06-20 MED ORDER — LORAZEPAM 2 MG/ML IJ SOLN: 1.0000 mg | Freq: Once | INTRAMUSCULAR | Status: DC

## 2020-06-20 MED ORDER — INSULIN GLARGINE 100 UNIT/ML ~~LOC~~ SOLN
10.0000 [IU] | Freq: Once | SUBCUTANEOUS | Status: DC
  Filled 2020-06-20: qty 0.1

## 2020-06-20 NOTE — Progress Notes (Signed)
Patient has not allowed staff to get vital signs today, pulled his PIV out, and not allowing staff to place another one.  Patient has needed redirection multiple times this shift.  Dr. Denton Lank notified.

## 2020-06-20 NOTE — Plan of Care (Signed)
Continuing with plan of care. 

## 2020-06-20 NOTE — Progress Notes (Signed)
PROGRESS NOTE    Johnny [name]   ZOX:[id number]  DOB: 08/05/1956  PCP: Franciso Bend, NP    DOA: 06/17/2020 LOS: 3   Brief Narrative   Johnny [name] is a 64 y.o. male with medical history significant for schizophrenia, hypertension, hyperlipidemia, diabetes who presented to the ED on 10/7 due to bacteremia, after blood cultures drawn during ED visit on 10/5.  He was seen that day for chest discomfort and cough, diagnosed with pneumonia, treated with IV antibiotics and discharged to his group home with oral antibiotics.  Blood cultures subsequently positive for Staph capitis.  Unknown source of this infection, but noted to have very poor dentition.   Started on IV Vancomycin in the ED and admitted to hospitalist service with ID consulted for further evaluation and management.      Assessment & Plan   Principal Problem:   Bacteremia Active Problems:   Type 2 diabetes mellitus with hyperlipidemia (HCC)   Schizophrenia (HCC)   Essential hypertension   Staph capitis bacteremia - present on admission, blood cultures from ED visit 10/5 growing staph capitis.  Possible source being poor denition, now known hardware or implants, no relevant skin findings.  No prosthetic device per chart review. No findings on exam suggestive of endocarditis. --Initially started on IV Vancomycin >> Cefazolin on 10/8 --Repeat blood cultures pending, neg to date --ID consulted --2D echo pending to evaluate for endocarditis   Recent tx of pneumonia - low suspicion for significant pneumonia. Held Azithromycin that was prescribed in ED on 10/5.  On Vanc as above.  Monitor for respiratory symptoms.   Schizophrenia - appears stable.  Med history is pending.   Resumed on home medications - Cogentin, Klonopin, Depakote, fluphenazine, Namenda, Aricept, Seroquel, Trazodone. Patient has been extremely active, talkative, wandering around, taking things on the unit including snack and fruit from  comfort care cart down the hall, smoking in his bathroom.  Little to no effect after 2 mg IV Ativan this morning. --May require sitter  Type 2 Diabetes - sliding scale Novolog.  Lantus 15 units daily.  Extra 10 units Lantus this AM  HTN - chronic, stable, soft BP on admission 102/54.  Med history pending.  PRN oral hydralazine for now.      DVT prophylaxis: enoxaparin (LOVENOX) injection 40 mg Start: 06/17/20 2000   Diet:  Diet Orders (From admission, onward)    Start     Ordered   06/17/20 1905  Diet Heart Room service appropriate? Yes; Fluid consistency: Thin  Diet effective now       Question Answer Comment  Room service appropriate? Yes   Fluid consistency: Thin      06/17/20 1905            Code Status: Full Code    Subjective 06/20/20    Pt seen at bedside with nurse this AM.  He was caught smoking in his bathroom yesterday, moved closer to nurses station.  Today he has been extremely active - wandering around, went in supply closet and took things, taking snacks from comfort care cart down the hall.  He reportedly was up much of the night.  He denies any pain, SOB, cough, F/C or other complaints.     Disposition Plan & Communication   Status is: Inpatient  Remains inpatient appropriate because:IV treatments appropriate due to intensity of illness or inability to take PO. Echo pending, continuing with IV antibiotics for bacteremia.   Dispo: The patient is from: Group  home              Anticipated d/c is to: Group home              Anticipated d/c date is: 1-2 days              Patient currently is not medically stable to d/c.   Family Communication: none at bedside, will attempt to call    Consults, Procedures, Significant Events   Consultants:   Infectious disease  Procedures:   None  Antimicrobials:  Anti-infectives (From admission, onward)   Start     Dose/Rate Route Frequency Ordered Stop   06/19/20 1613  ceFAZolin (ANCEF) IVPB 2g/100 mL  premix        2 g 200 mL/hr over 30 Minutes Intravenous Every 8 hours 06/19/20 1613     06/18/20 1400  ceFAZolin (ANCEF) IVPB 2g/100 mL premix  Status:  Discontinued        2 g 200 mL/hr over 30 Minutes Intravenous Every 8 hours 06/18/20 1200 06/19/20 1613   06/18/20 0600  vancomycin (VANCOCIN) IVPB 1000 mg/200 mL premix  Status:  Discontinued        1,000 mg 200 mL/hr over 60 Minutes Intravenous Every 12 hours 06/17/20 1925 06/18/20 1141   06/17/20 1730  vancomycin (VANCOREADY) IVPB 1750 mg/350 mL        1,750 mg 175 mL/hr over 120 Minutes Intravenous  Once 06/17/20 1726 06/17/20 2032        Objective   Vitals:   06/19/20 2019 06/19/20 2355 06/20/20 0413 06/20/20 0815  BP: 116/61 127/73 117/73   Pulse: 92 (!) 104 92   Resp: 18 16 17 16   Temp: 98.2 F (36.8 C) 97.8 F (36.6 C) 98.7 F (37.1 C)   TempSrc: Oral Oral Oral   SpO2: 99% 100% 98%   Weight:      Height:        Intake/Output Summary (Last 24 hours) at 06/20/2020 1418 Last data filed at 06/20/2020 0900 Gross per 24 hour  Intake 1799.76 ml  Output 0 ml  Net 1799.76 ml   Filed Weights   06/17/20 1410  Weight: 72.6 kg    Physical Exam:  General exam: awake, alert, no acute distress Respiratory system: CTAB, normal respiratory effort, no wheezes or rhonchi Cardiovascular system: normal S1/S2, RRR Central nervous system: A&O x3. no gross focal neurologic deficits, normal speech Psychiatry: normal mood, congruent affect, nonsensical speech content at times, no obvious hallucinations  Labs   Data Reviewed: I have personally reviewed following labs and imaging studies  CBC: Recent Labs  Lab 06/14/20 1650 06/17/20 1412 06/18/20 0459 06/19/20 0516 06/20/20 0514  WBC 15.5* 9.4 7.4 12.1* 8.1  NEUTROABS  --  6.1  --   --  4.8  HGB 15.0 14.5 12.4* 12.6* 12.8*  HCT 42.4 42.3 36.2* 37.1* 38.1*  MCV 81.7 83.4 85.2 83.7 84.7  PLT 267 219 173 171 179   Basic Metabolic Panel: Recent Labs  Lab 06/14/20 1650  06/17/20 1412 06/18/20 0459 06/19/20 0516 06/20/20 0514  NA 131* 129* 135 131* 134*  K 4.8 3.9 4.1 3.9 3.9  CL 93* 95* 103 98 99  CO2 25 23 23 25 25   GLUCOSE 193* 230* 220* 160* 330*  BUN 53* 38* 27* 21 20  CREATININE 1.35* 1.24 1.02 0.87 0.97  CALCIUM 9.4 8.9 8.6* 8.8* 8.6*  MG  --   --   --  2.1  --    GFR:  Estimated Creatinine Clearance: 82.1 mL/min (by C-G formula based on SCr of 0.97 mg/dL). Liver Function Tests: Recent Labs  Lab 06/17/20 1412  AST 17  ALT 13  ALKPHOS 81  BILITOT 0.5  PROT 6.9  ALBUMIN 3.7   No results for input(s): LIPASE, AMYLASE in the last 168 hours. No results for input(s): AMMONIA in the last 168 hours. Coagulation Profile: Recent Labs  Lab 06/17/20 1412  INR 0.9   Cardiac Enzymes: No results for input(s): CKTOTAL, CKMB, CKMBINDEX, TROPONINI in the last 168 hours. BNP (last 3 results) No results for input(s): PROBNP in the last 8760 hours. HbA1C: No results for input(s): HGBA1C in the last 72 hours. CBG: Recent Labs  Lab 06/19/20 1701 06/19/20 2011 06/20/20 0515 06/20/20 0759 06/20/20 1132  GLUCAP 133* 165* 319* 378* 233*   Lipid Profile: No results for input(s): CHOL, HDL, LDLCALC, TRIG, CHOLHDL, LDLDIRECT in the last 72 hours. Thyroid Function Tests: No results for input(s): TSH, T4TOTAL, FREET4, T3FREE, THYROIDAB in the last 72 hours. Anemia Panel: No results for input(s): VITAMINB12, FOLATE, FERRITIN, TIBC, IRON, RETICCTPCT in the last 72 hours. Sepsis Labs: Recent Labs  Lab 06/15/20 0403 06/17/20 1412 06/17/20 1612 06/17/20 1907 06/18/20 0459 06/19/20 0516  PROCALCITON  --   --   --   --  1.63 0.61  LATICACIDVEN 1.9 1.9 2.3* 1.2  --   --     Recent Results (from the past 240 hour(s))  Blood Culture (routine x 2)     Status: Abnormal   Collection Time: 06/15/20  4:03 AM   Specimen: BLOOD  Result Value Ref Range Status   Specimen Description   Final    BLOOD LEFT ANTECUBITAL Performed at [facility], [address], [place], [place] [address]    Special Requests   Final    BOTTLES DRAWN AEROBIC AND ANAEROBIC Blood Culture adequate volume Performed at [facility], [address] [place], [place] [address]    Culture  Setup Time   Final    AEROBIC BOTTLE ONLY GRAM POSITIVE COCCI CRITICAL RESULT CALLED TO, READ BACK BY AND VERIFIED WITH: JASON ROBBINS 06/15/20 AT 2234 BY ACR Performed at [facility], [address], [place], [place] [address]    Culture STAPHYLOCOCCUS CAPITIS (A)  Final   Report Status 06/18/2020 FINAL  Final   Organism ID, Bacteria STAPHYLOCOCCUS CAPITIS  Final      Susceptibility   Staphylococcus capitis - MIC*    CIPROFLOXACIN <=0.5 SENSITIVE Sensitive     ERYTHROMYCIN <=0.25 SENSITIVE Sensitive     GENTAMICIN <=0.5 SENSITIVE Sensitive     OXACILLIN <=0.25 SENSITIVE Sensitive     TETRACYCLINE <=1 SENSITIVE Sensitive     VANCOMYCIN <=0.5 SENSITIVE Sensitive     TRIMETH/SULFA <=10 SENSITIVE Sensitive     CLINDAMYCIN <=0.25 SENSITIVE Sensitive     RIFAMPIN <=0.5 SENSITIVE Sensitive     Inducible Clindamycin NEGATIVE Sensitive     * STAPHYLOCOCCUS CAPITIS  Blood Culture (routine x 2)     Status: Abnormal   Collection Time: 06/15/20  4:03 AM   Specimen: BLOOD  Result Value Ref Range Status   Specimen Description   Final    BLOOD RIGHT ANTECUBITAL Performed at [facility], [address] [place], [place] [address]    Special Requests   Final    BOTTLES DRAWN AEROBIC AND ANAEROBIC Blood Culture results may not be optimal due to an excessive volume of blood received in culture bottles Performed at  [facility], [address], [place], [place] [address]    Culture  Setup Time   Final    GRAM POSITIVE COCCI ANAEROBIC BOTTLE ONLY CRITICAL VALUE NOTED.  VALUE IS CONSISTENT WITH PREVIOUSLY REPORTED AND CALLED VALUE.    Culture (A)  Final    STAPHYLOCOCCUS CAPITIS SUSCEPTIBILITIES PERFORMED ON PREVIOUS CULTURE  WITHIN THE LAST 5 DAYS. Performed at [facility], [address] [place], [place] [address]    Report Status 06/18/2020 FINAL  Final  Respiratory Panel by RT PCR (Flu A&B, Covid) - Nasopharyngeal Swab     Status: None   Collection Time: 06/15/20  4:03 AM   Specimen: Nasopharyngeal Swab  Result Value Ref Range Status   SARS Coronavirus 2 by RT PCR NEGATIVE NEGATIVE Final    Comment: (NOTE) SARS-CoV-2 target nucleic acids are NOT DETECTED.  The SARS-CoV-2 RNA is generally detectable in upper respiratoy specimens during the acute phase of infection. The lowest concentration of SARS-CoV-2 viral copies this assay can detect is 131 copies/mL. A negative result does not preclude SARS-Cov-2 infection and should not be used as the sole basis for treatment or other patient management decisions. A negative result may occur with  improper specimen collection/handling, submission of specimen other than nasopharyngeal swab, presence of viral mutation(s) within the areas targeted by this assay, and inadequate number of viral copies (<131 copies/mL). A negative result must be combined with clinical observations, patient history, and epidemiological information. The expected result is Negative.  Fact Sheet for Patients:  https://www.moore.com/  Fact Sheet for Healthcare Providers:  https://www.young.biz/  This test is no t yet approved or cleared by the [place] FDA and  has been authorized for detection and/or diagnosis of SARS-CoV-2 by FDA under an Emergency Use Authorization (EUA). This EUA will remain  in effect (meaning this test can be used) for the duration of the COVID-19 declaration under Section 564(b)(1) of the Act, 21 U.S.C. section 360bbb-3(b)(1), unless the authorization is terminated or revoked sooner.     Influenza A by PCR NEGATIVE NEGATIVE Final   Influenza B by PCR NEGATIVE NEGATIVE Final    Comment: (NOTE) The Xpert  Xpress SARS-CoV-2/FLU/RSV assay is intended as an aid in  the diagnosis of influenza from Nasopharyngeal swab specimens and  should not be used as a sole basis for treatment. Nasal washings and  aspirates are unacceptable for Xpert Xpress SARS-CoV-2/FLU/RSV  testing.  Fact Sheet for Patients: https://www.moore.com/  Fact Sheet for Healthcare Providers: https://www.young.biz/  This test is not yet approved or cleared by the [place] FDA and  has been authorized for detection and/or diagnosis of SARS-CoV-2 by  FDA under an Emergency Use Authorization (EUA). This EUA will remain  in effect (meaning this test can be used) for the duration of the  Covid-19 declaration under Section 564(b)(1) of the Act, 21  U.S.C. section 360bbb-3(b)(1), unless the authorization is  terminated or revoked. Performed at [facility], [address] [place], [place] [address]   Blood Culture ID Panel (Reflexed)     Status: Abnormal   Collection Time: 06/15/20  4:03 AM  Result Value Ref Range Status   Enterococcus faecalis NOT DETECTED NOT DETECTED Final   Enterococcus Faecium NOT DETECTED NOT DETECTED Final   Listeria monocytogenes NOT DETECTED NOT DETECTED Final   Staphylococcus species DETECTED (A) NOT DETECTED Final    Comment: CRITICAL RESULT CALLED TO, READ BACK BY AND VERIFIED WITH: JASON ROBBINS 06/15/20 AT 2234 BY ACR  Staphylococcus aureus (BCID) NOT DETECTED NOT DETECTED Final   Staphylococcus epidermidis NOT DETECTED NOT DETECTED Final   Staphylococcus lugdunensis NOT DETECTED NOT DETECTED Final   Streptococcus species NOT DETECTED NOT DETECTED Final   Streptococcus agalactiae NOT DETECTED NOT DETECTED Final   Streptococcus pneumoniae NOT DETECTED NOT DETECTED Final   Streptococcus pyogenes NOT DETECTED NOT DETECTED Final   A.calcoaceticus-baumannii NOT DETECTED NOT DETECTED Final   Bacteroides fragilis NOT DETECTED NOT DETECTED Final     Enterobacterales NOT DETECTED NOT DETECTED Final   Enterobacter cloacae complex NOT DETECTED NOT DETECTED Final   Escherichia coli NOT DETECTED NOT DETECTED Final   Klebsiella aerogenes NOT DETECTED NOT DETECTED Final   Klebsiella oxytoca NOT DETECTED NOT DETECTED Final   Klebsiella pneumoniae NOT DETECTED NOT DETECTED Final   Proteus species NOT DETECTED NOT DETECTED Final   Salmonella species NOT DETECTED NOT DETECTED Final   Serratia marcescens NOT DETECTED NOT DETECTED Final   Haemophilus influenzae NOT DETECTED NOT DETECTED Final   Neisseria meningitidis NOT DETECTED NOT DETECTED Final   Pseudomonas aeruginosa NOT DETECTED NOT DETECTED Final   Stenotrophomonas maltophilia NOT DETECTED NOT DETECTED Final   Candida albicans NOT DETECTED NOT DETECTED Final   Candida auris NOT DETECTED NOT DETECTED Final   Candida glabrata NOT DETECTED NOT DETECTED Final   Candida krusei NOT DETECTED NOT DETECTED Final   Candida parapsilosis NOT DETECTED NOT DETECTED Final   Candida tropicalis NOT DETECTED NOT DETECTED Final   Cryptococcus neoformans/gattii NOT DETECTED NOT DETECTED Final    Comment: Performed at [facility], [address] [place], [place] [address]  Culture, blood (Routine x 2)     Status: None (Preliminary result)   Collection Time: 06/17/20  2:17 PM   Specimen: BLOOD  Result Value Ref Range Status   Specimen Description BLOOD RIGHT ASSIST CONTROL  Final   Special Requests   Final    BOTTLES DRAWN AEROBIC AND ANAEROBIC Blood Culture results may not be optimal due to an excessive volume of blood received in culture bottles   Culture   Final    NO GROWTH 3 DAYS Performed at [facility], [address] [place], [place] [address]    Report Status PENDING  Incomplete  Culture, blood (Routine x 2)     Status: None (Preliminary result)   Collection Time: 06/17/20  3:12 PM   Specimen: BLOOD  Result Value Ref Range Status   Specimen Description BLOOD LEFT  ANTECUBITAL  Final   Special Requests   Final    BOTTLES DRAWN AEROBIC AND ANAEROBIC Blood Culture adequate volume   Culture   Final    NO GROWTH 3 DAYS Performed at [facility], [address], [place], [place] [address]    Report Status PENDING  Incomplete  Respiratory Panel by RT PCR (Flu A&B, Covid) - Nasopharyngeal Swab     Status: None   Collection Time: 06/17/20  7:07 PM   Specimen: Nasopharyngeal Swab  Result Value Ref Range Status   SARS Coronavirus 2 by RT PCR NEGATIVE NEGATIVE Final    Comment: (NOTE) SARS-CoV-2 target nucleic acids are NOT DETECTED.  The SARS-CoV-2 RNA is generally detectable in upper respiratoy specimens during the acute phase of infection. The lowest concentration of SARS-CoV-2 viral copies this assay can detect is 131 copies/mL. A negative result does not preclude SARS-Cov-2 infection and should not be used as the sole basis for treatment or other patient management decisions. A negative result may occur with  improper specimen collection/handling, submission of specimen other than nasopharyngeal swab, presence of viral mutation(s) within the areas targeted by this assay, and inadequate number of viral copies (<131 copies/mL). A negative result must be combined with clinical observations, patient history, and epidemiological information. The expected result is Negative.  Fact Sheet for Patients:  https://www.moore.com/https://www.fda.gov/media/142436/download  Fact Sheet for Healthcare Providers:  https://www.young.biz/https://www.fda.gov/media/142435/download  This test is no t yet approved or cleared by the [place] FDA and  has been authorized for detection and/or diagnosis of SARS-CoV-2 by FDA under an Emergency Use Authorization (EUA). This EUA will remain  in effect (meaning this test can be used) for the duration of the COVID-19 declaration under Section 564(b)(1) of the Act, 21 U.S.C. section 360bbb-3(b)(1), unless the authorization is terminated or revoked  sooner.     Influenza A by PCR NEGATIVE NEGATIVE Final   Influenza B by PCR NEGATIVE NEGATIVE Final    Comment: (NOTE) The Xpert Xpress SARS-CoV-2/FLU/RSV assay is intended as an aid in  the diagnosis of influenza from Nasopharyngeal swab specimens and  should not be used as a sole basis for treatment. Nasal washings and  aspirates are unacceptable for Xpert Xpress SARS-CoV-2/FLU/RSV  testing.  Fact Sheet for Patients: https://www.moore.com/https://www.fda.gov/media/142436/download  Fact Sheet for Healthcare Providers: https://www.young.biz/https://www.fda.gov/media/142435/download  This test is not yet approved or cleared by the [place] FDA and  has been authorized for detection and/or diagnosis of SARS-CoV-2 by  FDA under an Emergency Use Authorization (EUA). This EUA will remain  in effect (meaning this test can be used) for the duration of the  Covid-19 declaration under Section 564(b)(1) of the Act, 21  U.S.C. section 360bbb-3(b)(1), unless the authorization is  terminated or revoked. Performed at [facility], [address] [place], [place] [address]       Imaging Studies   No results found.   Medications   Scheduled Meds: . aspirin  81 mg Oral Daily  . benazepril  20 mg Oral Daily  . benztropine  1 mg Oral BID  . buPROPion  300 mg Oral Daily  . clonazePAM  1 mg Oral TID  . colesevelam  1,875 mg Oral BID  . divalproex  500 mg Oral BID  . memantine  28 mg Oral Daily   And  . donepezil  10 mg Oral QHS  . enoxaparin (LOVENOX) injection  40 mg Subcutaneous Q24H  . fluPHENAZine  5 mg Oral BID  . hydrochlorothiazide  12.5 mg Oral Daily  . insulin aspart  0-5 Units Subcutaneous QHS  . insulin aspart  0-9 Units Subcutaneous TID WC  . insulin glargine  10 Units Subcutaneous Once  . insulin glargine  15 Units Subcutaneous QHS  . linaclotide  145 mcg Oral Daily  . linagliptin  5 mg Oral Daily  . LORazepam      . umeclidinium bromide  1 puff Inhalation Daily   And  .  mometasone-formoterol  2 puff Inhalation BID  . pantoprazole  40 mg Oral Daily  . pioglitazone  45 mg Oral Daily  . polyethylene glycol  17 g Oral Daily  . pravastatin  40 mg Oral QHS  . QUEtiapine  600 mg Oral QHS  . tiotropium  18 mcg Inhalation Daily  . traZODone  200 mg Oral QHS  . Vitamin D (Ergocalciferol)  50,000 Units Oral Q7 days   Continuous Infusions: .  ceFAZolin (ANCEF) IV 2 g (06/20/20 0835)       LOS: 3 days    Time  spent: 25 minutes with > 50% spent in coordination of care and direct patient contact.    Pennie Banter, DO [facility]  06/20/2020, 2:18 PM    If 7PM-7AM, please contact night-coverage. How to contact the [facility] Attending or Consulting provider 7A - 7P or covering provider during after hours 7P -7A, for this patient?    1. Check the care team in [facility] and look for a) attending/consulting [facility] provider listed and b) the [facility] team listed 2. Log into [email or web address] and use [facility] universal password to access. If you do not have the password, please contact the hospital operator. 3. Locate the [facility] provider you are looking for under [facility] and page to a number that you can be directly reached. 4. If you still have difficulty reaching the provider, please page the [facility] (Director on Call) for the Hospitalists listed on amion for assistance.

## 2020-06-20 NOTE — Progress Notes (Signed)
Pt has declined IV to be resited twice this evening and is starting to get agitated with further reinforcements. Day MD is already aware of this issue since this afternoon. Unable to administer midnight dose of IV Ancef.

## 2020-06-21 ENCOUNTER — Inpatient Hospital Stay (HOSPITAL_COMMUNITY)
Admit: 2020-06-21 | Discharge: 2020-06-21 | Disposition: A | Discharge status: Home / Self Care | Payer: Medicare Other | Attending: Internal Medicine | Admitting: Internal Medicine

## 2020-06-21 DIAGNOSIS — R7881 Bacteremia: Secondary | ICD-10-CM

## 2020-06-21 LAB — GLUCOSE, CAPILLARY
Glucose-Capillary: 191 mg/dL — ABNORMAL HIGH (ref 70–99)
Glucose-Capillary: 344 mg/dL — ABNORMAL HIGH (ref 70–99)

## 2020-06-21 LAB — ECHOCARDIOGRAM COMPLETE
AR max vel: 1.83 cm2
AV Area VTI: 1.98 cm2
AV Area mean vel: 1.81 cm2
AV Mean grad: 5 mmHg
AV Peak grad: 9.1 mmHg
Ao pk vel: 1.51 m/s
Area-P 1/2: 3.91 cm2
Height: 72 in
S' Lateral: 2.98 cm
Weight: 2560.02 oz

## 2020-06-21 MED ORDER — AMOXICILLIN-POT CLAVULANATE 875-125 MG PO TABS
1.0000 | ORAL_TABLET | Freq: Two times a day (BID) | ORAL | Status: DC
  Administered 2020-06-21: 1 via ORAL
  Filled 2020-06-21: qty 1

## 2020-06-21 MED ORDER — CEPHALEXIN 500 MG PO CAPS: 500.0000 mg | ORAL_CAPSULE | Freq: Four times a day (QID) | ORAL | 0 refills | Status: AC

## 2020-06-21 MED ORDER — CEPHALEXIN 500 MG PO CAPS: 500.0000 mg | ORAL_CAPSULE | Freq: Four times a day (QID) | ORAL | Status: DC

## 2020-06-21 MED ORDER — AMOXICILLIN-POT CLAVULANATE 875-125 MG PO TABS: 1.0000 | ORAL_TABLET | Freq: Two times a day (BID) | ORAL | 0 refills | Status: DC

## 2020-06-21 NOTE — Plan of Care (Signed)

## 2020-06-21 NOTE — Progress Notes (Signed)
Inpatient Diabetes Program Recommendations  AACE/ADA: New Consensus Statement on Inpatient Glycemic Control (2015)  Target Ranges:  Prepandial:   less than 140 mg/dL      Peak postprandial:   less than 180 mg/dL (1-2 hours)      Critically ill patients:  140 - 180 mg/dL   Lab Results  Component Value Date   GLUCAP 344 (H) 06/21/2020   HGBA1C 8.0 (H) 06/17/2020    Review of Glycemic Control  Admit with Bacteremia  History: DM, Schizophrenia  Home DM Meds: Lantus 30 units QHS + Invokamet 50/1000  mg BID + Januvia 100 mg Daily + Actos 45 mg Daily  Current Orders: Lantus 10 units + Actos 45 mg qd + Tradjenta 5 mg + Novolog 0-9 units ac/hs  Inpatient Diabetes Program Recommendations:   -Post meal CBGs continue to be elevated. Consider Novolog 4 units tid meal coverage if eats 50% although Actos is now ordered while in the hospital as well.  Thank you, Billy Fischer. Amin Fornwalt, RN, MSN, CDE  Diabetes Coordinator Inpatient Glycemic Control Team Team Pager [phone] (8am-5pm) 06/21/2020 12:07 PM

## 2020-06-21 NOTE — Treatment Plan (Signed)
Patient off unit with caregiver in stable condition.

## 2020-06-21 NOTE — Progress Notes (Signed)
Date of Admission:  06/17/2020     Lab Results Recent Labs    06/19/20 0516 06/20/20 0514  WBC 12.1* 8.1  HGB 12.6* 12.8*  HCT 37.1* 38.1*  NA 131* 134*  K 3.9 3.9  CL 98 99  CO2 25 25  BUN 21 20  CREATININE 0.87 0.97   Liver Panel No results for input(s): PROT, ALBUMIN, AST, ALT, ALKPHOS, BILITOT, BILIDIR, IBILI in the last 72 hours. Sedimentation Rate No results for input(s): ESRSEDRATE in the last 72 hours. C-Reactive Protein No results for input(s): CRP in the last 72 hours.  Microbiology:  Studies/Results: ECHOCARDIOGRAM COMPLETE  Result Date: 06/21/2020    ECHOCARDIOGRAM REPORT   Patient Name:   GRADEN HOSHINO Date of Exam: 06/21/2020 Medical Rec #:  [id number]        Height:       72.0 in Accession #:    [id number]       Weight:       160.0 lb Date of Birth:  05-26-1956       BSA:          1.938 m Patient Age:    64 years         BP:           122/65 mmHg Patient Gender: M                HR:           78 bpm. Exam Location:  [facility] Procedure: 2D Echo, Color Doppler and Cardiac Doppler Indications:     R78.81 Bacteremia  History:         Patient has no prior history of Echocardiogram examinations.                  Signs/Symptoms:Altered mental status; Risk                  Factors:Hypertension, Diabetes and Dyslipidemia.  Sonographer:     Humphrey Rolls RDCS (AE) Referring Phys:  [id number] Tresa Endo A GRIFFITH Diagnosing Phys: Lorine Bears MD  Sonographer Comments: Suboptimal parasternal window, suboptimal apical window and suboptimal subcostal window. Image acquisition challenging due to uncooperative patient, Image acquisition challenging due to patient behavioral factors. and Image acquisition challenging due to respiratory motion. IMPRESSIONS  1. Left ventricular ejection fraction, by estimation, is 60 to 65%. The left ventricle has normal function. The left ventricle has no regional wall motion abnormalities. Left ventricular diastolic parameters were normal.  2. Right ventricular  systolic function is normal. The right ventricular size is normal. Tricuspid regurgitation signal is inadequate for assessing PA pressure.  3. The mitral valve is normal in structure. No evidence of mitral valve regurgitation. No evidence of mitral stenosis.  4. The aortic valve is normal in structure. Aortic valve regurgitation is not visualized. No aortic stenosis is present.  5. The inferior vena cava is normal in size with greater than 50% respiratory variability, suggesting right atrial pressure of 3 mmHg.  6. Suboptimal study due to patient's lack of cooperation. Conclusion(s)/Recommendation(s): No evidence of valvular vegetations on this transthoracic echocardiogram. FINDINGS  Left Ventricle: Left ventricular ejection fraction, by estimation, is 60 to 65%. The left ventricle has normal function. The left ventricle has no regional wall motion abnormalities. The left ventricular internal cavity size was normal in size. There is  no left ventricular hypertrophy. Left ventricular diastolic parameters were normal. Right Ventricle: The right ventricular size is normal. No increase in right ventricular wall thickness. Right  ventricular systolic function is normal. Tricuspid regurgitation signal is inadequate for assessing PA pressure. Left Atrium: Left atrial size was normal in size. Right Atrium: Right atrial size was normal in size. Pericardium: There is no evidence of pericardial effusion. Mitral Valve: The mitral valve is normal in structure. No evidence of mitral valve regurgitation. No evidence of mitral valve stenosis. MV peak gradient, 1.6 mmHg. The mean mitral valve gradient is 1.0 mmHg. Tricuspid Valve: The tricuspid valve is normal in structure. Tricuspid valve regurgitation is not demonstrated. No evidence of tricuspid stenosis. Aortic Valve: The aortic valve is normal in structure. Aortic valve regurgitation is not visualized. No aortic stenosis is present. Aortic valve mean gradient measures 5.0 mmHg.  Aortic valve peak gradient measures 9.1 mmHg. Aortic valve area, by VTI measures 1.98 cm. Pulmonic Valve: The pulmonic valve was normal in structure. Pulmonic valve regurgitation is not visualized. No evidence of pulmonic stenosis. Aorta: The aortic root is normal in size and structure. Venous: The inferior vena cava is normal in size with greater than 50% respiratory variability, suggesting right atrial pressure of 3 mmHg. IAS/Shunts: No atrial level shunt detected by color flow Doppler.  LEFT VENTRICLE PLAX 2D LVIDd:         4.67 cm LVIDs:         2.98 cm LV PW:         0.82 cm LV IVS:        0.69 cm LVOT diam:     2.10 cm LV SV:         43 LV SV Index:   22 LVOT Area:     3.46 cm  LEFT ATRIUM         Index LA diam:    3.20 cm 1.65 cm/m  AORTIC VALVE AV Area (Vmax):    1.83 cm AV Area (Vmean):   1.81 cm AV Area (VTI):     1.98 cm AV Vmax:           151.00 cm/s AV Vmean:          109.000 cm/s AV VTI:            0.217 m AV Peak Grad:      9.1 mmHg AV Mean Grad:      5.0 mmHg LVOT Vmax:         79.80 cm/s LVOT Vmean:        57.000 cm/s LVOT VTI:          0.124 m LVOT/AV VTI ratio: 0.57  AORTA Ao Root diam: 3.00 cm MITRAL VALVE MV Area (PHT): 3.91 cm    SHUNTS MV Peak grad:  1.6 mmHg    Systemic VTI:  0.12 m MV Mean grad:  1.0 mmHg    Systemic Diam: 2.10 cm MV Vmax:       0.62 m/s MV Vmean:      45.0 cm/s MV Decel Time: 194 msec MV E velocity: 45.10 cm/s MV A velocity: 50.40 cm/s MV E/A ratio:  0.89 Lorine Bears MD Electronically signed by Lorine Bears MD Signature Date/Time: 06/21/2020/1:02:26 PM    Final      Assessment/Plan: ?Staph capitis bacteremia on both aerobic bottles from the left cubital and anaerobic bottle from the right cubital culture. Not sure of the significance-? Pathogen ? 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 Infection VS atelectasis Pt is  asymptomatic and looking well  is on cefazolin- can be discharged on 7-10 days of keflex ? Schizophrenia_pt is not cooperative, walking all over and going to other patient's room  Discussed with Dr.Griffith

## 2020-06-21 NOTE — [place] FL2 (Signed)
[place] MEDICAID FL2 LEVEL OF CARE SCREENING TOOL     IDENTIFICATION  Patient Name: Johnny [name] Birthdate: 1955-11-23 Sex: male Admission Date (Current Location): 06/17/2020  [place] and [place] Number:  Chiropodist and Address:  [facility], [address] [place], [place] [address]      Provider Number: [id number]  Attending Physician Name and Address:  Pennie Banter, DO  Relative Name and Phone Number:       Current Level of Care: Hospital Recommended Level of Care: Other (Comment) ([facility]) Prior Approval Number:    Date Approved/Denied:   PASRR Number:    Discharge Plan: Other (Comment) ([facility])    Current Diagnoses: Patient Active Problem List   Diagnosis Date Noted  . Bacteremia 06/17/2020  . Acute renal failure (ARF) (HCC) 07/05/2015  . SINUS TACHYCARDIA 06/10/2008  . HYPOTENSION, ORTHOSTATIC 06/10/2008  . Type 2 diabetes mellitus with hyperlipidemia (HCC) 05/19/2008  . HYPOGLYCEMIA 04/27/2008  . HYPERLIPIDEMIA 01/22/2008  . Schizophrenia (HCC) 01/22/2008  . ANXIETY 01/22/2008  . DEPRESSION 01/22/2008  . Essential hypertension 01/22/2008  . MYOCARDIAL INFARCTION, HX OF 01/22/2008  . BRONCHITIS, CHRONIC 01/22/2008  . GERD 01/22/2008    Orientation RESPIRATION BLADDER Height & Weight     Self, Time, Place  Normal Continent Weight: 160 lb (72.6 kg) Height:  6' (182.9 cm)  BEHAVIORAL SYMPTOMS/MOOD NEUROLOGICAL BOWEL NUTRITION STATUS   (None)  (None) Continent Diet (Heart healthy)  AMBULATORY STATUS COMMUNICATION OF NEEDS Skin   Independent Verbally Other (Comment) (Cracking.)                       Personal Care Assistance Level of Assistance              Functional Limitations Info  Sight, Hearing, Speech Sight Info: Adequate Hearing Info: Adequate Speech Info: Adequate    SPECIAL CARE FACTORS FREQUENCY                       Contractures  Contractures Info: Not present    Additional Factors Info  Code Status, Allergies, Psychotropic Code Status Info: Full code Allergies Info: Fish Allergy, Shellfish Allergy Psychotropic Info: Schizophrenia         Current Medications (06/21/2020):  This is the current hospital active medication list Current Facility-Administered Medications  Medication Dose Route Frequency Provider Last Rate Last Admin  . albuterol (PROVENTIL) (2.5 MG/3ML) 0.083% nebulizer solution 2.5 mg  2.5 mg Nebulization Q4H PRN Mila Merry A, RPH      . amoxicillin-clavulanate (AUGMENTIN) 875-125 MG per tablet 1 tablet  1 tablet Oral Q12H Esaw Grandchild A, DO   1 tablet at 06/21/20 0827  . aspirin chewable tablet 81 mg  81 mg Oral Daily Esaw Grandchild A, DO   81 mg at 06/21/20 0819  . benazepril (LOTENSIN) tablet 20 mg  20 mg Oral Daily Esaw Grandchild A, DO   20 mg at 06/21/20 0815  . benztropine (COGENTIN) tablet 1 mg  1 mg Oral BID Esaw Grandchild A, DO   1 mg at 06/21/20 0815  . buPROPion (WELLBUTRIN XL) 24 hr tablet 300 mg  300 mg Oral Daily Esaw Grandchild A, DO   300 mg at 06/21/20 0818  . clonazePAM (KLONOPIN) tablet 1 mg  1 mg Oral TID Esaw Grandchild A, DO   1 mg at 06/21/20 0819  . colesevelam [facility]) tablet 1,875 mg  1,875 mg Oral BID  Esaw Grandchild A, DO   1,875 mg at 06/21/20 0816  . divalproex (DEPAKOTE ER) 24 hr tablet 500 mg  500 mg Oral BID Esaw Grandchild A, DO   500 mg at 06/21/20 0817  . memantine (NAMENDA XR) 24 hr capsule 28 mg  28 mg Oral Daily Esaw Grandchild A, DO   28 mg at 06/20/20 2105   And  . donepezil (ARICEPT) tablet 10 mg  10 mg Oral QHS Esaw Grandchild A, DO   10 mg at 06/20/20 2106  . enoxaparin (LOVENOX) injection 40 mg  40 mg Subcutaneous Q24H Tu, Ching T, DO   40 mg at 06/20/20 2106  . fluPHENAZine (PROLIXIN) tablet 5 mg  5 mg Oral BID Esaw Grandchild A, DO   5 mg at 06/21/20 3846  . hydrALAZINE (APRESOLINE) tablet 25 mg  25 mg Oral Q8H PRN Esaw Grandchild A, DO      .  hydrochlorothiazide (MICROZIDE) capsule 12.5 mg  12.5 mg Oral Daily Esaw Grandchild A, DO   12.5 mg at 06/21/20 0818  . insulin aspart (novoLOG) injection 0-5 Units  0-5 Units Subcutaneous QHS Tu, Ching T, DO   2 Units at 06/18/20 2137  . insulin aspart (novoLOG) injection 0-9 Units  0-9 Units Subcutaneous TID WC Tu, Ching T, DO   7 Units at 06/21/20 1155  . insulin glargine (LANTUS) injection 10 Units  10 Units Subcutaneous Once Esaw Grandchild A, DO      . insulin glargine (LANTUS) injection 15 Units  15 Units Subcutaneous QHS Esaw Grandchild A, DO   15 Units at 06/20/20 2111  . linaclotide (LINZESS) capsule 145 mcg  145 mcg Oral Daily Esaw Grandchild A, DO   145 mcg at 06/21/20 0815  . linagliptin (TRADJENTA) tablet 5 mg  5 mg Oral Daily Esaw Grandchild A, DO   5 mg at 06/21/20 0815  . mometasone-formoterol (DULERA) 100-5 MCG/ACT inhaler 2 puff  2 puff Inhalation BID Esaw Grandchild A, DO   2 puff at 06/21/20 0820  . pantoprazole (PROTONIX) EC tablet 40 mg  40 mg Oral Daily Esaw Grandchild A, DO   40 mg at 06/21/20 0819  . pioglitazone (ACTOS) tablet 45 mg  45 mg Oral Daily Esaw Grandchild A, DO   45 mg at 06/21/20 0817  . polyethylene glycol (MIRALAX / GLYCOLAX) packet 17 g  17 g Oral Daily Esaw Grandchild A, DO   17 g at 06/21/20 0818  . pravastatin (PRAVACHOL) tablet 40 mg  40 mg Oral QHS Esaw Grandchild A, DO   40 mg at 06/20/20 2104  . QUEtiapine (SEROQUEL) tablet 600 mg  600 mg Oral QHS Esaw Grandchild A, DO   600 mg at 06/20/20 2106  . tiotropium (SPIRIVA) inhalation capsule ([facility] use ONLY) 18 mcg  18 mcg Inhalation Daily Esaw Grandchild A, DO   18 mcg at 06/21/20 6599  . traZODone (DESYREL) tablet 200 mg  200 mg Oral QHS Esaw Grandchild A, DO   200 mg at 06/20/20 2104  . Vitamin D (Ergocalciferol) (DRISDOL) capsule 50,000 Units  50,000 Units Oral Q7 days Pennie Banter, DO   50,000 Units at 06/19/20 3570     Discharge Medications: STOP taking these medications   azithromycin 250 MG  tablet Commonly known as: Zithromax     TAKE these medications   albuterol 108 (90 Base) MCG/ACT inhaler Commonly known as: VENTOLIN HFA Inhale 2 puffs into the lungs every 4 (four) hours as needed for wheezing or shortness of breath.  ammonium lactate 12 % cream Commonly known as: AMLACTIN Apply 1 g topically daily.   amoxicillin-clavulanate 875-125 MG tablet Commonly known as: AUGMENTIN Take 1 tablet by mouth every 12 (twelve) hours for 10 days.   aspirin 81 MG chewable tablet Chew 81 mg by mouth daily.   benazepril 20 MG tablet Commonly known as: LOTENSIN Take 20 mg by mouth daily.   benztropine 1 MG tablet Commonly known as: COGENTIN Take 1 mg by mouth 2 (two) times daily.   Breztri Aerosphere 160-9-4.8 MCG/ACT Aero Generic drug: Budeson-Glycopyrrol-Formoterol Inhale 2 puffs into the lungs 2 (two) times daily.   buPROPion 300 MG 24 hr tablet Commonly known as: WELLBUTRIN XL Take 300 mg by mouth daily.   clonazePAM 1 MG tablet Commonly known as: KLONOPIN Take 1 mg by mouth 3 (three) times daily.   colesevelam 625 MG tablet Commonly known as: WELCHOL Take 1,875 mg by mouth 2 (two) times daily.   divalproex 500 MG 24 hr tablet Commonly known as: DEPAKOTE ER Take 500 mg by mouth 2 (two) times daily.   fluPHENAZine 5 MG tablet Commonly known as: PROLIXIN Take 5 mg by mouth 2 (two) times daily. Pt also takes daily as needed for psychosis/agitation.   hydrochlorothiazide 12.5 MG capsule Commonly known as: MICROZIDE Take 12.5 mg by mouth daily.   insulin glargine 100 UNIT/ML injection Commonly known as: LANTUS Inject 30 Units into the skin at bedtime.   Invega Trinza 819 MG/2.625ML injection Generic drug: Paliperidone Palmitate ER Inject 819 mg into the muscle every 30 (thirty) days.   Invokamet 50-1000 MG Tabs Generic drug: Canagliflozin-metFORMIN HCl Take 1 tablet by mouth 2 (two) times daily with a meal.   Linzess 145 MCG Caps  capsule Generic drug: linaclotide Take 145 mcg by mouth daily.   Namzaric 28-10 MG Cp24 Generic drug: Memantine HCl-Donepezil HCl Take 1 capsule by mouth at bedtime.   omeprazole 40 MG capsule Commonly known as: PRILOSEC Take 40 mg by mouth daily.   pioglitazone 45 MG tablet Commonly known as: ACTOS Take 45 mg by mouth daily.   polyethylene glycol 17 g packet Commonly known as: MIRALAX / GLYCOLAX Take 17 g by mouth daily.   pravastatin 40 MG tablet Commonly known as: PRAVACHOL Take 40 mg by mouth at bedtime.   QUEtiapine 400 MG tablet Commonly known as: SEROQUEL Take 600 mg by mouth at bedtime.   sitaGLIPtin 100 MG tablet Commonly known as: JANUVIA Take 100 mg by mouth daily.   Synjardy 12.01-999 MG Tabs Generic drug: Empagliflozin-metFORMIN HCl Take 1 tablet by mouth 2 (two) times daily.   tiotropium 18 MCG inhalation capsule Commonly known as: SPIRIVA Place 18 mcg into inhaler and inhale daily.   traZODone 100 MG tablet Commonly known as: DESYREL Take 200 mg by mouth at bedtime.   Vitamin D (Ergocalciferol) 1.25 MG (50000 UNIT) Caps capsule Commonly known as: DRISDOL Take 50,000 Units by mouth every 7 (seven) days.     Relevant Imaging Results:  Relevant Lab Results:   Additional Information SS#: [id number]  Margarito Liner, LCSW

## 2020-06-21 NOTE — TOC Transition Note (Addendum)
Transition of Care [facility]) - CM/SW Discharge Note   Patient Details  Name: Johnny [name] MRN: [id number] Date of Birth: 16-Jul-1956  Transition of Care [facility]) CM/SW Contact:  Margarito Liner, LCSW Phone Number: 06/21/2020, 1:44 PM   Clinical Narrative: Patient has orders to discharge back to his group home today. Left voicemail for guardian Johnny [name]. CSW called and spoke with her supervisor Johnny [name] and she said she would update her. Group Home staff member, Johnny [name] will pick him up at 3:00. FL2 and discharge summary are in his discharge packet for [name]. No further concerns. CSW signing off.  2:45 pm: Received call back from guardian [name]. Provided update. CSW signing off.  Final next level of care: Group Home Barriers to Discharge: Barriers Resolved   Patient Goals and CMS Choice        Discharge Placement                Patient to be transferred to facility by: Johnny [name] (group home staff member) will pick him up Name of family member notified: Johnny [name] Patient and family notified of of transfer: 06/21/20  Discharge Plan and Services                                     Social Determinants of Health (SDOH) Interventions     Readmission Risk Interventions Readmission Risk Prevention Plan 06/19/2020  PCP or Specialist Appt within 3-5 Days Complete  HRI or Home Care Consult Complete  Palliative Care Screening Not Applicable  Medication Review (RN Care Manager) Complete  Some recent data might be hidden

## 2020-06-21 NOTE — Discharge Summary (Signed)
Physician Discharge Summary  Johnny [name] ZOX:[id number] DOB: 28-Jun-1956 DOA: 06/17/2020  PCP: Franciso Bend, NP  Admit date: 06/17/2020 Discharge date: 06/21/2020  Admitted From: group home Disposition:  Group home  Recommendations for Outpatient Follow-up:  1. Follow up with PCP in 1-2 weeks 2. Please obtain BMP/CBC in one week 3. Follow up on pending echocardiogram report   Home Health: No  Equipment/Devices: None   Discharge Condition: Stable  CODE STATUS: Full  Diet recommendation: Heart Healthy    Discharge Diagnoses: Principal Problem:   Bacteremia Active Problems:   Type 2 diabetes mellitus with hyperlipidemia (HCC)   Schizophrenia (HCC)   Essential hypertension    Summary of HPI and Hospital Course:  Johnny [name] is a 64 y.o. male with medical history significant for schizophrenia, hypertension, hyperlipidemia, diabetes who presented to the ED on 10/7 due to bacteremia, after blood cultures drawn during ED visit on 10/5.  He was seen that day for chest discomfort and cough, diagnosed with pneumonia, treated with IV antibiotics and discharged to his group home with oral antibiotics.  Blood cultures subsequently positive for Staph capitis.  Unknown source of this infection, but noted to have very poor dentition.   Started on IV Vancomycin in the ED and admitted to hospitalist service with ID consulted for further evaluation and management.      Staph capitis bacteremia - present on admission, blood cultures from ED visit 10/5 growing staph capitis.  Possible source being poor denition but not a typical organism for oral, no hardware or implants, no relevant skin findings.  No prosthetic device per chart review. No findings on exam suggestive of endocarditis. 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  Pt is without any respiratory or infectious symptoms over duration of admission.  If infection, will be covered by Augmentin.  Recent tx of pneumonia - low suspicion for significant pneumonia at this time.  Held Azithromycin that was prescribed in ED on 10/5.  Treated with antibiotics as above.  Schizophrenia - appears stable overall, but patient quite restless, wandering around, asks inappropriate questions of staff.  Pulled out IVs, refused new one.  Has taken his PO medications without issue. Resumed on home medications - Cogentin, Klonopin, Depakote, fluphenazine, Namenda, Aricept, Seroquel, Trazodone.  Type 2 Diabetes - covered with sliding scale Novolog and Lantus 15 units daily during admission.  Extra 10 units Lantus needed on AM of 10/10 - nursing report he was eating snack from comfort care cart down the hall the day before, hence very elevated blood glucose.    HTN - chronic, stable, soft BP on admission 102/54. Resume home medications.      Discharge Instructions   Discharge Instructions    Call MD for:  extreme fatigue   Complete by: As directed    Call MD for:  persistant dizziness or light-headedness   Complete by: As directed    Call MD for:  persistant nausea and vomiting   Complete by: As directed    Call MD for:  severe uncontrolled pain  Complete by: As directed    Call MD for:  temperature >100.4   Complete by: As directed    Diet - low sodium heart healthy   Complete by: As directed    Discharge instructions   Complete by: As directed    Take antibiotic (Augmentin) twice daily for 10 days to clear up your infection. Take the antibiotic until  completely gone even if you feel better.   Increase activity slowly   Complete by: As directed      Allergies as of 06/21/2020      Reactions   Fish Allergy Rash   Shellfish Allergy Rash      Medication List    STOP taking these medications   azithromycin 250 MG tablet Commonly known as: Zithromax     TAKE these medications   albuterol 108 (90 Base) MCG/ACT inhaler Commonly known as: VENTOLIN HFA Inhale 2 puffs into the lungs every 4 (four) hours as needed for wheezing or shortness of breath.   ammonium lactate 12 % cream Commonly known as: AMLACTIN Apply 1 g topically daily.   amoxicillin-clavulanate 875-125 MG tablet Commonly known as: AUGMENTIN Take 1 tablet by mouth every 12 (twelve) hours for 10 days.   aspirin 81 MG chewable tablet Chew 81 mg by mouth daily.   benazepril 20 MG tablet Commonly known as: LOTENSIN Take 20 mg by mouth daily.   benztropine 1 MG tablet Commonly known as: COGENTIN Take 1 mg by mouth 2 (two) times daily.   Breztri Aerosphere 160-9-4.8 MCG/ACT Aero Generic drug: Budeson-Glycopyrrol-Formoterol Inhale 2 puffs into the lungs 2 (two) times daily.   buPROPion 300 MG 24 hr tablet Commonly known as: WELLBUTRIN XL Take 300 mg by mouth daily.   clonazePAM 1 MG tablet Commonly known as: KLONOPIN Take 1 mg by mouth 3 (three) times daily.   colesevelam 625 MG tablet Commonly known as: WELCHOL Take 1,875 mg by mouth 2 (two) times daily.   divalproex 500 MG 24 hr tablet Commonly known as: DEPAKOTE ER Take 500 mg by mouth 2 (two) times daily.   fluPHENAZine 5 MG tablet Commonly known as: PROLIXIN Take 5 mg by mouth 2 (two) times daily. Pt also takes daily as needed for psychosis/agitation.   hydrochlorothiazide 12.5 MG capsule Commonly known as: MICROZIDE Take 12.5 mg by mouth daily.   insulin glargine 100 UNIT/ML injection Commonly known as: LANTUS Inject 30 Units into the skin at bedtime.   Invega Trinza 819 MG/2.625ML  injection Generic drug: Paliperidone Palmitate ER Inject 819 mg into the muscle every 30 (thirty) days.   Invokamet 50-1000 MG Tabs Generic drug: Canagliflozin-metFORMIN HCl Take 1 tablet by mouth 2 (two) times daily with a meal.   Linzess 145 MCG Caps capsule Generic drug: linaclotide Take 145 mcg by mouth daily.   Namzaric 28-10 MG Cp24 Generic drug: Memantine HCl-Donepezil HCl Take 1 capsule by mouth at bedtime.   omeprazole 40 MG capsule Commonly known as: PRILOSEC Take 40 mg by mouth daily.   pioglitazone 45 MG tablet Commonly known as: ACTOS Take 45 mg by mouth daily.   polyethylene glycol 17 g packet Commonly known as: MIRALAX / GLYCOLAX Take 17 g by mouth daily.   pravastatin 40 MG tablet Commonly known as: PRAVACHOL Take 40 mg by mouth at bedtime.   QUEtiapine 400 MG tablet Commonly known as: SEROQUEL Take 600 mg by mouth at bedtime.   sitaGLIPtin 100 MG tablet Commonly known as: JANUVIA Take 100 mg by mouth daily.  Synjardy 12.01-999 MG Tabs Generic drug: Empagliflozin-metFORMIN HCl Take 1 tablet by mouth 2 (two) times daily.   tiotropium 18 MCG inhalation capsule Commonly known as: SPIRIVA Place 18 mcg into inhaler and inhale daily.   traZODone 100 MG tablet Commonly known as: DESYREL Take 200 mg by mouth at bedtime.   Vitamin D (Ergocalciferol) 1.25 MG (50000 UNIT) Caps capsule Commonly known as: DRISDOL Take 50,000 Units by mouth every 7 (seven) days.       Allergies  Allergen Reactions  . Fish Allergy Rash  . Shellfish Allergy Rash    Consultations:  Infectious Disease    Procedures/Studies: DG Chest 2 View  Result Date: 06/17/2020 CLINICAL DATA:  Staph bacteremia. EXAM: CHEST - 2 VIEW COMPARISON:  06/14/2020 FINDINGS: Unchanged atelectasis is again noted in the posterior right lower lung zone. There is a trace right-sided pleural effusion. No pneumothorax. Aortic calcifications are noted. There is no acute osseous abnormality.  IMPRESSION: No significant short interval change. Electronically Signed   By: Katherine Mantle M.D.   On: 06/17/2020 19:28   DG Chest 2 View  Result Date: 06/14/2020 CLINICAL DATA:  Chest pain and shortness of breath EXAM: CHEST - 2 VIEW COMPARISON:  07/05/2015 FINDINGS: Cardiac shadow is within normal limits. Aortic calcifications are seen without aneurysmal dilatation. The lungs are well aerated bilaterally. Increased density is noted in the right lung base projecting in the right lower lobe along the posterior pleural margin. This likely represents focal infiltrate with some associated small right-sided effusion. Follow-up films are recommended. No bony abnormality is seen. IMPRESSION: Pleural based density/infiltrate in the right lower lobe posteriorly. Follow-up exam is recommended following appropriate therapy to assess for resolution. Electronically Signed   By: Alcide Clever M.D.   On: 06/14/2020 17:42   CT Angio Chest PE W/Cm &/Or Wo Cm  Result Date: 06/15/2020 CLINICAL DATA:  Pneumonia EXAM: CT ANGIOGRAPHY CHEST WITH CONTRAST TECHNIQUE: Multidetector CT imaging of the chest was performed using the standard protocol during bolus administration of intravenous contrast. Multiplanar CT image reconstructions and MIPs were obtained to evaluate the vascular anatomy. CONTRAST:  OMNIPAQUE IOHEXOL 350 MG/ML SOLN COMPARISON:  Abdominal CT 11/15/2010 FINDINGS: Cardiovascular: Normal heart size. No pericardial effusion. Limited CTA due to bolus dispersion and intermittent motion. No visible pulmonary embolism. No acute aortic finding. There is aortic atherosclerosis. Mediastinum/Nodes: Circumferential mid to lower esophageal thickening. Small volume a soft endoluminal fluid is present. Lungs/Pleura: Pleural thickening with bands of opacity in the right lung both anteriorly and posteriorly. Posteriorly there is clear swirling of interstitium associated with the opacity, especially on sagittal reformats.  Mild centrilobular emphysema. There is no edema, consolidation, effusion, or pneumothorax. Upper Abdomen: Minimal coverage shows no acute finding. Where covered the stomach is fluid distended and there is changes of cholecystectomy. Partially covered left adrenal adenoma, also seen by abdominal CT in 2012, 3 cm where covered. Musculoskeletal: No acute or aggressive finding. Symmetric gynecomastia. Review of the MIP images confirms the above findings. IMPRESSION: 1. Limited CTA due to significant motion artifact and bolus dispersion. No visible pulmonary embolism. 2. Right-sided pleuroparenchymal scarring including round atelectasis. 3. Circumferential esophageal thickening in this patient with history of gastroesophageal reflux. 4. Aortic Atherosclerosis (ICD10-I70.0) and Emphysema (ICD10-J43.9). Electronically Signed   By: Marnee Spring M.D.   On: 06/15/2020 04:42   ECHOCARDIOGRAM COMPLETE  Result Date: 06/21/2020    ECHOCARDIOGRAM REPORT   Patient Name:   Johnny [name] Date of Exam: 06/21/2020 Medical Rec #:  [id number]  Height:       72.0 in Accession #:    [id number]       Weight:       160.0 lb Date of Birth:  07/02/1956       BSA:          1.938 m Patient Age:    63 years         BP:           122/65 mmHg Patient Gender: M                HR:           78 bpm. Exam Location:  [facility] Procedure: 2D Echo, Color Doppler and Cardiac Doppler Indications:     R78.81 Bacteremia  History:         Patient has no prior history of Echocardiogram examinations.                  Signs/Symptoms:Altered mental status; Risk                  Factors:Hypertension, Diabetes and Dyslipidemia.  Sonographer:     Humphrey Rolls RDCS (AE) Referring Phys:  [id number] Tresa Endo A Zaeda Mcferran Diagnosing Phys: Lorine Bears MD  Sonographer Comments: Suboptimal parasternal window, suboptimal apical window and suboptimal subcostal window. Image acquisition challenging due to uncooperative patient, Image acquisition challenging due to patient  behavioral factors. and Image acquisition challenging due to respiratory motion. IMPRESSIONS  1. Left ventricular ejection fraction, by estimation, is 60 to 65%. The left ventricle has normal function. The left ventricle has no regional wall motion abnormalities. Left ventricular diastolic parameters were normal.  2. Right ventricular systolic function is normal. The right ventricular size is normal. Tricuspid regurgitation signal is inadequate for assessing PA pressure.  3. The mitral valve is normal in structure. No evidence of mitral valve regurgitation. No evidence of mitral stenosis.  4. The aortic valve is normal in structure. Aortic valve regurgitation is not visualized. No aortic stenosis is present.  5. The inferior vena cava is normal in size with greater than 50% respiratory variability, suggesting right atrial pressure of 3 mmHg.  6. Suboptimal study due to patient's lack of cooperation. Conclusion(s)/Recommendation(s): No evidence of valvular vegetations on this transthoracic echocardiogram. FINDINGS  Left Ventricle: Left ventricular ejection fraction, by estimation, is 60 to 65%. The left ventricle has normal function. The left ventricle has no regional wall motion abnormalities. The left ventricular internal cavity size was normal in size. There is  no left ventricular hypertrophy. Left ventricular diastolic parameters were normal. Right Ventricle: The right ventricular size is normal. No increase in right ventricular wall thickness. Right ventricular systolic function is normal. Tricuspid regurgitation signal is inadequate for assessing PA pressure. Left Atrium: Left atrial size was normal in size. Right Atrium: Right atrial size was normal in size. Pericardium: There is no evidence of pericardial effusion. Mitral Valve: The mitral valve is normal in structure. No evidence of mitral valve regurgitation. No evidence of mitral valve stenosis. MV peak gradient, 1.6 mmHg. The mean mitral valve gradient  is 1.0 mmHg. Tricuspid Valve: The tricuspid valve is normal in structure. Tricuspid valve regurgitation is not demonstrated. No evidence of tricuspid stenosis. Aortic Valve: The aortic valve is normal in structure. Aortic valve regurgitation is not visualized. No aortic stenosis is present. Aortic valve mean gradient measures 5.0 mmHg. Aortic valve peak gradient measures 9.1 mmHg. Aortic valve area, by VTI measures 1.98 cm. Pulmonic Valve: The pulmonic valve  was normal in structure. Pulmonic valve regurgitation is not visualized. No evidence of pulmonic stenosis. Aorta: The aortic root is normal in size and structure. Venous: The inferior vena cava is normal in size with greater than 50% respiratory variability, suggesting right atrial pressure of 3 mmHg. IAS/Shunts: No atrial level shunt detected by color flow Doppler.  LEFT VENTRICLE PLAX 2D LVIDd:         4.67 cm LVIDs:         2.98 cm LV PW:         0.82 cm LV IVS:        0.69 cm LVOT diam:     2.10 cm LV SV:         43 LV SV Index:   22 LVOT Area:     3.46 cm  LEFT ATRIUM         Index LA diam:    3.20 cm 1.65 cm/m  AORTIC VALVE AV Area (Vmax):    1.83 cm AV Area (Vmean):   1.81 cm AV Area (VTI):     1.98 cm AV Vmax:           151.00 cm/s AV Vmean:          109.000 cm/s AV VTI:            0.217 m AV Peak Grad:      9.1 mmHg AV Mean Grad:      5.0 mmHg LVOT Vmax:         79.80 cm/s LVOT Vmean:        57.000 cm/s LVOT VTI:          0.124 m LVOT/AV VTI ratio: 0.57  AORTA Ao Root diam: 3.00 cm MITRAL VALVE MV Area (PHT): 3.91 cm    SHUNTS MV Peak grad:  1.6 mmHg    Systemic VTI:  0.12 m MV Mean grad:  1.0 mmHg    Systemic Diam: 2.10 cm MV Vmax:       0.62 m/s MV Vmean:      45.0 cm/s MV Decel Time: 194 msec MV E velocity: 45.10 cm/s MV A velocity: 50.40 cm/s MV E/A ratio:  0.89 Lorine Bears MD Electronically signed by Lorine Bears MD Signature Date/Time: 06/21/2020/1:02:26 PM    Final        Subjective: Pt seen walking on the unit today.  No acute  events reported.  Pulled IV yesterday and has refused new one.  He denies any acute complaints including pain, fever/chills, SOB, CP, N/V/D.   Discharge Exam: Vitals:   06/21/20 0446 06/21/20 0813  BP: 122/65 100/70  Pulse: 89 96  Resp: 15 16  Temp: 98.2 F (36.8 C) 98.8 F (37.1 C)  SpO2: 99% 100%   Vitals:   06/20/20 1600 06/20/20 2043 06/21/20 0446 06/21/20 0813  BP:  117/71 122/65 100/70  Pulse:  96 89 96  Resp: 18 17 15 16   Temp:  98.7 F (37.1 C) 98.2 F (36.8 C) 98.8 F (37.1 C)  TempSrc:  Oral Oral Oral  SpO2:  100% 99% 100%  Weight:      Height:        General: Pt is alert, awake, not in acute distress Cardiovascular: RRR, S1/S2 +, no rubs, no gallops Respiratory: CTA bilaterally, no wheezing, no rhonchi Abdominal: Soft, NT, ND, bowel sounds + Extremities: no edema, no cyanosis    The results of significant diagnostics from this hospitalization (including imaging, microbiology, ancillary and laboratory) are listed below for  reference.     Microbiology: Recent Results (from the past 240 hour(s))  Blood Culture (routine x 2)     Status: Abnormal   Collection Time: 06/15/20  4:03 AM   Specimen: BLOOD  Result Value Ref Range Status   Specimen Description   Final    BLOOD LEFT ANTECUBITAL Performed at [facility], [address], [place], [place] [address]    Special Requests   Final    BOTTLES DRAWN AEROBIC AND ANAEROBIC Blood Culture adequate volume Performed at [facility], [address] [place], [place] [address]    Culture  Setup Time   Final    AEROBIC BOTTLE ONLY GRAM POSITIVE COCCI CRITICAL RESULT CALLED TO, READ BACK BY AND VERIFIED WITH: JASON ROBBINS 06/15/20 AT 2234 BY ACR Performed at [facility], [address] [place], [place] [address]    Culture STAPHYLOCOCCUS CAPITIS (A)  Final   Report Status 06/18/2020 FINAL  Final   Organism ID, Bacteria STAPHYLOCOCCUS CAPITIS  Final      Susceptibility    Staphylococcus capitis - MIC*    CIPROFLOXACIN <=0.5 SENSITIVE Sensitive     ERYTHROMYCIN <=0.25 SENSITIVE Sensitive     GENTAMICIN <=0.5 SENSITIVE Sensitive     OXACILLIN <=0.25 SENSITIVE Sensitive     TETRACYCLINE <=1 SENSITIVE Sensitive     VANCOMYCIN <=0.5 SENSITIVE Sensitive     TRIMETH/SULFA <=10 SENSITIVE Sensitive     CLINDAMYCIN <=0.25 SENSITIVE Sensitive     RIFAMPIN <=0.5 SENSITIVE Sensitive     Inducible Clindamycin NEGATIVE Sensitive     * STAPHYLOCOCCUS CAPITIS  Blood Culture (routine x 2)     Status: Abnormal   Collection Time: 06/15/20  4:03 AM   Specimen: BLOOD  Result Value Ref Range Status   Specimen Description   Final    BLOOD RIGHT ANTECUBITAL Performed at [facility], [address], [place], [place] [address]    Special Requests   Final    BOTTLES DRAWN AEROBIC AND ANAEROBIC Blood Culture results may not be optimal due to an excessive volume of blood received in culture bottles Performed at [facility], [address] [place], [place] [address]    Culture  Setup Time   Final    GRAM POSITIVE COCCI ANAEROBIC BOTTLE ONLY CRITICAL VALUE NOTED.  VALUE IS CONSISTENT WITH PREVIOUSLY REPORTED AND CALLED VALUE.    Culture (A)  Final    STAPHYLOCOCCUS CAPITIS SUSCEPTIBILITIES PERFORMED ON PREVIOUS CULTURE WITHIN THE LAST 5 DAYS. Performed at [facility], [address] [place], [place] [address]    Report Status 06/18/2020 FINAL  Final  Respiratory Panel by RT PCR (Flu A&B, Covid) - Nasopharyngeal Swab     Status: None   Collection Time: 06/15/20  4:03 AM   Specimen: Nasopharyngeal Swab  Result Value Ref Range Status   SARS Coronavirus 2 by RT PCR NEGATIVE NEGATIVE Final    Comment: (NOTE) SARS-CoV-2 target nucleic acids are NOT DETECTED.  The SARS-CoV-2 RNA is generally detectable in upper respiratoy specimens during the acute phase of infection. The lowest concentration of SARS-CoV-2 viral copies this assay can detect  is 131 copies/mL. A negative result does not preclude SARS-Cov-2 infection and should not be used as the sole basis for treatment or other patient management decisions. A negative result may occur with  improper specimen collection/handling, submission of specimen other than nasopharyngeal swab, presence of viral mutation(s) within the areas targeted by this assay, and inadequate number of viral  copies (<131 copies/mL). A negative result must be combined with clinical observations, patient history, and epidemiological information. The expected result is Negative.  Fact Sheet for Patients:  https://www.moore.com/  Fact Sheet for Healthcare Providers:  https://www.young.biz/  This test is no t yet approved or cleared by the [place] FDA and  has been authorized for detection and/or diagnosis of SARS-CoV-2 by FDA under an Emergency Use Authorization (EUA). This EUA will remain  in effect (meaning this test can be used) for the duration of the COVID-19 declaration under Section 564(b)(1) of the Act, 21 U.S.C. section 360bbb-3(b)(1), unless the authorization is terminated or revoked sooner.     Influenza A by PCR NEGATIVE NEGATIVE Final   Influenza B by PCR NEGATIVE NEGATIVE Final    Comment: (NOTE) The Xpert Xpress SARS-CoV-2/FLU/RSV assay is intended as an aid in  the diagnosis of influenza from Nasopharyngeal swab specimens and  should not be used as a sole basis for treatment. Nasal washings and  aspirates are unacceptable for Xpert Xpress SARS-CoV-2/FLU/RSV  testing.  Fact Sheet for Patients: https://www.moore.com/  Fact Sheet for Healthcare Providers: https://www.young.biz/  This test is not yet approved or cleared by the [place] FDA and  has been authorized for detection and/or diagnosis of SARS-CoV-2 by  FDA under an Emergency Use Authorization (EUA). This EUA will remain  in effect  (meaning this test can be used) for the duration of the  Covid-19 declaration under Section 564(b)(1) of the Act, 21  U.S.C. section 360bbb-3(b)(1), unless the authorization is  terminated or revoked. Performed at [facility], [address] [place], [place] [address]   Blood Culture ID Panel (Reflexed)     Status: Abnormal   Collection Time: 06/15/20  4:03 AM  Result Value Ref Range Status   Enterococcus faecalis NOT DETECTED NOT DETECTED Final   Enterococcus Faecium NOT DETECTED NOT DETECTED Final   Listeria monocytogenes NOT DETECTED NOT DETECTED Final   Staphylococcus species DETECTED (A) NOT DETECTED Final    Comment: CRITICAL RESULT CALLED TO, READ BACK BY AND VERIFIED WITH: JASON ROBBINS 06/15/20 AT 2234 BY ACR    Staphylococcus aureus (BCID) NOT DETECTED NOT DETECTED Final   Staphylococcus epidermidis NOT DETECTED NOT DETECTED Final   Staphylococcus lugdunensis NOT DETECTED NOT DETECTED Final   Streptococcus species NOT DETECTED NOT DETECTED Final   Streptococcus agalactiae NOT DETECTED NOT DETECTED Final   Streptococcus pneumoniae NOT DETECTED NOT DETECTED Final   Streptococcus pyogenes NOT DETECTED NOT DETECTED Final   A.calcoaceticus-baumannii NOT DETECTED NOT DETECTED Final   Bacteroides fragilis NOT DETECTED NOT DETECTED Final   Enterobacterales NOT DETECTED NOT DETECTED Final   Enterobacter cloacae complex NOT DETECTED NOT DETECTED Final   Escherichia coli NOT DETECTED NOT DETECTED Final   Klebsiella aerogenes NOT DETECTED NOT DETECTED Final   Klebsiella oxytoca NOT DETECTED NOT DETECTED Final   Klebsiella pneumoniae NOT DETECTED NOT DETECTED Final   Proteus species NOT DETECTED NOT DETECTED Final   Salmonella species NOT DETECTED NOT DETECTED Final   Serratia marcescens NOT DETECTED NOT DETECTED Final   Haemophilus influenzae NOT DETECTED NOT DETECTED Final   Neisseria meningitidis NOT DETECTED NOT DETECTED Final   Pseudomonas aeruginosa NOT DETECTED NOT  DETECTED Final   Stenotrophomonas maltophilia NOT DETECTED NOT DETECTED Final   Candida albicans NOT DETECTED NOT DETECTED Final   Candida auris NOT DETECTED NOT DETECTED Final   Candida glabrata NOT DETECTED NOT DETECTED Final   Candida krusei NOT DETECTED NOT DETECTED Final  Candida parapsilosis NOT DETECTED NOT DETECTED Final   Candida tropicalis NOT DETECTED NOT DETECTED Final   Cryptococcus neoformans/gattii NOT DETECTED NOT DETECTED Final    Comment: Performed at [facility], [address] [place], [place] [address]  Culture, blood (Routine x 2)     Status: None (Preliminary result)   Collection Time: 06/17/20  2:17 PM   Specimen: BLOOD  Result Value Ref Range Status   Specimen Description BLOOD RIGHT ASSIST CONTROL  Final   Special Requests   Final    BOTTLES DRAWN AEROBIC AND ANAEROBIC Blood Culture results may not be optimal due to an excessive volume of blood received in culture bottles   Culture   Final    NO GROWTH 4 DAYS Performed at [facility], [address] [place], [place] [address]    Report Status PENDING  Incomplete  Culture, blood (Routine x 2)     Status: None (Preliminary result)   Collection Time: 06/17/20  3:12 PM   Specimen: BLOOD  Result Value Ref Range Status   Specimen Description BLOOD LEFT ANTECUBITAL  Final   Special Requests   Final    BOTTLES DRAWN AEROBIC AND ANAEROBIC Blood Culture adequate volume   Culture   Final    NO GROWTH 4 DAYS Performed at [facility], [address] [place], [place] [address]    Report Status PENDING  Incomplete  Respiratory Panel by RT PCR (Flu A&B, Covid) - Nasopharyngeal Swab     Status: None   Collection Time: 06/17/20  7:07 PM   Specimen: Nasopharyngeal Swab  Result Value Ref Range Status   SARS Coronavirus 2 by RT PCR NEGATIVE NEGATIVE Final    Comment: (NOTE) SARS-CoV-2 target nucleic acids are NOT DETECTED.  The SARS-CoV-2 RNA is generally detectable in upper  respiratoy specimens during the acute phase of infection. The lowest concentration of SARS-CoV-2 viral copies this assay can detect is 131 copies/mL. A negative result does not preclude SARS-Cov-2 infection and should not be used as the sole basis for treatment or other patient management decisions. A negative result may occur with  improper specimen collection/handling, submission of specimen other than nasopharyngeal swab, presence of viral mutation(s) within the areas targeted by this assay, and inadequate number of viral copies (<131 copies/mL). A negative result must be combined with clinical observations, patient history, and epidemiological information. The expected result is Negative.  Fact Sheet for Patients:  https://www.moore.com/https://www.fda.gov/media/142436/download  Fact Sheet for Healthcare Providers:  https://www.young.biz/https://www.fda.gov/media/142435/download  This test is no t yet approved or cleared by the [place] FDA and  has been authorized for detection and/or diagnosis of SARS-CoV-2 by FDA under an Emergency Use Authorization (EUA). This EUA will remain  in effect (meaning this test can be used) for the duration of the COVID-19 declaration under Section 564(b)(1) of the Act, 21 U.S.C. section 360bbb-3(b)(1), unless the authorization is terminated or revoked sooner.     Influenza A by PCR NEGATIVE NEGATIVE Final   Influenza B by PCR NEGATIVE NEGATIVE Final    Comment: (NOTE) The Xpert Xpress SARS-CoV-2/FLU/RSV assay is intended as an aid in  the diagnosis of influenza from Nasopharyngeal swab specimens and  should not be used as a sole basis for treatment. Nasal washings and  aspirates are unacceptable for Xpert Xpress SARS-CoV-2/FLU/RSV  testing.  Fact Sheet for Patients: https://www.moore.com/https://www.fda.gov/media/142436/download  Fact Sheet for Healthcare Providers: https://www.young.biz/https://www.fda.gov/media/142435/download  This test is not yet approved or cleared by the [place] FDA and  has  been  authorized for detection and/or diagnosis of SARS-CoV-2 by  FDA under an Emergency Use Authorization (EUA). This EUA will remain  in effect (meaning this test can be used) for the duration of the  Covid-19 declaration under Section 564(b)(1) of the Act, 21  U.S.C. section 360bbb-3(b)(1), unless the authorization is  terminated or revoked. Performed at [facility], [address] [place], [place] [address]      Labs: BNP (last 3 results) No results for input(s): BNP in the last 8760 hours. Basic Metabolic Panel: Recent Labs  Lab 06/14/20 1650 06/17/20 1412 06/18/20 0459 06/19/20 0516 06/20/20 0514  NA 131* 129* 135 131* 134*  K 4.8 3.9 4.1 3.9 3.9  CL 93* 95* 103 98 99  CO2 25 23 23 25 25   GLUCOSE 193* 230* 220* 160* 330*  BUN 53* 38* 27* 21 20  CREATININE 1.35* 1.24 1.02 0.87 0.97  CALCIUM 9.4 8.9 8.6* 8.8* 8.6*  MG  --   --   --  2.1  --    Liver Function Tests: Recent Labs  Lab 06/17/20 1412  AST 17  ALT 13  ALKPHOS 81  BILITOT 0.5  PROT 6.9  ALBUMIN 3.7   No results for input(s): LIPASE, AMYLASE in the last 168 hours. No results for input(s): AMMONIA in the last 168 hours. CBC: Recent Labs  Lab 06/14/20 1650 06/17/20 1412 06/18/20 0459 06/19/20 0516 06/20/20 0514  WBC 15.5* 9.4 7.4 12.1* 8.1  NEUTROABS  --  6.1  --   --  4.8  HGB 15.0 14.5 12.4* 12.6* 12.8*  HCT 42.4 42.3 36.2* 37.1* 38.1*  MCV 81.7 83.4 85.2 83.7 84.7  PLT 267 219 173 171 179   Cardiac Enzymes: No results for input(s): CKTOTAL, CKMB, CKMBINDEX, TROPONINI in the last 168 hours. BNP: Invalid input(s): POCBNP CBG: Recent Labs  Lab 06/20/20 1132 06/20/20 1717 06/20/20 2046 06/21/20 0737 06/21/20 1102  GLUCAP 233* 360* 190* 191* 344*   D-Dimer No results for input(s): DDIMER in the last 72 hours. Hgb A1c No results for input(s): HGBA1C in the last 72 hours. Lipid Profile No results for input(s): CHOL, HDL, LDLCALC, TRIG, CHOLHDL, LDLDIRECT in the last 72  hours. Thyroid function studies No results for input(s): TSH, T4TOTAL, T3FREE, THYROIDAB in the last 72 hours.  Invalid input(s): FREET3 Anemia work up No results for input(s): VITAMINB12, FOLATE, FERRITIN, TIBC, IRON, RETICCTPCT in the last 72 hours. Urinalysis    Component Value Date/Time   COLORURINE YELLOW (A) 06/17/2020 1412   APPEARANCEUR CLEAR (A) 06/17/2020 1412   LABSPEC 1.027 06/17/2020 1412   PHURINE 5.0 06/17/2020 1412   GLUCOSEU >=500 (A) 06/17/2020 1412   HGBUR NEGATIVE 06/17/2020 1412   HGBUR negative 06/10/2008 0920   BILIRUBINUR NEGATIVE 06/17/2020 1412   KETONESUR NEGATIVE 06/17/2020 1412   PROTEINUR NEGATIVE 06/17/2020 1412   UROBILINOGEN 1.0 08/04/2008 1322   NITRITE NEGATIVE 06/17/2020 1412   LEUKOCYTESUR NEGATIVE 06/17/2020 1412   Sepsis Labs Invalid input(s): PROCALCITONIN,  WBC,  LACTICIDVEN Microbiology Recent Results (from the past 240 hour(s))  Blood Culture (routine x 2)     Status: Abnormal   Collection Time: 06/15/20  4:03 AM   Specimen: BLOOD  Result Value Ref Range Status   Specimen Description   Final    BLOOD LEFT ANTECUBITAL Performed at [facility], [address] [place], [place]    Special Requests   Final    BOTTLES DRAWN AEROBIC AND ANAEROBIC Blood Culture adequate volume Performed  at [facility], [address] [place], [place] [address]    Culture  Setup Time   Final    AEROBIC BOTTLE ONLY GRAM POSITIVE COCCI CRITICAL RESULT CALLED TO, READ BACK BY AND VERIFIED WITH: JASON ROBBINS 06/15/20 AT 2234 BY ACR Performed at [facility], [address], [place], [place] [address]    Culture STAPHYLOCOCCUS CAPITIS (A)  Final   Report Status 06/18/2020 FINAL  Final   Organism ID, Bacteria STAPHYLOCOCCUS CAPITIS  Final      Susceptibility   Staphylococcus capitis - MIC*    CIPROFLOXACIN <=0.5 SENSITIVE Sensitive     ERYTHROMYCIN <=0.25 SENSITIVE Sensitive     GENTAMICIN <=0.5 SENSITIVE  Sensitive     OXACILLIN <=0.25 SENSITIVE Sensitive     TETRACYCLINE <=1 SENSITIVE Sensitive     VANCOMYCIN <=0.5 SENSITIVE Sensitive     TRIMETH/SULFA <=10 SENSITIVE Sensitive     CLINDAMYCIN <=0.25 SENSITIVE Sensitive     RIFAMPIN <=0.5 SENSITIVE Sensitive     Inducible Clindamycin NEGATIVE Sensitive     * STAPHYLOCOCCUS CAPITIS  Blood Culture (routine x 2)     Status: Abnormal   Collection Time: 06/15/20  4:03 AM   Specimen: BLOOD  Result Value Ref Range Status   Specimen Description   Final    BLOOD RIGHT ANTECUBITAL Performed at [facility], [address] [place], [place] [address]    Special Requests   Final    BOTTLES DRAWN AEROBIC AND ANAEROBIC Blood Culture results may not be optimal due to an excessive volume of blood received in culture bottles Performed at [facility], [address] [place], [place] [address]    Culture  Setup Time   Final    GRAM POSITIVE COCCI ANAEROBIC BOTTLE ONLY CRITICAL VALUE NOTED.  VALUE IS CONSISTENT WITH PREVIOUSLY REPORTED AND CALLED VALUE.    Culture (A)  Final    STAPHYLOCOCCUS CAPITIS SUSCEPTIBILITIES PERFORMED ON PREVIOUS CULTURE WITHIN THE LAST 5 DAYS. Performed at [facility], [address] [place], [place] [address]    Report Status 06/18/2020 FINAL  Final  Respiratory Panel by RT PCR (Flu A&B, Covid) - Nasopharyngeal Swab     Status: None   Collection Time: 06/15/20  4:03 AM   Specimen: Nasopharyngeal Swab  Result Value Ref Range Status   SARS Coronavirus 2 by RT PCR NEGATIVE NEGATIVE Final    Comment: (NOTE) SARS-CoV-2 target nucleic acids are NOT DETECTED.  The SARS-CoV-2 RNA is generally detectable in upper respiratoy specimens during the acute phase of infection. The lowest concentration of SARS-CoV-2 viral copies this assay can detect is 131 copies/mL. A negative result does not preclude SARS-Cov-2 infection and should not be used as the sole basis for treatment or other patient  management decisions. A negative result may occur with  improper specimen collection/handling, submission of specimen other than nasopharyngeal swab, presence of viral mutation(s) within the areas targeted by this assay, and inadequate number of viral copies (<131 copies/mL). A negative result must be combined with clinical observations, patient history, and epidemiological information. The expected result is Negative.  Fact Sheet for Patients:  https://www.moore.com/  Fact Sheet for Healthcare Providers:  https://www.young.biz/  This test is no t yet approved or cleared by the [place] FDA and  has been authorized for detection and/or diagnosis of SARS-CoV-2 by FDA under an Emergency Use Authorization (EUA). This EUA will remain  in effect (meaning this test can be used) for the duration of the  COVID-19 declaration under Section 564(b)(1) of the Act, 21 U.S.C. section 360bbb-3(b)(1), unless the authorization is terminated or revoked sooner.     Influenza A by PCR NEGATIVE NEGATIVE Final   Influenza B by PCR NEGATIVE NEGATIVE Final    Comment: (NOTE) The Xpert Xpress SARS-CoV-2/FLU/RSV assay is intended as an aid in  the diagnosis of influenza from Nasopharyngeal swab specimens and  should not be used as a sole basis for treatment. Nasal washings and  aspirates are unacceptable for Xpert Xpress SARS-CoV-2/FLU/RSV  testing.  Fact Sheet for Patients: https://www.moore.com/  Fact Sheet for Healthcare Providers: https://www.young.biz/  This test is not yet approved or cleared by the [place] FDA and  has been authorized for detection and/or diagnosis of SARS-CoV-2 by  FDA under an Emergency Use Authorization (EUA). This EUA will remain  in effect (meaning this test can be used) for the duration of the  Covid-19 declaration under Section 564(b)(1) of the Act, 21  U.S.C. section 360bbb-3(b)(1),  unless the authorization is  terminated or revoked. Performed at [facility], [address] [place], [place] [address]   Blood Culture ID Panel (Reflexed)     Status: Abnormal   Collection Time: 06/15/20  4:03 AM  Result Value Ref Range Status   Enterococcus faecalis NOT DETECTED NOT DETECTED Final   Enterococcus Faecium NOT DETECTED NOT DETECTED Final   Listeria monocytogenes NOT DETECTED NOT DETECTED Final   Staphylococcus species DETECTED (A) NOT DETECTED Final    Comment: CRITICAL RESULT CALLED TO, READ BACK BY AND VERIFIED WITH: JASON ROBBINS 06/15/20 AT 2234 BY ACR    Staphylococcus aureus (BCID) NOT DETECTED NOT DETECTED Final   Staphylococcus epidermidis NOT DETECTED NOT DETECTED Final   Staphylococcus lugdunensis NOT DETECTED NOT DETECTED Final   Streptococcus species NOT DETECTED NOT DETECTED Final   Streptococcus agalactiae NOT DETECTED NOT DETECTED Final   Streptococcus pneumoniae NOT DETECTED NOT DETECTED Final   Streptococcus pyogenes NOT DETECTED NOT DETECTED Final   A.calcoaceticus-baumannii NOT DETECTED NOT DETECTED Final   Bacteroides fragilis NOT DETECTED NOT DETECTED Final   Enterobacterales NOT DETECTED NOT DETECTED Final   Enterobacter cloacae complex NOT DETECTED NOT DETECTED Final   Escherichia coli NOT DETECTED NOT DETECTED Final   Klebsiella aerogenes NOT DETECTED NOT DETECTED Final   Klebsiella oxytoca NOT DETECTED NOT DETECTED Final   Klebsiella pneumoniae NOT DETECTED NOT DETECTED Final   Proteus species NOT DETECTED NOT DETECTED Final   Salmonella species NOT DETECTED NOT DETECTED Final   Serratia marcescens NOT DETECTED NOT DETECTED Final   Haemophilus influenzae NOT DETECTED NOT DETECTED Final   Neisseria meningitidis NOT DETECTED NOT DETECTED Final   Pseudomonas aeruginosa NOT DETECTED NOT DETECTED Final   Stenotrophomonas maltophilia NOT DETECTED NOT DETECTED Final   Candida albicans NOT DETECTED NOT DETECTED Final   Candida auris  NOT DETECTED NOT DETECTED Final   Candida glabrata NOT DETECTED NOT DETECTED Final   Candida krusei NOT DETECTED NOT DETECTED Final   Candida parapsilosis NOT DETECTED NOT DETECTED Final   Candida tropicalis NOT DETECTED NOT DETECTED Final   Cryptococcus neoformans/gattii NOT DETECTED NOT DETECTED Final    Comment: Performed at [facility], [address] [place], [place] [address]  Culture, blood (Routine x 2)     Status: None (Preliminary result)   Collection Time: 06/17/20  2:17 PM   Specimen: BLOOD  Result Value Ref Range Status   Specimen Description BLOOD RIGHT ASSIST CONTROL  Final   Special Requests  Final    BOTTLES DRAWN AEROBIC AND ANAEROBIC Blood Culture results may not be optimal due to an excessive volume of blood received in culture bottles   Culture   Final    NO GROWTH 4 DAYS Performed at [facility], [address] [place], [place] [address]    Report Status PENDING  Incomplete  Culture, blood (Routine x 2)     Status: None (Preliminary result)   Collection Time: 06/17/20  3:12 PM   Specimen: BLOOD  Result Value Ref Range Status   Specimen Description BLOOD LEFT ANTECUBITAL  Final   Special Requests   Final    BOTTLES DRAWN AEROBIC AND ANAEROBIC Blood Culture adequate volume   Culture   Final    NO GROWTH 4 DAYS Performed at [facility], [address], [place], [place] [address]    Report Status PENDING  Incomplete  Respiratory Panel by RT PCR (Flu A&B, Covid) - Nasopharyngeal Swab     Status: None   Collection Time: 06/17/20  7:07 PM   Specimen: Nasopharyngeal Swab  Result Value Ref Range Status   SARS Coronavirus 2 by RT PCR NEGATIVE NEGATIVE Final    Comment: (NOTE) SARS-CoV-2 target nucleic acids are NOT DETECTED.  The SARS-CoV-2 RNA is generally detectable in upper respiratoy specimens during the acute phase of infection. The lowest concentration of SARS-CoV-2 viral copies this assay can detect is 131 copies/mL. A  negative result does not preclude SARS-Cov-2 infection and should not be used as the sole basis for treatment or other patient management decisions. A negative result may occur with  improper specimen collection/handling, submission of specimen other than nasopharyngeal swab, presence of viral mutation(s) within the areas targeted by this assay, and inadequate number of viral copies (<131 copies/mL). A negative result must be combined with clinical observations, patient history, and epidemiological information. The expected result is Negative.  Fact Sheet for Patients:  https://www.moore.com/  Fact Sheet for Healthcare Providers:  https://www.young.biz/  This test is no t yet approved or cleared by the [place] FDA and  has been authorized for detection and/or diagnosis of SARS-CoV-2 by FDA under an Emergency Use Authorization (EUA). This EUA will remain  in effect (meaning this test can be used) for the duration of the COVID-19 declaration under Section 564(b)(1) of the Act, 21 U.S.C. section 360bbb-3(b)(1), unless the authorization is terminated or revoked sooner.     Influenza A by PCR NEGATIVE NEGATIVE Final   Influenza B by PCR NEGATIVE NEGATIVE Final    Comment: (NOTE) The Xpert Xpress SARS-CoV-2/FLU/RSV assay is intended as an aid in  the diagnosis of influenza from Nasopharyngeal swab specimens and  should not be used as a sole basis for treatment. Nasal washings and  aspirates are unacceptable for Xpert Xpress SARS-CoV-2/FLU/RSV  testing.  Fact Sheet for Patients: https://www.moore.com/  Fact Sheet for Healthcare Providers: https://www.young.biz/  This test is not yet approved or cleared by the [place] FDA and  has been authorized for detection and/or diagnosis of SARS-CoV-2 by  FDA under an Emergency Use Authorization (EUA). This EUA will remain  in effect (meaning this test can  be used) for the duration of the  Covid-19 declaration under Section 564(b)(1) of the Act, 21  U.S.C. section 360bbb-3(b)(1), unless the authorization is  terminated or revoked. Performed at [facility], [address], [place], [place] [address]      Time coordinating discharge: Over 30 minutes  SIGNED:   Pennie Banter, DO  [facility] 06/21/2020, 1:02 PM   If 7PM-7AM, please contact night-coverage [email or web address]

## 2020-06-21 NOTE — Progress Notes (Signed)
Mobility Specialist - Progress Note   06/21/20 1100  Mobility  Activity Ambulated in hall  Level of Assistance Standby assist, set-up cues, supervision of patient - no hands on  Assistive Device Front wheel Iafrate  Distance Ambulated (ft) 200 ft  Mobility Response Tolerated well  Mobility performed by Mobility specialist  $Mobility charge 1 Mobility    Pre-mobility: 94 HR, 100% SpO2 Post-mobility: 88 HR, 98% SpO2   Pt was received ambulating in room upon arrival. Pt agreed to session. Pt was AOx4, however, appeared cognitively confused as he would get easily distracted in conversation and constantly state that his birthday is tomorrow despite giving me today's correct date earlier. Pt ambulated around nursing station with RW and no LOB. No heavy breathing noted. Pt appeared with wider gait this session for safe ambulation, but needed several cues to keep RW close to body. Upon arriving to room, noted that pt also ambulates without AD. Overall, pt tolerated session well. Pt was left EOB with all needs in reach.    Filiberto Pinks Mobility Specialist 06/21/20, 11:18 AM

## 2020-06-21 NOTE — Care Management Important Message (Signed)
Important Message  Patient Details  Name: Johnny [name] MRN: [id number] Date of Birth: [birth year]   Medicare Important Message Given:  Yes     [name] 06/21/2020, 12:00 PM

## 2020-06-21 NOTE — Progress Notes (Signed)
*  PRELIMINARY RESULTS* Echocardiogram 2D Echocardiogram has been performed. Suboptimal echo was performed since pt is uncooperative and unable to stay in LLD.  Joanette Gula Shaqueta Casady 06/21/2020, 9:30 AM

## 2020-06-22 LAB — CULTURE, BLOOD (ROUTINE X 2)
Culture: NO GROWTH
Culture: NO GROWTH
Special Requests: ADEQUATE

## 2020-06-29 ENCOUNTER — Encounter: Payer: Self-pay | Admitting: Emergency Medicine

## 2020-06-29 ENCOUNTER — Other Ambulatory Visit: Payer: Self-pay

## 2020-06-29 ENCOUNTER — Emergency Department: Payer: Medicare Other

## 2020-06-29 ENCOUNTER — Emergency Department
Admission: EM | Admit: 2020-06-29 | Discharge: 2020-06-29 | Disposition: A | Discharge status: Home / Self Care | Payer: Medicare Other | Attending: Emergency Medicine | Admitting: Emergency Medicine

## 2020-06-29 DIAGNOSIS — Z794 Long term (current) use of insulin: Secondary | ICD-10-CM | POA: Insufficient documentation

## 2020-06-29 DIAGNOSIS — I10 Essential (primary) hypertension: Secondary | ICD-10-CM | POA: Diagnosis not present

## 2020-06-29 DIAGNOSIS — F17210 Nicotine dependence, cigarettes, uncomplicated: Secondary | ICD-10-CM | POA: Diagnosis not present

## 2020-06-29 DIAGNOSIS — Z7982 Long term (current) use of aspirin: Secondary | ICD-10-CM | POA: Diagnosis not present

## 2020-06-29 DIAGNOSIS — M7542 Impingement syndrome of left shoulder: Secondary | ICD-10-CM | POA: Diagnosis not present

## 2020-06-29 DIAGNOSIS — E11649 Type 2 diabetes mellitus with hypoglycemia without coma: Secondary | ICD-10-CM | POA: Insufficient documentation

## 2020-06-29 DIAGNOSIS — Z7984 Long term (current) use of oral hypoglycemic drugs: Secondary | ICD-10-CM | POA: Diagnosis not present

## 2020-06-29 DIAGNOSIS — M25512 Pain in left shoulder: Secondary | ICD-10-CM | POA: Diagnosis present

## 2020-06-29 DIAGNOSIS — Z79899 Other long term (current) drug therapy: Secondary | ICD-10-CM | POA: Diagnosis not present

## 2020-06-29 NOTE — ED Provider Notes (Signed)
[facility] Emergency Department Provider Note  ____________________________________________  Time seen: Approximately 6:28 PM  I have reviewed the triage vital signs and the nursing notes.   HISTORY  Chief Complaint Shoulder Pain    HPI Johnny [name] is a 64 y.o. male who presents the emergency department in care of his group home for complaint of ongoing left shoulder pain.  Patient sustained a mechanical fall several weeks ago.  He is continuing to complain of left shoulder pain.  Per the patient and group home, patient has been able to move the left arm freely without any noticed deficits.  There was no other reported injury or complaint.  Patient has a history of anemia, diabetes, GERD, hypertension, MI, chronic bronchitis, schizophrenia.  No complaints of chronic medical issues.   No medications for his complaint prior to arrival.  Pain is localized to the anterior aspect of the shoulder over the distal half of the clavicle.        Past Medical History:  Diagnosis Date  . Anemia   . Clostridium difficile colitis   . Diabetes mellitus without complication (HCC)   . DM (diabetes mellitus) (HCC)   . GERD (gastroesophageal reflux disease)   . Herpes   . Hyperlipidemia   . Hypertension   . Schizophrenia [facility])     Patient Active Problem List   Diagnosis Date Noted  . Bacteremia 06/17/2020  . Acute renal failure (ARF) (HCC) 07/05/2015  . SINUS TACHYCARDIA 06/10/2008  . HYPOTENSION, ORTHOSTATIC 06/10/2008  . Type 2 diabetes mellitus with hyperlipidemia (HCC) 05/19/2008  . HYPOGLYCEMIA 04/27/2008  . HYPERLIPIDEMIA 01/22/2008  . Schizophrenia (HCC) 01/22/2008  . ANXIETY 01/22/2008  . DEPRESSION 01/22/2008  . Essential hypertension 01/22/2008  . MYOCARDIAL INFARCTION, HX OF 01/22/2008  . BRONCHITIS, CHRONIC 01/22/2008  . GERD 01/22/2008    Past Surgical History:  Procedure Laterality Date  . CHOLECYSTECTOMY      Prior to Admission  medications   Medication Sig Start Date End Date Taking? Authorizing Provider  albuterol (PROVENTIL HFA;VENTOLIN HFA) 108 (90 BASE) MCG/ACT inhaler Inhale 2 puffs into the lungs every 4 (four) hours as needed for wheezing or shortness of breath.    [provider]  ammonium lactate (AMLACTIN) 12 % cream Apply 1 g topically daily.     [provider]  aspirin 81 MG chewable tablet Chew 81 mg by mouth daily.     [provider]  benazepril (LOTENSIN) 20 MG tablet Take 20 mg by mouth daily. 06/11/20   [provider]  benztropine (COGENTIN) 1 MG tablet Take 1 mg by mouth 2 (two) times daily.    [provider]  BREZTRI AEROSPHERE 160-9-4.8 MCG/ACT AERO Inhale 2 puffs into the lungs 2 (two) times daily. 05/14/20   [provider]  buPROPion (WELLBUTRIN XL) 300 MG 24 hr tablet Take 300 mg by mouth daily. 06/11/20   [provider]  Canagliflozin-Metformin HCl (INVOKAMET) 50-1000 MG TABS Take 1 tablet by mouth 2 (two) times daily with a meal.    [provider]  cephALEXin (KEFLEX) 500 MG capsule Take 1 capsule (500 mg total) by mouth 4 (four) times daily for 10 days. 06/21/20 07/01/20  Pennie Banter, DO  clonazePAM (KLONOPIN) 1 MG tablet Take 1 mg by mouth 3 (three) times daily.    [provider]  colesevelam (WELCHOL) 625 MG tablet Take 1,875 mg by mouth 2 (two) times daily.     [provider]  divalproex (DEPAKOTE ER)  500 MG 24 hr tablet Take 500 mg by mouth 2 (two) times daily.     [provider]  fluPHENAZine (PROLIXIN) 5 MG tablet Take 5 mg by mouth 2 (two) times daily. Pt also takes daily as needed for psychosis/agitation.    [provider]  hydrochlorothiazide (MICROZIDE) 12.5 MG capsule Take 12.5 mg by mouth daily. 06/11/20   [provider]  insulin glargine (LANTUS) 100 UNIT/ML injection Inject 30 Units into the skin at bedtime.    [provider]  INVEGA TRINZA 819  MG/2.625ML injection Inject 819 mg into the muscle every 30 (thirty) days. 06/01/20   [provider]  LINZESS 145 MCG CAPS capsule Take 145 mcg by mouth daily. 06/11/20   [provider]  Memantine HCl-Donepezil HCl (NAMZARIC) 28-10 MG CP24 Take 1 capsule by mouth at bedtime.     [provider]  omeprazole (PRILOSEC) 40 MG capsule Take 40 mg by mouth daily. 06/11/20   [provider]  pioglitazone (ACTOS) 45 MG tablet Take 45 mg by mouth daily.    [provider]  polyethylene glycol (MIRALAX / GLYCOLAX) packet Take 17 g by mouth daily.    [provider]  pravastatin (PRAVACHOL) 40 MG tablet Take 40 mg by mouth at bedtime.     [provider]  QUEtiapine (SEROQUEL) 400 MG tablet Take 600 mg by mouth at bedtime.    [provider]  sitaGLIPtin (JANUVIA) 100 MG tablet Take 100 mg by mouth daily.    [provider]  SYNJARDY 12.01-999 MG TABS Take 1 tablet by mouth 2 (two) times daily. 06/11/20   [provider]  tiotropium (SPIRIVA) 18 MCG inhalation capsule Place 18 mcg into inhaler and inhale daily.    [provider]  traZODone (DESYREL) 100 MG tablet Take 200 mg by mouth at bedtime.    [provider]  Vitamin D, Ergocalciferol, (DRISDOL) 50000 UNITS CAPS capsule Take 50,000 Units by mouth every 7 (seven) days.    [provider]    Allergies Fish allergy and Shellfish allergy  Family History  Problem Relation Age of Onset  . Diabetes Mother   . Kidney failure Mother   . Diabetes Father   . CAD Brother     Social History Social History   Tobacco Use  . Smoking status: Current Every Day Smoker    Packs/day: 1.00    Types: Cigarettes  . Smokeless tobacco: Never Used  Substance Use Topics  . Alcohol use: No  . Drug use: No     Review of Systems  Constitutional: No fever/chills Eyes: No visual changes. No discharge ENT: No upper respiratory  complaints. Cardiovascular: no chest pain. Respiratory: no cough. No SOB. Gastrointestinal: No abdominal pain.  No nausea, no vomiting.  No diarrhea.  No constipation. Musculoskeletal: Left shoulder pain, injury several weeks ago, ongoing pain Skin: Negative for rash, abrasions, lacerations, ecchymosis. Neurological: Negative for headaches, focal weakness or numbness.  10 System ROS otherwise negative.  ____________________________________________   PHYSICAL EXAM:  VITAL SIGNS: ED Triage Vitals [06/29/20 1816]  Enc Vitals Group     BP 101/68     Pulse Rate 95     Resp 18     Temp 98.5 F (36.9 C)     Temp Source Oral     SpO2 99 %     Weight 160 lb (72.6 kg)     Height 6' (1.829 m)     Head Circumference  Peak Flow      Pain Score 0     Pain Loc      Pain Edu?      Excl. in GC?      Constitutional: Alert and oriented. Well appearing and in no acute distress. Eyes: Conjunctivae are normal. PERRL. EOMI. Head: Atraumatic. ENT:      Ears:       Nose: No congestion/rhinnorhea.      Mouth/Throat: Mucous membranes are moist.  Neck: No stridor.  No cervical spine tenderness to palpation.  Cardiovascular: Normal rate, regular rhythm. Normal S1 and S2.  Good peripheral circulation. Respiratory: Normal respiratory effort without tachypnea or retractions. Lungs CTAB. Good air entry to the bases with no decreased or absent breath sounds. Musculoskeletal: Full range of motion to all extremities. No gross deformities appreciated.  Visualization of the left shoulder reveals no visible deformity.  No abrasions or lacerations.  Full range of motion at this time.  Patient reports tenderness over the acromioclavicular joint space with palpation.  No other significant tenderness.  No palpable abnormality about the left shoulder.  Examination of the cervical spine, left elbow is unremarkable.  Radial pulses sensation intact distally. Neurologic:  Normal speech and language. No gross  focal neurologic deficits are appreciated.  Skin:  Skin is warm, dry and intact. No rash noted. Psychiatric: Mood and affect are normal. Speech and behavior are normal. Patient exhibits appropriate insight and judgement.   ____________________________________________   LABS (all labs ordered are listed, but only abnormal results are displayed)  Labs Reviewed - No data to display ____________________________________________  EKG   ____________________________________________  RADIOLOGY I personally viewed and evaluated these images as part of my medical decision making, as well as reviewing the written report by the radiologist.  ED Provider Interpretation: No findings concerning for acute fracture.  DG Shoulder Left  Result Date: 06/29/2020 CLINICAL DATA:  Fall several weeks ago.  Shoulder pain EXAM: LEFT SHOULDER - 2+ VIEW COMPARISON:  None. FINDINGS: Deformity in the proximal left humeral likely related to old healed fracture. No acute fracture visualized. Joint space is maintained. Mild joint space narrowing in the left AC joint. No subluxation or dislocation. IMPRESSION: Chronic appearing deformity within the proximal left humerus, likely related to old healed fracture. No acute bony abnormality. Electronically Signed   By: Charlett Nose M.D.   On: 06/29/2020 19:10    ____________________________________________    PROCEDURES  Procedure(s) performed:    Procedures    Medications - No data to display   ____________________________________________   INITIAL IMPRESSION / ASSESSMENT AND PLAN / ED COURSE  Pertinent labs & imaging results that were available during my care of the patient were reviewed by me and considered in my medical decision making (see chart for details).  Review of the [place] CSRS was performed in accordance of the [facility] prior to dispensing any controlled drugs.           Patient's diagnosis is consistent with impingement syndrome of the  shoulder.  Patient presented to the emergency department complaining of shoulder pain after a fall several weeks ago.  Patient has been using his arm freely according to his group home staff that is with the patient.  As patient continued to complain of pain, he is brought to emergency department for evaluation.  Shoulder x-ray reveals no acute findings.  Likely old fracture deformity.  This was likely not sustained from this reported injury.  No reported history of previous shoulder injuries and no  evidence in medical chart of history of fracture.  Patient is a poor historian and the group home staff do not know of any previous injury.  Again however the x-ray appears to be a chronic old deformity.  At this time with patient being tender over the acromioclavicular joint, I feel that he likely has some mild impingement of the shoulder.  Patient already takes anti-inflammatories on a regular basis.  Continue same.  Follow-up with orthopedics of symptoms worsen or do not improve with antiinflammatory use.  No further work-up deemed necessary at this time..  Follow-up primary care as needed.  Return precautions discussed with the patient and group home staff.  Patient is given ED precautions to return to the ED for any worsening or new symptoms.     ____________________________________________  FINAL CLINICAL IMPRESSION(S) / ED DIAGNOSES  Final diagnoses:  Impingement syndrome of left shoulder      NEW MEDICATIONS STARTED DURING THIS VISIT:  ED Discharge Orders    None          This chart was dictated using voice recognition software/Dragon. Despite best efforts to proofread, errors can occur which can change the meaning. Any change was purely unintentional.    Racheal PatchesCuthriell, Bryella Diviney D, PA-C 06/29/20 1937    Sharman CheekStafford, Phillip, MD 06/29/20 [id number]

## 2020-06-29 NOTE — ED Triage Notes (Signed)
Pt presents to ED via [facility] from [facility] with staff member from facility. Pt states fell several weeks ago and landed on L side, c/o L shoulder pain. Pt states pain with movement. Pt with noted full ROM to L arm, currently leaning on L arm on bench seat in triage.

## 2020-11-10 ENCOUNTER — Emergency Department: Payer: Medicare Other

## 2020-11-10 ENCOUNTER — Other Ambulatory Visit: Payer: Self-pay

## 2020-11-10 ENCOUNTER — Emergency Department
Admission: EM | Admit: 2020-11-10 | Discharge: 2020-11-10 | Disposition: A | Discharge status: Home / Self Care | Payer: Medicare Other | Source: Home / Self Care | Attending: Emergency Medicine | Admitting: Emergency Medicine

## 2020-11-10 DIAGNOSIS — I10 Essential (primary) hypertension: Secondary | ICD-10-CM | POA: Insufficient documentation

## 2020-11-10 DIAGNOSIS — R072 Precordial pain: Secondary | ICD-10-CM | POA: Insufficient documentation

## 2020-11-10 DIAGNOSIS — Z79899 Other long term (current) drug therapy: Secondary | ICD-10-CM | POA: Insufficient documentation

## 2020-11-10 DIAGNOSIS — Z7982 Long term (current) use of aspirin: Secondary | ICD-10-CM | POA: Insufficient documentation

## 2020-11-10 DIAGNOSIS — F17210 Nicotine dependence, cigarettes, uncomplicated: Secondary | ICD-10-CM | POA: Insufficient documentation

## 2020-11-10 DIAGNOSIS — R079 Chest pain, unspecified: Secondary | ICD-10-CM

## 2020-11-10 DIAGNOSIS — Z794 Long term (current) use of insulin: Secondary | ICD-10-CM | POA: Insufficient documentation

## 2020-11-10 DIAGNOSIS — R112 Nausea with vomiting, unspecified: Secondary | ICD-10-CM | POA: Insufficient documentation

## 2020-11-10 DIAGNOSIS — E119 Type 2 diabetes mellitus without complications: Secondary | ICD-10-CM | POA: Insufficient documentation

## 2020-11-10 DIAGNOSIS — R1013 Epigastric pain: Secondary | ICD-10-CM | POA: Insufficient documentation

## 2020-11-10 LAB — BASIC METABOLIC PANEL
Anion gap: 15 (ref 5–15)
BUN: 34 mg/dL — ABNORMAL HIGH (ref 8–23)
CO2: 26 mmol/L (ref 22–32)
Calcium: 10.2 mg/dL (ref 8.9–10.3)
Chloride: 93 mmol/L — ABNORMAL LOW (ref 98–111)
Creatinine, Ser: 1.17 mg/dL (ref 0.61–1.24)
GFR, Estimated: >60 mL/min (ref >60)
Glucose, Bld: 157 mg/dL — ABNORMAL HIGH (ref 70–99)
Potassium: 4.1 mmol/L (ref 3.5–5.1)
Sodium: 134 mmol/L — ABNORMAL LOW (ref 135–145)

## 2020-11-10 LAB — CBC
HCT: 39.5 % (ref 39.0–52.0)
Hemoglobin: 13.4 g/dL (ref 13.0–17.0)
MCH: 29.8 pg (ref 26.0–34.0)
MCHC: 33.9 g/dL (ref 30.0–36.0)
MCV: 88 fL (ref 80.0–100.0)
Platelets: 233 10*3/uL (ref 150–400)
RBC: 4.49 MIL/uL (ref 4.22–5.81)
RDW: 15 % (ref 11.5–15.5)
WBC: 12 10*3/uL — ABNORMAL HIGH (ref 4.0–10.5)
nRBC: 0 % (ref 0.0–0.2)

## 2020-11-10 LAB — TROPONIN I (HIGH SENSITIVITY): Troponin I (High Sensitivity): 4 ng/L (ref <18)

## 2020-11-10 MED ORDER — ONDANSETRON 8 MG PO TBDP
8.0000 mg | ORAL_TABLET | Freq: Once | ORAL | Status: AC
  Administered 2020-11-10: 8 mg via ORAL
  Filled 2020-11-10: qty 1

## 2020-11-10 MED ORDER — ONDANSETRON 4 MG PO TBDP: 4.0000 mg | ORAL_TABLET | Freq: Three times a day (TID) | ORAL | 0 refills | Status: DC | PRN

## 2020-11-10 NOTE — ED Triage Notes (Signed)
Pt comes via POV from home with c/o CP and SOb. Pt states this started this am. Pt was at PCP and vomited before being sent here.  Pt states he thought it was indigestion. At first. Pt states mid sternal CP.  Pt denies any current CP

## 2020-11-10 NOTE — ED Provider Notes (Signed)
[facility] Emergency Department Provider Note   ____________________________________________   Event Date/Time   First MD Initiated Contact with Patient 11/10/20 1848     (approximate)  I have reviewed the triage vital signs and the nursing notes.   HISTORY  Chief Complaint Chest Pain and Shortness of Breath    HPI Johnny [name] is a 65 y.o. male with a past medical history of type 2 diabetes, GERD, and hypertension who presents for midepigastric abdominal pain, shortness of breath, and nausea/vomiting that began this morning.  Patient states that this epigastric pain is been worsening since onset and is worsened with vomiting.  Patient denies any recent travel, food out of the ordinary, or sick contacts.  Patient describes a burning epigastric pain that radiates up to his substernal region.  Patient has not tried any medications for this pain.  Patient denies any other exacerbating or relieving factors.  Patient currently denies any vision changes, tinnitus, difficulty speaking, facial droop, sore throat, diarrhea, dysuria, or weakness/numbness/paresthesias in any extremity         Past Medical History:  Diagnosis Date  . Anemia   . Clostridium difficile colitis   . Diabetes mellitus without complication (HCC)   . DM (diabetes mellitus) (HCC)   . GERD (gastroesophageal reflux disease)   . Herpes   . Hyperlipidemia   . Hypertension   . Schizophrenia [facility])     Patient Active Problem List   Diagnosis Date Noted  . Bacteremia 06/17/2020  . Acute renal failure (ARF) (HCC) 07/05/2015  . SINUS TACHYCARDIA 06/10/2008  . HYPOTENSION, ORTHOSTATIC 06/10/2008  . Type 2 diabetes mellitus with hyperlipidemia (HCC) 05/19/2008  . HYPOGLYCEMIA 04/27/2008  . HYPERLIPIDEMIA 01/22/2008  . Schizophrenia (HCC) 01/22/2008  . ANXIETY 01/22/2008  . DEPRESSION 01/22/2008  . Essential hypertension 01/22/2008  . MYOCARDIAL INFARCTION, HX OF 01/22/2008  .  BRONCHITIS, CHRONIC 01/22/2008  . GERD 01/22/2008    Past Surgical History:  Procedure Laterality Date  . CHOLECYSTECTOMY      Prior to Admission medications   Medication Sig Start Date End Date Taking? Authorizing Provider  ondansetron (ZOFRAN ODT) 4 MG disintegrating tablet Take 1 tablet (4 mg total) by mouth every 8 (eight) hours as needed for nausea or vomiting. 11/10/20  Yes Merwyn Katos, MD  albuterol (PROVENTIL HFA;VENTOLIN HFA) 108 (90 BASE) MCG/ACT inhaler Inhale 2 puffs into the lungs every 4 (four) hours as needed for wheezing or shortness of breath.    [provider]  ammonium lactate (AMLACTIN) 12 % cream Apply 1 g topically daily.     [provider]  aspirin 81 MG chewable tablet Chew 81 mg by mouth daily.     [provider]  benazepril (LOTENSIN) 20 MG tablet Take 20 mg by mouth daily. 06/11/20   [provider]  benztropine (COGENTIN) 1 MG tablet Take 1 mg by mouth 2 (two) times daily.    [provider]  BREZTRI AEROSPHERE 160-9-4.8 MCG/ACT AERO Inhale 2 puffs into the lungs 2 (two) times daily. 05/14/20   [provider]  buPROPion (WELLBUTRIN XL) 300 MG 24 hr tablet Take 300 mg by mouth daily. 06/11/20   [provider]  Canagliflozin-Metformin HCl (INVOKAMET) 50-1000 MG TABS Take 1 tablet by mouth 2 (two) times daily with a meal.    [provider]  clonazePAM (KLONOPIN) 1 MG tablet Take 1 mg by mouth 3 (three) times daily.    [provider]  colesevelam (WELCHOL) 625 MG  tablet Take 1,875 mg by mouth 2 (two) times daily.     [provider]  divalproex (DEPAKOTE ER) 500 MG 24 hr tablet Take 500 mg by mouth 2 (two) times daily.     [provider]  fluPHENAZine (PROLIXIN) 5 MG tablet Take 5 mg by mouth 2 (two) times daily. Pt also takes daily as needed for psychosis/agitation.    [provider]  hydrochlorothiazide (MICROZIDE) 12.5 MG capsule Take 12.5 mg by mouth  daily. 06/11/20   [provider]  insulin glargine (LANTUS) 100 UNIT/ML injection Inject 30 Units into the skin at bedtime.    [provider]  INVEGA TRINZA 819 MG/2.625ML injection Inject 819 mg into the muscle every 30 (thirty) days. 06/01/20   [provider]  LINZESS 145 MCG CAPS capsule Take 145 mcg by mouth daily. 06/11/20   [provider]  Memantine HCl-Donepezil HCl (NAMZARIC) 28-10 MG CP24 Take 1 capsule by mouth at bedtime.     [provider]  omeprazole (PRILOSEC) 40 MG capsule Take 40 mg by mouth daily. 06/11/20   [provider]  pioglitazone (ACTOS) 45 MG tablet Take 45 mg by mouth daily.    [provider]  polyethylene glycol (MIRALAX / GLYCOLAX) packet Take 17 g by mouth daily.    [provider]  pravastatin (PRAVACHOL) 40 MG tablet Take 40 mg by mouth at bedtime.     [provider]  QUEtiapine (SEROQUEL) 400 MG tablet Take 600 mg by mouth at bedtime.    [provider]  sitaGLIPtin (JANUVIA) 100 MG tablet Take 100 mg by mouth daily.    [provider]  SYNJARDY 12.01-999 MG TABS Take 1 tablet by mouth 2 (two) times daily. 06/11/20   [provider]  tiotropium (SPIRIVA) 18 MCG inhalation capsule Place 18 mcg into inhaler and inhale daily.    [provider]  traZODone (DESYREL) 100 MG tablet Take 200 mg by mouth at bedtime.    [provider]  Vitamin D, Ergocalciferol, (DRISDOL) 50000 UNITS CAPS capsule Take 50,000 Units by mouth every 7 (seven) days.    [provider]    Allergies Fish allergy and Shellfish allergy  Family History  Problem Relation Age of Onset  . Diabetes Mother   . Kidney failure Mother   . Diabetes Father   . CAD Brother     Social History Social History   Tobacco Use  . Smoking status: Current Every Day Smoker    Packs/day: 1.00    Types: Cigarettes  . Smokeless tobacco: Never Used  Substance Use Topics   . Alcohol use: No  . Drug use: No    Review of Systems Constitutional: No fever/chills Eyes: No visual changes. ENT: No sore throat. Cardiovascular: Endorses chest pain. Respiratory: Endorses shortness of breath. Gastrointestinal: Endorses epigastric pain, nausea, and vomiting no diarrhea. Genitourinary: Negative for dysuria. Musculoskeletal: Negative for acute arthralgias Skin: Negative for rash. Neurological: Negative for headaches, weakness/numbness/paresthesias in any extremity Psychiatric: Negative for suicidal ideation/homicidal ideation   ____________________________________________   PHYSICAL EXAM:  VITAL SIGNS: ED Triage Vitals  Enc Vitals Group     BP 11/10/20 1633 (!) 144/84     Pulse Rate 11/10/20 1633 92     Resp 11/10/20 1633 18     Temp 11/10/20 1633 99.3 F (37.4 C)     Temp Source 11/10/20 1633 Oral     SpO2 11/10/20 1633 99 %     Weight 11/10/20 1634 160  lb (72.6 kg)     Height 11/10/20 1634 5\' 11"  (1.803 m)     Head Circumference --      Peak Flow --      Pain Score 11/10/20 1638 0     Pain Loc --      Pain Edu? --      Excl. in GC? --    Constitutional: Alert and oriented. Well appearing and in no acute distress. Eyes: Conjunctivae are normal. PERRL. Head: Atraumatic. Nose: No congestion/rhinnorhea. Mouth/Throat: Mucous membranes are moist. Neck: No stridor Cardiovascular: Grossly normal heart sounds.  Good peripheral circulation. Respiratory: Normal respiratory effort.  No retractions. Gastrointestinal: Soft and nontender. No distention. Musculoskeletal: No obvious deformities Neurologic:  Normal speech and language. No gross focal neurologic deficits are appreciated. Skin:  Skin is warm and dry. No rash noted. Psychiatric: Mood and affect are normal. Speech and behavior are normal.  ____________________________________________   LABS (all labs ordered are listed, but only abnormal results are displayed)  Labs Reviewed  BASIC  METABOLIC PANEL - Abnormal; Notable for the following components:      Result Value   Sodium 134 (*)    Chloride 93 (*)    Glucose, Bld 157 (*)    BUN 34 (*)    All other components within normal limits  CBC - Abnormal; Notable for the following components:   WBC 12.0 (*)    All other components within normal limits  TROPONIN I (HIGH SENSITIVITY)   ____________________________________________  EKG  ED ECG REPORT I, 01/10/21, the attending physician, personally viewed and interpreted this ECG.  Date: 11/10/2020 EKG Time: 1641 Rate: 97 Rhythm: normal sinus rhythm QRS Axis: normal Intervals: normal ST/T Wave abnormalities: normal Narrative Interpretation: no evidence of acute ischemia  ____________________________________________  RADIOLOGY  ED MD interpretation: 2 view chest x-ray shows no evidence of acute abnormalities including no pneumonia, pneumothorax, or widened mediastinum  Official radiology report(s): DG Chest 2 View  Result Date: 11/10/2020 CLINICAL DATA:  Midsternal chest pain and shortness of breath, belching EXAM: CHEST - 2 VIEW COMPARISON:  06/17/2020 FINDINGS: Frontal and lateral views of the chest demonstrate a stable cardiac silhouette. Chronic areas of scarring and atelectasis are seen, greatest at the right lung base. Trace right pleural effusion versus pleural thickening again noted, stable. No acute airspace disease. No pneumothorax. No acute bony abnormalities. IMPRESSION: 1. Chronic areas of scarring and atelectasis, greatest at the right lung base. 2. Trace right pleural effusion versus pleural thickening, stable. 3. No acute intrathoracic process. Electronically Signed   By: 08/17/2020 M.D.   On: 11/10/2020 18:03    ____________________________________________   PROCEDURES  Procedure(s) performed (including Critical Care):  .1-3 Lead EKG Interpretation Performed by: 01/10/2021, MD Authorized by: Merwyn Katos, MD      Interpretation: normal     ECG rate:  92   ECG rate assessment: normal     Rhythm: sinus rhythm     Ectopy: none     Conduction: normal       ____________________________________________   INITIAL IMPRESSION / ASSESSMENT AND PLAN / ED COURSE  As part of my medical decision making, I reviewed the following data within the electronic MEDICAL RECORD NUMBER Nursing notes reviewed and incorporated, Labs reviewed, EKG interpreted, Old chart reviewed, Radiograph reviewed and Notes from prior ED visits reviewed and incorporated        Patient presents for acute nausea/vomiting The cause of the patients symptoms is not clear,  but the patient is overall well appearing and is suspected to have a transient course of illness.  Given History and Exam there does not appear to be an emergent cause of the symptoms such as small bowel obstruction, coronary syndrome, bowel ischemia, DKA, pancreatitis, appendicitis, other acute abdomen or other emergent problem.  Reassessment: After treatment, the patient is feeling much better, tolerating PO fluids, and shows no signs of dehydration.   Disposition: Discharge home with prompt primary care physician follow up in the next 48 hours. Strict return precautions discussed.      ____________________________________________   FINAL CLINICAL IMPRESSION(S) / ED DIAGNOSES  Final diagnoses:  Chest pain, unspecified type  Epigastric pain  Non-intractable vomiting with nausea, unspecified vomiting type     ED Discharge Orders         Ordered    ondansetron (ZOFRAN ODT) 4 MG disintegrating tablet  Every 8 hours PRN        11/10/20 1959           Note:  This document was prepared using Dragon voice recognition software and may include unintentional dictation errors.   Merwyn KatosBradler, Evan K, MD 11/10/20 2001

## 2020-11-11 ENCOUNTER — Encounter: Payer: Self-pay | Admitting: Emergency Medicine

## 2020-11-11 ENCOUNTER — Other Ambulatory Visit: Payer: Self-pay

## 2020-11-11 ENCOUNTER — Inpatient Hospital Stay
Admission: EM | Admit: 2020-11-11 | Discharge: 2020-11-13 | DRG: 638 | Disposition: A | Discharge status: Home / Self Care | Payer: Medicare Other | Attending: Internal Medicine | Admitting: Internal Medicine

## 2020-11-11 ENCOUNTER — Emergency Department: Payer: Medicare Other

## 2020-11-11 DIAGNOSIS — I700 Atherosclerosis of aorta: Secondary | ICD-10-CM | POA: Diagnosis present

## 2020-11-11 DIAGNOSIS — Z91013 Allergy to seafood: Secondary | ICD-10-CM

## 2020-11-11 DIAGNOSIS — Z8249 Family history of ischemic heart disease and other diseases of the circulatory system: Secondary | ICD-10-CM

## 2020-11-11 DIAGNOSIS — K922 Gastrointestinal hemorrhage, unspecified: Secondary | ICD-10-CM | POA: Diagnosis present

## 2020-11-11 DIAGNOSIS — E860 Dehydration: Secondary | ICD-10-CM | POA: Diagnosis present

## 2020-11-11 DIAGNOSIS — Z9049 Acquired absence of other specified parts of digestive tract: Secondary | ICD-10-CM

## 2020-11-11 DIAGNOSIS — K2970 Gastritis, unspecified, without bleeding: Secondary | ICD-10-CM | POA: Diagnosis present

## 2020-11-11 DIAGNOSIS — F32A Depression, unspecified: Secondary | ICD-10-CM | POA: Diagnosis present

## 2020-11-11 DIAGNOSIS — R1110 Vomiting, unspecified: Secondary | ICD-10-CM

## 2020-11-11 DIAGNOSIS — R110 Nausea: Secondary | ICD-10-CM

## 2020-11-11 DIAGNOSIS — F209 Schizophrenia, unspecified: Secondary | ICD-10-CM | POA: Diagnosis present

## 2020-11-11 DIAGNOSIS — K2100 Gastro-esophageal reflux disease with esophagitis, without bleeding: Secondary | ICD-10-CM | POA: Diagnosis present

## 2020-11-11 DIAGNOSIS — Z794 Long term (current) use of insulin: Secondary | ICD-10-CM

## 2020-11-11 DIAGNOSIS — F419 Anxiety disorder, unspecified: Secondary | ICD-10-CM | POA: Diagnosis present

## 2020-11-11 DIAGNOSIS — Z833 Family history of diabetes mellitus: Secondary | ICD-10-CM

## 2020-11-11 DIAGNOSIS — E1165 Type 2 diabetes mellitus with hyperglycemia: Secondary | ICD-10-CM | POA: Diagnosis not present

## 2020-11-11 DIAGNOSIS — Z7951 Long term (current) use of inhaled steroids: Secondary | ICD-10-CM

## 2020-11-11 DIAGNOSIS — K209 Esophagitis, unspecified without bleeding: Secondary | ICD-10-CM | POA: Diagnosis not present

## 2020-11-11 DIAGNOSIS — I10 Essential (primary) hypertension: Secondary | ICD-10-CM | POA: Diagnosis present

## 2020-11-11 DIAGNOSIS — E1169 Type 2 diabetes mellitus with other specified complication: Secondary | ICD-10-CM

## 2020-11-11 DIAGNOSIS — Z20822 Contact with and (suspected) exposure to covid-19: Secondary | ICD-10-CM | POA: Diagnosis present

## 2020-11-11 DIAGNOSIS — R933 Abnormal findings on diagnostic imaging of other parts of digestive tract: Secondary | ICD-10-CM

## 2020-11-11 DIAGNOSIS — N179 Acute kidney failure, unspecified: Secondary | ICD-10-CM | POA: Diagnosis not present

## 2020-11-11 DIAGNOSIS — J42 Unspecified chronic bronchitis: Secondary | ICD-10-CM

## 2020-11-11 DIAGNOSIS — IMO0002 Reserved for concepts with insufficient information to code with codable children: Secondary | ICD-10-CM | POA: Diagnosis present

## 2020-11-11 DIAGNOSIS — E785 Hyperlipidemia, unspecified: Secondary | ICD-10-CM | POA: Diagnosis present

## 2020-11-11 DIAGNOSIS — E1110 Type 2 diabetes mellitus with ketoacidosis without coma: Principal | ICD-10-CM | POA: Diagnosis present

## 2020-11-11 DIAGNOSIS — Z7982 Long term (current) use of aspirin: Secondary | ICD-10-CM

## 2020-11-11 DIAGNOSIS — F17210 Nicotine dependence, cigarettes, uncomplicated: Secondary | ICD-10-CM | POA: Diagnosis present

## 2020-11-11 DIAGNOSIS — K920 Hematemesis: Secondary | ICD-10-CM

## 2020-11-11 DIAGNOSIS — Z79899 Other long term (current) drug therapy: Secondary | ICD-10-CM

## 2020-11-11 LAB — CBC
HCT: 43.3 % (ref 39.0–52.0)
Hemoglobin: 14.6 g/dL (ref 13.0–17.0)
MCH: 29.4 pg (ref 26.0–34.0)
MCHC: 33.7 g/dL (ref 30.0–36.0)
MCV: 87.3 fL (ref 80.0–100.0)
Platelets: 257 10*3/uL (ref 150–400)
RBC: 4.96 MIL/uL (ref 4.22–5.81)
RDW: 15 % (ref 11.5–15.5)
WBC: 14.7 10*3/uL — ABNORMAL HIGH (ref 4.0–10.5)
nRBC: 0 % (ref 0.0–0.2)

## 2020-11-11 LAB — URINALYSIS, COMPLETE (UACMP) WITH MICROSCOPIC
Bacteria, UA: NONE SEEN
Bilirubin Urine: NEGATIVE
Glucose, UA: >=500 mg/dL — AB
Hgb urine dipstick: NEGATIVE
Ketones, ur: 20 mg/dL — AB
Leukocytes,Ua: NEGATIVE
Nitrite: NEGATIVE
Protein, ur: NEGATIVE mg/dL
Specific Gravity, Urine: 1.028 (ref 1.005–1.030)
Squamous Epithelial / HPF: NONE SEEN (ref 0–5)
pH: 5 (ref 5.0–8.0)

## 2020-11-11 LAB — GLUCOSE, CAPILLARY: Glucose-Capillary: 128 mg/dL — ABNORMAL HIGH (ref 70–99)

## 2020-11-11 LAB — COMPREHENSIVE METABOLIC PANEL
ALT: 13 U/L (ref 0–44)
AST: 14 U/L — ABNORMAL LOW (ref 15–41)
Albumin: 4.2 g/dL (ref 3.5–5.0)
Alkaline Phosphatase: 81 U/L (ref 38–126)
Anion gap: 20 — ABNORMAL HIGH (ref 5–15)
BUN: 39 mg/dL — ABNORMAL HIGH (ref 8–23)
CO2: 24 mmol/L (ref 22–32)
Calcium: 10.1 mg/dL (ref 8.9–10.3)
Chloride: 90 mmol/L — ABNORMAL LOW (ref 98–111)
Creatinine, Ser: 1.34 mg/dL — ABNORMAL HIGH (ref 0.61–1.24)
GFR, Estimated: 60 mL/min — ABNORMAL LOW (ref >60)
Glucose, Bld: 180 mg/dL — ABNORMAL HIGH (ref 70–99)
Potassium: 4.5 mmol/L (ref 3.5–5.1)
Sodium: 134 mmol/L — ABNORMAL LOW (ref 135–145)
Total Bilirubin: 1.3 mg/dL — ABNORMAL HIGH (ref 0.3–1.2)
Total Protein: 7.9 g/dL (ref 6.5–8.1)

## 2020-11-11 LAB — BETA-HYDROXYBUTYRIC ACID: Beta-Hydroxybutyric Acid: 3.65 mmol/L — ABNORMAL HIGH (ref 0.05–0.27)

## 2020-11-11 LAB — RESP PANEL BY RT-PCR (FLU A&B, COVID) ARPGX2
Influenza A by PCR: NEGATIVE
Influenza B by PCR: NEGATIVE
SARS Coronavirus 2 by RT PCR: NEGATIVE

## 2020-11-11 LAB — CBG MONITORING, ED
Glucose-Capillary: 183 mg/dL — ABNORMAL HIGH (ref 70–99)
Glucose-Capillary: 126 mg/dL — ABNORMAL HIGH (ref 70–99)

## 2020-11-11 LAB — LIPASE, BLOOD: Lipase: 74 U/L — ABNORMAL HIGH (ref 11–51)

## 2020-11-11 MED ORDER — ALUM & MAG HYDROXIDE-SIMETH 200-200-20 MG/5ML PO SUSP
30.0000 mL | Freq: Once | ORAL | Status: AC
  Administered 2020-11-11: 30 mL via ORAL
  Filled 2020-11-11: qty 30

## 2020-11-11 MED ORDER — SODIUM CHLORIDE 0.9 % IV SOLN: INTRAVENOUS | Status: DC

## 2020-11-11 MED ORDER — PANTOPRAZOLE SODIUM 40 MG IV SOLR
40.0000 mg | Freq: Once | INTRAVENOUS | Status: AC
  Administered 2020-11-11: 40 mg via INTRAVENOUS
  Filled 2020-11-11: qty 40

## 2020-11-11 MED ORDER — COLESEVELAM HCL 625 MG PO TABS
1875.0000 mg | ORAL_TABLET | Freq: Two times a day (BID) | ORAL | Status: DC
  Filled 2020-11-11 (×4): qty 3

## 2020-11-11 MED ORDER — TRAZODONE HCL 50 MG PO TABS: 25.0000 mg | ORAL_TABLET | Freq: Every evening | ORAL | Status: DC | PRN

## 2020-11-11 MED ORDER — POTASSIUM CHLORIDE 10 MEQ/100ML IV SOLN
10.0000 meq | INTRAVENOUS | Status: AC
  Administered 2020-11-11: 10 meq via INTRAVENOUS
  Filled 2020-11-11: qty 100

## 2020-11-11 MED ORDER — LACTATED RINGERS IV BOLUS
1000.0000 mL | Freq: Once | INTRAVENOUS | Status: AC
  Administered 2020-11-11: 1000 mL via INTRAVENOUS

## 2020-11-11 MED ORDER — PANTOPRAZOLE SODIUM 40 MG IV SOLR: 40.0000 mg | Freq: Two times a day (BID) | INTRAVENOUS | Status: DC

## 2020-11-11 MED ORDER — TIOTROPIUM BROMIDE MONOHYDRATE 18 MCG IN CAPS
18.0000 ug | ORAL_CAPSULE | Freq: Every day | RESPIRATORY_TRACT | Status: DC
  Filled 2020-11-11: qty 5

## 2020-11-11 MED ORDER — SODIUM CHLORIDE 0.9 % IV BOLUS
1000.0000 mL | Freq: Once | INTRAVENOUS | Status: AC
  Administered 2020-11-11: 1000 mL via INTRAVENOUS

## 2020-11-11 MED ORDER — ONDANSETRON HCL 4 MG PO TABS: 4.0000 mg | ORAL_TABLET | Freq: Four times a day (QID) | ORAL | Status: DC | PRN

## 2020-11-11 MED ORDER — LACTATED RINGERS IV SOLN: INTRAVENOUS | Status: DC

## 2020-11-11 MED ORDER — IOHEXOL 300 MG/ML  SOLN
100.0000 mL | Freq: Once | INTRAMUSCULAR | Status: AC | PRN
  Administered 2020-11-11: 100 mL via INTRAVENOUS

## 2020-11-11 MED ORDER — DEXTROSE IN LACTATED RINGERS 5 % IV SOLN: INTRAVENOUS | Status: DC

## 2020-11-11 MED ORDER — INSULIN REGULAR(HUMAN) IN NACL 100-0.9 UT/100ML-% IV SOLN
INTRAVENOUS | Status: DC
  Filled 2020-11-11: qty 100

## 2020-11-11 MED ORDER — POLYETHYLENE GLYCOL 3350 17 G PO PACK
17.0000 g | PACK | Freq: Every day | ORAL | Status: DC
  Filled 2020-11-11: qty 1

## 2020-11-11 MED ORDER — FLUPHENAZINE HCL 5 MG PO TABS
5.0000 mg | ORAL_TABLET | Freq: Two times a day (BID) | ORAL | Status: DC
Start: 2020-11-11 — End: 2020-11-13
  Filled 2020-11-11 (×6): qty 1

## 2020-11-11 MED ORDER — ALBUTEROL SULFATE HFA 108 (90 BASE) MCG/ACT IN AERS
2.0000 | INHALATION_SPRAY | RESPIRATORY_TRACT | Status: DC | PRN
  Filled 2020-11-11: qty 6.7

## 2020-11-11 MED ORDER — BENAZEPRIL HCL 20 MG PO TABS
20.0000 mg | ORAL_TABLET | Freq: Every day | ORAL | Status: DC
Start: 2020-11-11 — End: 2020-11-13
  Filled 2020-11-11 (×4): qty 1

## 2020-11-11 MED ORDER — SODIUM CHLORIDE 0.9 % IV SOLN
8.0000 mg/h | INTRAVENOUS | Status: DC
  Administered 2020-11-12 – 2020-11-13 (×2): 8 mg/h via INTRAVENOUS
  Filled 2020-11-11 (×3): qty 80

## 2020-11-11 MED ORDER — BUDESON-GLYCOPYRROL-FORMOTEROL 160-9-4.8 MCG/ACT IN AERO: 2.0000 | INHALATION_SPRAY | Freq: Two times a day (BID) | RESPIRATORY_TRACT | Status: DC

## 2020-11-11 MED ORDER — ONDANSETRON 4 MG PO TBDP: 4.0000 mg | ORAL_TABLET | Freq: Three times a day (TID) | ORAL | Status: DC | PRN

## 2020-11-11 MED ORDER — MAGNESIUM HYDROXIDE 400 MG/5ML PO SUSP: 30.0000 mL | Freq: Every day | ORAL | Status: DC | PRN

## 2020-11-11 MED ORDER — INSULIN ASPART 100 UNIT/ML ~~LOC~~ SOLN
0.0000 [IU] | SUBCUTANEOUS | Status: AC
  Administered 2020-11-11 (×2): 3 [IU] via SUBCUTANEOUS
  Filled 2020-11-11 (×2): qty 1

## 2020-11-11 MED ORDER — LINAGLIPTIN 5 MG PO TABS
5.0000 mg | ORAL_TABLET | Freq: Every day | ORAL | Status: DC
  Filled 2020-11-11 (×2): qty 1

## 2020-11-11 MED ORDER — PIOGLITAZONE HCL 30 MG PO TABS
45.0000 mg | ORAL_TABLET | Freq: Every day | ORAL | Status: DC
  Filled 2020-11-11: qty 1

## 2020-11-11 MED ORDER — VITAMIN D (ERGOCALCIFEROL) 1.25 MG (50000 UNIT) PO CAPS: 50000.0000 [IU] | ORAL_CAPSULE | ORAL | Status: DC

## 2020-11-11 MED ORDER — PANTOPRAZOLE SODIUM 40 MG IV SOLR: 40.0000 mg | Freq: Once | INTRAVENOUS | Status: DC

## 2020-11-11 MED ORDER — PRAVASTATIN SODIUM 20 MG PO TABS: 40.0000 mg | ORAL_TABLET | Freq: Every day | ORAL | Status: DC

## 2020-11-11 MED ORDER — AMMONIUM LACTATE 12 % EX LOTN
1.0000 "application " | TOPICAL_LOTION | Freq: Every day | CUTANEOUS | Status: DC
  Filled 2020-11-11: qty 400

## 2020-11-11 MED ORDER — ENOXAPARIN SODIUM 40 MG/0.4ML ~~LOC~~ SOLN: 40.0000 mg | SUBCUTANEOUS | Status: DC

## 2020-11-11 MED ORDER — ACETAMINOPHEN 650 MG RE SUPP: 650.0000 mg | Freq: Four times a day (QID) | RECTAL | Status: DC | PRN

## 2020-11-11 MED ORDER — DIVALPROEX SODIUM ER 500 MG PO TB24
500.0000 mg | ORAL_TABLET | Freq: Two times a day (BID) | ORAL | Status: DC
  Filled 2020-11-11 (×5): qty 1

## 2020-11-11 MED ORDER — ACETAMINOPHEN 325 MG PO TABS: 650.0000 mg | ORAL_TABLET | Freq: Four times a day (QID) | ORAL | Status: DC | PRN

## 2020-11-11 MED ORDER — BUPROPION HCL ER (XL) 150 MG PO TB24
300.0000 mg | ORAL_TABLET | Freq: Every day | ORAL | Status: DC
  Filled 2020-11-11: qty 2

## 2020-11-11 MED ORDER — LINACLOTIDE 145 MCG PO CAPS
145.0000 ug | ORAL_CAPSULE | Freq: Every day | ORAL | Status: DC
  Filled 2020-11-11 (×2): qty 1

## 2020-11-11 MED ORDER — DEXTROSE 50 % IV SOLN: 0.0000 mL | INTRAVENOUS | Status: DC | PRN

## 2020-11-11 MED ORDER — MEMANTINE HCL-DONEPEZIL HCL ER 28-10 MG PO CP24: 1.0000 | ORAL_CAPSULE | Freq: Every day | ORAL | Status: DC

## 2020-11-11 MED ORDER — QUETIAPINE FUMARATE 100 MG PO TABS: 600.0000 mg | ORAL_TABLET | Freq: Every day | ORAL | Status: DC

## 2020-11-11 MED ORDER — BENZTROPINE MESYLATE 1 MG PO TABS
1.0000 mg | ORAL_TABLET | Freq: Two times a day (BID) | ORAL | Status: DC
Start: 2020-11-11 — End: 2020-11-13
  Filled 2020-11-11 (×5): qty 1

## 2020-11-11 MED ORDER — CLONAZEPAM 1 MG PO TABS
1.0000 mg | ORAL_TABLET | Freq: Three times a day (TID) | ORAL | Status: DC
  Filled 2020-11-11: qty 1

## 2020-11-11 MED ORDER — TRAZODONE HCL 100 MG PO TABS: 200.0000 mg | ORAL_TABLET | Freq: Every day | ORAL | Status: DC

## 2020-11-11 MED ORDER — ONDANSETRON HCL 4 MG/2ML IJ SOLN: 4.0000 mg | Freq: Four times a day (QID) | INTRAMUSCULAR | Status: DC | PRN

## 2020-11-11 NOTE — ED Triage Notes (Signed)
Pt comes into the ED via POV  With his caregiver c/o emesis and possible dehydration.  Pt has been unable to keep food and water down at home and patient is diabetic.  Pt currently ambulatory to triage and in NAD with even and unlabored respirations.

## 2020-11-11 NOTE — ED Notes (Signed)
Admit MD at bedside

## 2020-11-11 NOTE — ED Provider Notes (Signed)
[facility] Emergency Department Provider Note  ____________________________________________  Time seen: Approximately 9:35 PM  I have reviewed the triage vital signs and the nursing notes.   HISTORY  Chief Complaint Emesis and Dehydration    Level 5 Caveat: Portions of the History and Physical including HPI and review of systems are unable to be completely obtained due to patient being a poor historian   HPI Johnny [name] is a 65 y.o. male with a history of diabetes, hypertension, schizophrenia who comes ED complaining of vomiting for the last 2 days.  Not eating or drinking much.   There was recently a viral illness spreading throughout his group home.  Patient also complains of burning pain in the center of the chest and upper abdomen.     Past Medical History:  Diagnosis Date  . Anemia   . Clostridium difficile colitis   . Diabetes mellitus without complication (HCC)   . DM (diabetes mellitus) (HCC)   . GERD (gastroesophageal reflux disease)   . Herpes   . Hyperlipidemia   . Hypertension   . Schizophrenia [facility](HCC)      Patient Active Problem List   Diagnosis Date Noted  . Uncontrolled type 2 diabetes mellitus (HCC) 11/11/2020  . Bacteremia 06/17/2020  . Acute renal failure (ARF) (HCC) 07/05/2015  . SINUS TACHYCARDIA 06/10/2008  . HYPOTENSION, ORTHOSTATIC 06/10/2008  . Type 2 diabetes mellitus with hyperlipidemia (HCC) 05/19/2008  . HYPOGLYCEMIA 04/27/2008  . HYPERLIPIDEMIA 01/22/2008  . Schizophrenia (HCC) 01/22/2008  . ANXIETY 01/22/2008  . DEPRESSION 01/22/2008  . Essential hypertension 01/22/2008  . MYOCARDIAL INFARCTION, HX OF 01/22/2008  . BRONCHITIS, CHRONIC 01/22/2008  . GERD 01/22/2008     Past Surgical History:  Procedure Laterality Date  . CHOLECYSTECTOMY       Prior to Admission medications   Medication Sig Start Date End Date Taking? Authorizing Provider  albuterol (PROVENTIL HFA;VENTOLIN HFA) 108 (90 BASE)  MCG/ACT inhaler Inhale 2 puffs into the lungs every 4 (four) hours as needed for wheezing or shortness of breath.    [provider]  ammonium lactate (AMLACTIN) 12 % cream Apply 1 g topically daily.     [provider]  aspirin 81 MG chewable tablet Chew 81 mg by mouth daily.     [provider]  benazepril (LOTENSIN) 20 MG tablet Take 20 mg by mouth daily. 06/11/20   [provider]  benztropine (COGENTIN) 1 MG tablet Take 1 mg by mouth 2 (two) times daily.    [provider]  BREZTRI AEROSPHERE 160-9-4.8 MCG/ACT AERO Inhale 2 puffs into the lungs 2 (two) times daily. 05/14/20   [provider]  buPROPion (WELLBUTRIN XL) 300 MG 24 hr tablet Take 300 mg by mouth daily. 06/11/20   [provider]  Canagliflozin-Metformin HCl (INVOKAMET) 50-1000 MG TABS Take 1 tablet by mouth 2 (two) times daily with a meal.    [provider]  clonazePAM (KLONOPIN) 1 MG tablet Take 1 mg by mouth 3 (three) times daily.    [provider]  colesevelam (WELCHOL) 625 MG tablet Take 1,875 mg by mouth 2 (two) times daily.     [provider]  divalproex (DEPAKOTE ER) 500 MG 24 hr tablet Take 500 mg by mouth 2 (two) times daily.     [provider]  fluPHENAZine (PROLIXIN) 5 MG tablet Take 5 mg by mouth 2 (two) times daily. Pt also takes daily as needed for psychosis/agitation.    [provider]  hydrochlorothiazide (MICROZIDE) 12.5 MG capsule Take 12.5 mg by mouth daily. 06/11/20   [provider]  insulin glargine (LANTUS) 100 UNIT/ML injection Inject 30 Units into the skin at bedtime.    [provider]  INVEGA TRINZA 819 MG/2.625ML injection Inject 819 mg into the muscle every 30 (thirty) days. 06/01/20   [provider]  LINZESS 145 MCG CAPS capsule Take 145 mcg by mouth daily. 06/11/20   [provider]  Memantine HCl-Donepezil HCl (NAMZARIC) 28-10 MG CP24 Take 1 capsule by mouth at  bedtime.     [provider]  omeprazole (PRILOSEC) 40 MG capsule Take 40 mg by mouth daily. 06/11/20   [provider]  ondansetron (ZOFRAN ODT) 4 MG disintegrating tablet Take 1 tablet (4 mg total) by mouth every 8 (eight) hours as needed for nausea or vomiting. 11/10/20   Merwyn Katos, MD  pioglitazone (ACTOS) 45 MG tablet Take 45 mg by mouth daily.    [provider]  polyethylene glycol (MIRALAX / GLYCOLAX) packet Take 17 g by mouth daily.    [provider]  pravastatin (PRAVACHOL) 40 MG tablet Take 40 mg by mouth at bedtime.     [provider]  QUEtiapine (SEROQUEL) 400 MG tablet Take 600 mg by mouth at bedtime.    [provider]  sitaGLIPtin (JANUVIA) 100 MG tablet Take 100 mg by mouth daily.    [provider]  SYNJARDY 12.01-999 MG TABS Take 1 tablet by mouth 2 (two) times daily. 06/11/20   [provider]  tiotropium (SPIRIVA) 18 MCG inhalation capsule Place 18 mcg into inhaler and inhale daily.    [provider]  traZODone (DESYREL) 100 MG tablet Take 200 mg by mouth at bedtime.    [provider]  Vitamin D, Ergocalciferol, (DRISDOL) 50000 UNITS CAPS capsule Take 50,000 Units by mouth every 7 (seven) days.    [provider]     Allergies Fish allergy and Shellfish allergy   Family History  Problem Relation Age of Onset  . Diabetes Mother   . Kidney failure Mother   . Diabetes Father   . CAD Brother     Social History Social History   Tobacco Use  . Smoking status: Current Every Day Smoker    Packs/day: 1.00    Types: Cigarettes  . Smokeless tobacco: Never Used  Substance Use Topics  . Alcohol use: No  . Drug use: No    Review of Systems Level 5 Caveat: Portions of the History and Physical including HPI and review of systems are unable to be completely obtained due to patient being a poor historian   Constitutional:   No known fever.  ENT:   No  rhinorrhea. Cardiovascular:   No chest pain or syncope. Respiratory:   No dyspnea or cough. Gastrointestinal: Positive epigastric pain and vomiting. Musculoskeletal:   Negative for focal pain or swelling ____________________________________________   PHYSICAL EXAM:  VITAL SIGNS: ED Triage Vitals  Enc Vitals Group     BP 11/11/20 1634 140/88     Pulse Rate 11/11/20 1634 (!) 120     Resp 11/11/20 1634 19     Temp 11/11/20 1634 98.9 F (37.2 C)     Temp Source 11/11/20 1634 Oral     SpO2 11/11/20 1634 98 %     Weight 11/11/20 1635 158 lb 11.7 oz (72 kg)     Height 11/11/20 1635 5\' 11"  (1.803 m)     Head Circumference --  Peak Flow --      Pain Score 11/11/20 1635 10     Pain Loc --      Pain Edu? --      Excl. in GC? --     Vital signs reviewed, nursing assessments reviewed.   Constitutional:   Alert and oriented. Non-toxic appearance. Eyes:   Conjunctivae are normal. EOMI. PERRL. ENT      Head:   Normocephalic and atraumatic.      Nose:   No congestion/rhinnorhea.       Mouth/Throat: Dry mucous membranes      Neck:   No meningismus. Full ROM. Hematological/Lymphatic/Immunilogical:   No cervical lymphadenopathy. Cardiovascular:   Tachycardia heart rate 120. Symmetric bilateral radial and DP pulses.  No murmurs. Cap refill less than 2 seconds. Respiratory:   Normal respiratory effort without tachypnea/retractions. Breath sounds are clear and equal bilaterally. No wheezes/rales/rhonchi. Gastrointestinal:   Soft with mild upper abdominal tenderness. Non distended.  No rebound, rigidity, or guarding.  Musculoskeletal:   Normal range of motion in all extremities. No joint effusions.  No lower extremity tenderness.  No edema. Neurologic:   Normal speech and language.  Motor grossly intact. No acute focal neurologic deficits are appreciated.  Skin:    Skin is warm, dry and intact. No rash noted.  No petechiae, purpura, or  bullae.  ____________________________________________    LABS (pertinent positives/negatives) (all labs ordered are listed, but only abnormal results are displayed) Labs Reviewed  LIPASE, BLOOD - Abnormal; Notable for the following components:      Result Value   Lipase 74 (*)    All other components within normal limits  COMPREHENSIVE METABOLIC PANEL - Abnormal; Notable for the following components:   Sodium 134 (*)    Chloride 90 (*)    Glucose, Bld 180 (*)    BUN 39 (*)    Creatinine, Ser 1.34 (*)    AST 14 (*)    Total Bilirubin 1.3 (*)    GFR, Estimated 60 (*)    Anion gap 20 (*)    All other components within normal limits  CBC - Abnormal; Notable for the following components:   WBC 14.7 (*)    All other components within normal limits  URINALYSIS, COMPLETE (UACMP) WITH MICROSCOPIC - Abnormal; Notable for the following components:   Color, Urine YELLOW (*)    APPearance CLEAR (*)    Glucose, UA >=500 (*)    Ketones, ur 20 (*)    All other components within normal limits  CBG MONITORING, ED - Abnormal; Notable for the following components:   Glucose-Capillary 183 (*)    All other components within normal limits  RESP PANEL BY RT-PCR (FLU A&B, COVID) ARPGX2  BETA-HYDROXYBUTYRIC ACID   ____________________________________________   EKG By me Normal sinus rhythm rate of 97, normal axis and intervals.  Poor R wave progression.  Normal ST segments and T waves.  No ischemic changes   ____________________________________________    RADIOLOGY  CT ABDOMEN PELVIS W CONTRAST  Result Date: 11/11/2020 CLINICAL DATA:  Vomiting. EXAM: CT ABDOMEN AND PELVIS WITH CONTRAST TECHNIQUE: Multidetector CT imaging of the abdomen and pelvis was performed using the standard protocol following bolus administration of intravenous contrast. CONTRAST:  OMNIPAQUE IOHEXOL 300 MG/ML  SOLN COMPARISON:  November 15, 2010 FINDINGS: Lower chest: Moderate severity atelectasis and/or infiltrate  is seen within the right lung base. There is a small right pleural effusion. A thin (approximately 2 mm thick) linear area of air  attenuation is seen along the medial aspect of the right lung base (axial CT images 4 through 7, CT series number 4). Hepatobiliary: There is diffuse fatty infiltration of the liver parenchyma. No focal liver abnormality is seen. Status post cholecystectomy. No biliary dilatation. Pancreas: Unremarkable. No pancreatic ductal dilatation or surrounding inflammatory changes. Spleen: Normal in size without focal abnormality. Adrenals/Urinary Tract: A stable 3.3 cm x 2.2 cm heterogeneous left adrenal mass is noted. A stable 1.4 cm x 0.6 cm low-attenuation right adrenal mass is also seen. Kidneys are normal, without renal calculi or hydronephrosis. 1.0 cm and 0.7 cm cysts are seen within the lateral aspect of the mid right kidney. A 2.0 cm x 1.6 cm simple cyst is also noted within the posterolateral aspect of the mid left kidney. Bladder is unremarkable. Stomach/Bowel: There is moderate to marked severity thickening of the visualized portion of the distal esophagus. Stomach is within normal limits. Appendix appears normal. A large amount of stool is seen throughout the colon. No evidence of bowel wall thickening, distention, or inflammatory changes. Vascular/Lymphatic: Aortic atherosclerosis. No enlarged abdominal or pelvic lymph nodes. Reproductive: Prostate is unremarkable. Other: No abdominal wall hernia or abnormality. No abdominopelvic ascites or intra-abdominal free air. Musculoskeletal: No acute or significant osseous findings. IMPRESSION: 1. Findings worrisome for the presence of a small pneumothorax along the posteromedial aspect of the right lung base. Correlation with chest CT is recommended. 2. Moderate severity right basilar atelectasis and/or infiltrate with a small right pleural effusion. 3. Findings which may represent moderate to marked severity esophagitis within the  visualized portion of the distal esophagus. Sequelae associated with an underlying neoplastic process cannot be excluded. 4. Evidence of prior cholecystectomy. 5. Findings likely consistent with stable bilateral adrenal adenomas. 6. Aortic atherosclerosis. Aortic Atherosclerosis (ICD10-I70.0). Electronically Signed   By: Aram Candela M.D.   On: 11/11/2020 19:26    ____________________________________________   PROCEDURES Procedures  ____________________________________________  DIFFERENTIAL DIAGNOSIS   Gastritis, pancreatitis, bowel obstruction, diverticulitis, intra-abdominal abscess, hernia, electrolyte abnormality, dehydration  CLINICAL IMPRESSION / ASSESSMENT AND PLAN / ED COURSE  Medications ordered in the ED: Medications  alum & mag hydroxide-simeth (MAALOX/MYLANTA) 200-200-20 MG/5ML suspension 30 mL (has no administration in time range)  pantoprazole (PROTONIX) injection 40 mg (has no administration in time range)  insulin regular, human (MYXREDLIN) 100 units/ 100 mL infusion (has no administration in time range)  lactated ringers infusion (has no administration in time range)  dextrose 5 % in lactated ringers infusion (has no administration in time range)  dextrose 50 % solution 0-50 mL (has no administration in time range)  potassium chloride 10 mEq in 100 mL IVPB (has no administration in time range)  lactated ringers bolus 1,000 mL (1,000 mLs Intravenous New Bag/Given 11/11/20 1902)  iohexol (OMNIPAQUE) 300 MG/ML solution 100 mL (100 mLs Intravenous Contrast Given 11/11/20 1840)    Pertinent labs & imaging results that were available during my care of the patient were reviewed by me and considered in my medical decision making (see chart for details).   Johnny [name] was evaluated in Emergency Department on 11/11/2020 for the symptoms described in the history of present illness. He was evaluated in the context of the global COVID-19 pandemic, which necessitated  consideration that the patient might be at risk for infection with the SARS-CoV-2 virus that causes COVID-19. Institutional protocols and algorithms that pertain to the evaluation of patients at risk for COVID-19 are in a state of rapid change based on information released by  regulatory bodies including the CDC and federal and state organizations. These policies and algorithms were followed during the patient's care in the ED.   Patient presents with abdominal pain and vomiting.  Seen for same yesterday, discharged back to group home but returned again today with persistent symptoms.  Labs show ketosis, elevated anion gap with elevated BUN/creatinine ratio.  Consistent with dehydration, hyperchloremic metabolic acidosis from vomiting, possibly DKA.  We will add on beta hydroxybutyrate, Covid testing.   CT abdomen pelvis discussed with radiology who notes that the abdomen has no specific findings, but lower chest concerning for possible pneumothorax versus pneumomediastinum with the vomiting.  Recommended to proceed with CT scan of the chest.  Clinical Course as of 11/11/20 2144  Thu Nov 11, 2020  2122 CT chest:  IMPRESSION: 1. Stable moderate severity right basilar scarring and/or rounded atelectasis with chronic changes within the posterior medial aspect of the right lung base. 2. Esophageal wall thickening which may represent moderate to marked severity esophagitis. Sequelae associated with an underlying neoplasm cannot be excluded. 3. Small left pleural effusion. 4. Bilateral adrenal adenomas.  Emphysema (ICD10-J43.9). [PS]    Clinical Course User Index [PS] Sharman Cheek, MD     ----------------------------------------- 9:44 PM on 11/11/2020 -----------------------------------------  CT chest unremarkable.  Vital signs improved with IV fluids.  Given persistent symptoms, discussed with hospitalist for further evaluation.  ____________________________________________   FINAL  CLINICAL IMPRESSION(S) / ED DIAGNOSES    Final diagnoses:  Schizophrenia, unspecified type (HCC)  Chronic bronchitis, unspecified chronic bronchitis type (HCC)  Type 2 diabetes mellitus with other specified complication, with long-term current use of insulin (HCC)  Diabetic ketoacidosis without coma associated with type 2 diabetes mellitus [facility])     ED Discharge Orders    None      Portions of this note were generated with dragon dictation software. Dictation errors may occur despite best attempts at proofreading.   Sharman Cheek, MD 11/11/20 2155

## 2020-11-11 NOTE — H&P (Signed)
[place]   PATIENT NAME: Johnny [name]    MR#:  [id number]  DATE OF BIRTH:  07/18/1956  DATE OF ADMISSION:  11/11/2020  PRIMARY CARE PHYSICIAN: Johnny Bend, NP   Patient is coming from: Group home.  REQUESTING/REFERRING PHYSICIAN: Alfonse Flavors, MD  CHIEF COMPLAINT:   Chief Complaint  Patient presents with  . Emesis  . Dehydration    HISTORY OF PRESENT ILLNESS:  Johnny [name] is a 65 y.o. African-American male with medical history significant for hypertension, dyslipidemia, type 2 diabetes mellitus, GERD and schizophrenia, who presented to the emergency room with acute onset of intractable vomiting over the last couple of days with poor p.o. intake.  This is associated with burning epigastric and substernal pain.  No diarrhea or melena or bright red blood per rectum.  He admitted however to coffee-ground emesis.  No dysuria, oliguria or hematuria or flank pain.  No polyuria and polydipsia.  No dyspnea or cough or wheezing.  No other bleeding diathesis.  No fever or chills.  The patient was seen yesterday in the emergency room here with similar symptoms with atypical chest pain and dyspnea, managed with IV hydration and felt significantly better and discharged.  ED Course: Upon presentation to the emergency room, heart rate was 120 with otherwise normal vital signs.  Has revealed mild hyponatremia with a sodium of 134 and chloride of 90.  Blood glucose was 180.  BUN was 39 and creatinine 1.34 compared with a BUN of 34 and creatinine 1.17 yesterday glucose of 157.  CBC showed leukocytosis with WBC of 14.7 compared to 12 yesterday.  Hemoglobin and hematocrit were 14.6/43.3.  UA showed more than 500 glucose today and was otherwise unremarkable.  COVID-19 PCR and influenza antigens came back negative.  EKG as reviewed by me : This was ordered and is currently pending. Imaging: Abdominal and pelvic CT scan revealed: 1. Findings worrisome for the presence of a small  pneumothorax along the posteromedial aspect of the right lung base. Correlation with chest CT is recommended. 2. Moderate severity right basilar atelectasis and/or infiltrate with a small right pleural effusion. 3. Findings which may represent moderate to marked severity esophagitis within the visualized portion of the distal esophagus. Sequelae associated with an underlying neoplastic process cannot be excluded. 4. Evidence of prior cholecystectomy. 5. Findings likely consistent with stable bilateral adrenal adenomas. 6. Aortic atherosclerosis.  Chest CTA without contrast is currently pending.  The patient was given 1 L bolus of IV lactated Ringer and was ordered 40 mg of IV Protonix.  He was initially ordered IV insulin drip. PAST MEDICAL HISTORY:   Past Medical History:  Diagnosis Date  . Anemia   . Clostridium difficile colitis   . Diabetes mellitus without complication (HCC)   . DM (diabetes mellitus) (HCC)   . GERD (gastroesophageal reflux disease)   . Herpes   . Hyperlipidemia   . Hypertension   . Schizophrenia (HCC)     PAST SURGICAL HISTORY:   Past Surgical History:  Procedure Laterality Date  . CHOLECYSTECTOMY      SOCIAL HISTORY:   Social History   Tobacco Use  . Smoking status: Current Every Day Smoker    Packs/day: 1.00    Types: Cigarettes  . Smokeless tobacco: Never Used  Substance Use Topics  . Alcohol use: No    FAMILY HISTORY:   Family History  Problem Relation Age of Onset  . Diabetes Mother   . Kidney failure Mother   .  Diabetes Father   . CAD Brother     DRUG ALLERGIES:   Allergies  Allergen Reactions  . Fish Allergy Rash  . Shellfish Allergy Rash    REVIEW OF SYSTEMS:   ROS As per history of present illness. All pertinent systems were reviewed above. Constitutional, HEENT, cardiovascular, respiratory, GI, GU, musculoskeletal, neuro, psychiatric, endocrine, integumentary and hematologic systems were reviewed and are  otherwise negative/unremarkable except for positive findings mentioned above in the HPI.   MEDICATIONS AT HOME:   Prior to Admission medications   Medication Sig Start Date End Date Taking? Authorizing Provider  albuterol (PROVENTIL HFA;VENTOLIN HFA) 108 (90 BASE) MCG/ACT inhaler Inhale 2 puffs into the lungs every 4 (four) hours as needed for wheezing or shortness of breath.    [provider]  ammonium lactate (AMLACTIN) 12 % cream Apply 1 g topically daily.     [provider]  aspirin 81 MG chewable tablet Chew 81 mg by mouth daily.     [provider]  benazepril (LOTENSIN) 20 MG tablet Take 20 mg by mouth daily. 06/11/20   [provider]  benztropine (COGENTIN) 1 MG tablet Take 1 mg by mouth 2 (two) times daily.    [provider]  BREZTRI AEROSPHERE 160-9-4.8 MCG/ACT AERO Inhale 2 puffs into the lungs 2 (two) times daily. 05/14/20   [provider]  buPROPion (WELLBUTRIN XL) 300 MG 24 hr tablet Take 300 mg by mouth daily. 06/11/20   [provider]  Canagliflozin-Metformin HCl (INVOKAMET) 50-1000 MG TABS Take 1 tablet by mouth 2 (two) times daily with a meal.    [provider]  clonazePAM (KLONOPIN) 1 MG tablet Take 1 mg by mouth 3 (three) times daily.    [provider]  colesevelam (WELCHOL) 625 MG tablet Take 1,875 mg by mouth 2 (two) times daily.     [provider]  divalproex (DEPAKOTE ER) 500 MG 24 hr tablet Take 500 mg by mouth 2 (two) times daily.     [provider]  fluPHENAZine (PROLIXIN) 5 MG tablet Take 5 mg by mouth 2 (two) times daily. Pt also takes daily as needed for psychosis/agitation.    [provider]  hydrochlorothiazide (MICROZIDE) 12.5 MG capsule Take 12.5 mg by mouth daily. 06/11/20   [provider]  insulin glargine (LANTUS) 100 UNIT/ML injection Inject 30 Units into the skin at bedtime.    [provider]  INVEGA TRINZA 819 MG/2.625ML  injection Inject 819 mg into the muscle every 30 (thirty) days. 06/01/20   [provider]  LINZESS 145 MCG CAPS capsule Take 145 mcg by mouth daily. 06/11/20   [provider]  Memantine HCl-Donepezil HCl (NAMZARIC) 28-10 MG CP24 Take 1 capsule by mouth at bedtime.     [provider]  omeprazole (PRILOSEC) 40 MG capsule Take 40 mg by mouth daily. 06/11/20   [provider]  ondansetron (ZOFRAN ODT) 4 MG disintegrating tablet Take 1 tablet (4 mg total) by mouth every 8 (eight) hours as needed for nausea or vomiting. 11/10/20   Merwyn KatosBradler, Evan K, MD  pioglitazone (ACTOS) 45 MG tablet Take 45 mg by mouth daily.    [provider]  polyethylene glycol (MIRALAX / GLYCOLAX) packet Take 17 g by mouth daily.    [provider]  pravastatin (PRAVACHOL) 40 MG tablet Take 40 mg by mouth at bedtime.     [provider]  QUEtiapine (SEROQUEL) 400 MG tablet Take 600 mg by mouth at  bedtime.    [provider]  sitaGLIPtin (JANUVIA) 100 MG tablet Take 100 mg by mouth daily.    [provider]  SYNJARDY 12.01-999 MG TABS Take 1 tablet by mouth 2 (two) times daily. 06/11/20   [provider]  tiotropium (SPIRIVA) 18 MCG inhalation capsule Place 18 mcg into inhaler and inhale daily.    [provider]  traZODone (DESYREL) 100 MG tablet Take 200 mg by mouth at bedtime.    [provider]  Vitamin D, Ergocalciferol, (DRISDOL) 50000 UNITS CAPS capsule Take 50,000 Units by mouth every 7 (seven) days.    [provider]      VITAL SIGNS:  Blood pressure (!) 144/70, pulse 93, temperature 98.9 F (37.2 C), temperature source Oral, resp. rate 16, height 5\' 11"  (1.803 m), weight 72 kg, SpO2 97 %.  PHYSICAL EXAMINATION:  Physical Exam  GENERAL:  65 y.o.-year-old African-American male patient lying in the bed with no acute distress.  EYES: Pupils equal, round, reactive to light and accommodation. No scleral  icterus. Extraocular muscles intact.  HEENT: Head atraumatic, normocephalic. Oropharynx and nasopharynx clear.  NECK:  Supple, no jugular venous distention. No thyroid enlargement, no tenderness.  LUNGS: Normal breath sounds bilaterally, no wheezing, rales,rhonchi or crepitation. No use of accessory muscles of respiration.  CARDIOVASCULAR: Regular rate and rhythm, S1, S2 normal. No murmurs, rubs, or gallops.  ABDOMEN: Soft, nondistended, with mild epigastric tenderness without rebound tenderness guarding or rigidity.. Bowel sounds present. No organomegaly or mass.  EXTREMITIES: No pedal edema, cyanosis, or clubbing.  NEUROLOGIC: Cranial nerves II through XII are intact. Muscle strength 5/5 in all extremities. Sensation intact. Gait not checked.  PSYCHIATRIC: The patient is alert and oriented x 3.  Normal affect and good eye contact. SKIN: No obvious rash, lesion, or ulcer.   LABORATORY PANEL:   CBC Recent Labs  Lab 11/11/20 1637  WBC 14.7*  HGB 14.6  HCT 43.3  PLT 257   ------------------------------------------------------------------------------------------------------------------  Chemistries  Recent Labs  Lab 11/11/20 1637  NA 134*  K 4.5  CL 90*  CO2 24  GLUCOSE 180*  BUN 39*  CREATININE 1.34*  CALCIUM 10.1  AST 14*  ALT 13  ALKPHOS 81  BILITOT 1.3*   ------------------------------------------------------------------------------------------------------------------  Cardiac Enzymes No results for input(s): TROPONINI in the last 168 hours. ------------------------------------------------------------------------------------------------------------------  RADIOLOGY:  DG Chest 2 View  Result Date: 11/10/2020 CLINICAL DATA:  Midsternal chest pain and shortness of breath, belching EXAM: CHEST - 2 VIEW COMPARISON:  06/17/2020 FINDINGS: Frontal and lateral views of the chest demonstrate a stable cardiac silhouette. Chronic areas of scarring and atelectasis are seen,  greatest at the right lung base. Trace right pleural effusion versus pleural thickening again noted, stable. No acute airspace disease. No pneumothorax. No acute bony abnormalities. IMPRESSION: 1. Chronic areas of scarring and atelectasis, greatest at the right lung base. 2. Trace right pleural effusion versus pleural thickening, stable. 3. No acute intrathoracic process. Electronically Signed   By: 08/17/2020 M.D.   On: 11/10/2020 18:03   CT ABDOMEN PELVIS W CONTRAST  Result Date: 11/11/2020 CLINICAL DATA:  Vomiting. EXAM: CT ABDOMEN AND PELVIS WITH CONTRAST TECHNIQUE: Multidetector CT imaging of the abdomen and pelvis was performed using the standard protocol following bolus administration of intravenous contrast. CONTRAST:  01/11/2021 OMNIPAQUE IOHEXOL 300 MG/ML  SOLN COMPARISON:  November 15, 2010 FINDINGS: Lower chest: Moderate severity atelectasis and/or infiltrate is seen within the right lung base. There is a small right pleural effusion.  A thin (approximately 2 mm thick) linear area of air attenuation is seen along the medial aspect of the right lung base (axial CT images 4 through 7, CT series number 4). Hepatobiliary: There is diffuse fatty infiltration of the liver parenchyma. No focal liver abnormality is seen. Status post cholecystectomy. No biliary dilatation. Pancreas: Unremarkable. No pancreatic ductal dilatation or surrounding inflammatory changes. Spleen: Normal in size without focal abnormality. Adrenals/Urinary Tract: A stable 3.3 cm x 2.2 cm heterogeneous left adrenal mass is noted. A stable 1.4 cm x 0.6 cm low-attenuation right adrenal mass is also seen. Kidneys are normal, without renal calculi or hydronephrosis. 1.0 cm and 0.7 cm cysts are seen within the lateral aspect of the mid right kidney. A 2.0 cm x 1.6 cm simple cyst is also noted within the posterolateral aspect of the mid left kidney. Bladder is unremarkable. Stomach/Bowel: There is moderate to marked severity thickening of the  visualized portion of the distal esophagus. Stomach is within normal limits. Appendix appears normal. A large amount of stool is seen throughout the colon. No evidence of bowel wall thickening, distention, or inflammatory changes. Vascular/Lymphatic: Aortic atherosclerosis. No enlarged abdominal or pelvic lymph nodes. Reproductive: Prostate is unremarkable. Other: No abdominal wall hernia or abnormality. No abdominopelvic ascites or intra-abdominal free air. Musculoskeletal: No acute or significant osseous findings. IMPRESSION: 1. Findings worrisome for the presence of a small pneumothorax along the posteromedial aspect of the right lung base. Correlation with chest CT is recommended. 2. Moderate severity right basilar atelectasis and/or infiltrate with a small right pleural effusion. 3. Findings which may represent moderate to marked severity esophagitis within the visualized portion of the distal esophagus. Sequelae associated with an underlying neoplastic process cannot be excluded. 4. Evidence of prior cholecystectomy. 5. Findings likely consistent with stable bilateral adrenal adenomas. 6. Aortic atherosclerosis. Aortic Atherosclerosis (ICD10-I70.0). Electronically Signed   By: Aram Candela M.D.   On: 11/11/2020 19:26      IMPRESSION AND PLAN:  Active Problems:   Uncontrolled type 2 diabetes mellitus (HCC)  1.  Uncontrolled type 2 diabetes mellitus with ketotic nonacidotic hyperglycemia. -The patient will be admitted to a medical monitored observation bed. -We will keep him n.p.o. for now. -We will continue hydration with a liter bolus of IV normal saline followed by 150 mill per hour. -He will be placed on resistant subcutaneous NovoLog and with improved p.o. intake will resume his basal coverage with Lantus. -We will also hold off his oral antidiabetics for now. -We will follow serial BMPs.  2.  Intractable nausea and vomiting like secondary to acute esophagitis leading to #1 with  possible mild upper GI bleeding. -The patient will be placed on IV PPI therapy with IV Protonix bolus followed by drip. -Chest CT without contrast was order and the report is currently pending for further assessment of his esophagus. -GI consultation will be obtained. -I notified Dr. Allegra Lai about the patient.  3.  Acute kidney injury likely prerenal secondary to recurrent nausea and vomiting and dehydration. -The patient will be hydrated with IV normal saline. -We will hold off nephrotoxins  4.  Essential hypertension. -We will place the patient on as needed IV labetalol. -We will hold off Zestril, chlorthalidone and HCTZ given acute kidney injury.  5.  Dyslipidemia. -We will continue statin therapy and will call.  6.  Schizophrenia, anxiety and depression. -We will continue his fluphenazine, Klonopin, Seroquel and Wellbutrin XL as well as Cogentin.   DVT prophylaxis: Lovenox. Code Status: full code. Family  Communication:  The plan of care was discussed in details with the patient (and family). I answered all questions. The patient agreed to proceed with the above mentioned plan. Further management will depend upon hospital course. Disposition Plan: Back to previous home environment Consults called: none. All the records are reviewed and case discussed with ED provider.  Status is: Observation  The patient remains OBS appropriate and will d/c before 2 midnights.  Dispo: The patient is from: Home              Anticipated d/c is to: Home              Patient currently is not medically stable to d/c.   Difficult to place patient No   TOTAL TIME TAKING CARE OF THIS PATIENT:55 minutes.    Hannah Beat M.D on 11/11/2020 at 10:04 PM  [facility]   From 7 PM-7 AM, contact night-coverage [email or web address]  CC: Primary care physician; Johnny Bend, NP

## 2020-11-12 ENCOUNTER — Observation Stay: Payer: Medicare Other | Admitting: Registered Nurse

## 2020-11-12 ENCOUNTER — Encounter
Admission: EM | Disposition: A | Discharge status: Home / Self Care | Payer: Self-pay | Source: Home / Self Care | Attending: Internal Medicine

## 2020-11-12 DIAGNOSIS — R112 Nausea with vomiting, unspecified: Secondary | ICD-10-CM

## 2020-11-12 DIAGNOSIS — F209 Schizophrenia, unspecified: Secondary | ICD-10-CM | POA: Diagnosis present

## 2020-11-12 DIAGNOSIS — R0602 Shortness of breath: Secondary | ICD-10-CM | POA: Diagnosis present

## 2020-11-12 DIAGNOSIS — E785 Hyperlipidemia, unspecified: Secondary | ICD-10-CM | POA: Diagnosis present

## 2020-11-12 DIAGNOSIS — E1165 Type 2 diabetes mellitus with hyperglycemia: Secondary | ICD-10-CM | POA: Diagnosis not present

## 2020-11-12 DIAGNOSIS — Z7951 Long term (current) use of inhaled steroids: Secondary | ICD-10-CM | POA: Diagnosis not present

## 2020-11-12 DIAGNOSIS — I10 Essential (primary) hypertension: Secondary | ICD-10-CM | POA: Diagnosis present

## 2020-11-12 DIAGNOSIS — K209 Esophagitis, unspecified without bleeding: Secondary | ICD-10-CM | POA: Diagnosis not present

## 2020-11-12 DIAGNOSIS — R110 Nausea: Secondary | ICD-10-CM | POA: Diagnosis not present

## 2020-11-12 DIAGNOSIS — Z79899 Other long term (current) drug therapy: Secondary | ICD-10-CM | POA: Diagnosis not present

## 2020-11-12 DIAGNOSIS — F17210 Nicotine dependence, cigarettes, uncomplicated: Secondary | ICD-10-CM | POA: Diagnosis present

## 2020-11-12 DIAGNOSIS — R1111 Vomiting without nausea: Secondary | ICD-10-CM | POA: Diagnosis not present

## 2020-11-12 DIAGNOSIS — K920 Hematemesis: Secondary | ICD-10-CM | POA: Diagnosis not present

## 2020-11-12 DIAGNOSIS — Z9049 Acquired absence of other specified parts of digestive tract: Secondary | ICD-10-CM | POA: Diagnosis not present

## 2020-11-12 DIAGNOSIS — Z91013 Allergy to seafood: Secondary | ICD-10-CM | POA: Diagnosis not present

## 2020-11-12 DIAGNOSIS — E860 Dehydration: Secondary | ICD-10-CM | POA: Diagnosis present

## 2020-11-12 DIAGNOSIS — K2901 Acute gastritis with bleeding: Secondary | ICD-10-CM | POA: Diagnosis not present

## 2020-11-12 DIAGNOSIS — R1110 Vomiting, unspecified: Secondary | ICD-10-CM

## 2020-11-12 DIAGNOSIS — K2970 Gastritis, unspecified, without bleeding: Secondary | ICD-10-CM | POA: Diagnosis present

## 2020-11-12 DIAGNOSIS — Z833 Family history of diabetes mellitus: Secondary | ICD-10-CM | POA: Diagnosis not present

## 2020-11-12 DIAGNOSIS — F419 Anxiety disorder, unspecified: Secondary | ICD-10-CM | POA: Diagnosis present

## 2020-11-12 DIAGNOSIS — K922 Gastrointestinal hemorrhage, unspecified: Secondary | ICD-10-CM | POA: Diagnosis present

## 2020-11-12 DIAGNOSIS — Z20822 Contact with and (suspected) exposure to covid-19: Secondary | ICD-10-CM | POA: Diagnosis present

## 2020-11-12 DIAGNOSIS — Z794 Long term (current) use of insulin: Secondary | ICD-10-CM | POA: Diagnosis not present

## 2020-11-12 DIAGNOSIS — N179 Acute kidney failure, unspecified: Secondary | ICD-10-CM | POA: Diagnosis present

## 2020-11-12 DIAGNOSIS — F32A Depression, unspecified: Secondary | ICD-10-CM | POA: Diagnosis present

## 2020-11-12 DIAGNOSIS — I700 Atherosclerosis of aorta: Secondary | ICD-10-CM | POA: Diagnosis present

## 2020-11-12 DIAGNOSIS — Z8249 Family history of ischemic heart disease and other diseases of the circulatory system: Secondary | ICD-10-CM | POA: Diagnosis not present

## 2020-11-12 DIAGNOSIS — K2100 Gastro-esophageal reflux disease with esophagitis, without bleeding: Secondary | ICD-10-CM | POA: Diagnosis present

## 2020-11-12 DIAGNOSIS — R933 Abnormal findings on diagnostic imaging of other parts of digestive tract: Secondary | ICD-10-CM | POA: Diagnosis not present

## 2020-11-12 DIAGNOSIS — E1110 Type 2 diabetes mellitus with ketoacidosis without coma: Secondary | ICD-10-CM | POA: Diagnosis present

## 2020-11-12 DIAGNOSIS — Z7982 Long term (current) use of aspirin: Secondary | ICD-10-CM | POA: Diagnosis not present

## 2020-11-12 HISTORY — PX: ESOPHAGOGASTRODUODENOSCOPY (EGD) WITH PROPOFOL: SHX5813

## 2020-11-12 LAB — CBC
HCT: 37.8 % — ABNORMAL LOW (ref 39.0–52.0)
Hemoglobin: 12.6 g/dL — ABNORMAL LOW (ref 13.0–17.0)
MCH: 29.6 pg (ref 26.0–34.0)
MCHC: 33.3 g/dL (ref 30.0–36.0)
MCV: 88.9 fL (ref 80.0–100.0)
Platelets: 236 10*3/uL (ref 150–400)
RBC: 4.25 MIL/uL (ref 4.22–5.81)
RDW: 15 % (ref 11.5–15.5)
WBC: 11.1 10*3/uL — ABNORMAL HIGH (ref 4.0–10.5)
nRBC: 0 % (ref 0.0–0.2)

## 2020-11-12 LAB — BASIC METABOLIC PANEL
Anion gap: 12 (ref 5–15)
BUN: 30 mg/dL — ABNORMAL HIGH (ref 8–23)
CO2: 27 mmol/L (ref 22–32)
Calcium: 8.6 mg/dL — ABNORMAL LOW (ref 8.9–10.3)
Chloride: 99 mmol/L (ref 98–111)
Creatinine, Ser: 1.05 mg/dL (ref 0.61–1.24)
GFR, Estimated: >60 mL/min (ref >60)
Glucose, Bld: 114 mg/dL — ABNORMAL HIGH (ref 70–99)
Potassium: 4.3 mmol/L (ref 3.5–5.1)
Sodium: 138 mmol/L (ref 135–145)

## 2020-11-12 LAB — GLUCOSE, CAPILLARY
Glucose-Capillary: 103 mg/dL — ABNORMAL HIGH (ref 70–99)
Glucose-Capillary: 115 mg/dL — ABNORMAL HIGH (ref 70–99)
Glucose-Capillary: 116 mg/dL — ABNORMAL HIGH (ref 70–99)
Glucose-Capillary: 130 mg/dL — ABNORMAL HIGH (ref 70–99)
Glucose-Capillary: 170 mg/dL — ABNORMAL HIGH (ref 70–99)
Glucose-Capillary: 96 mg/dL (ref 70–99)

## 2020-11-12 LAB — HEMOGLOBIN A1C
Hgb A1c MFr Bld: 7.7 % — ABNORMAL HIGH (ref 4.8–5.6)
Mean Plasma Glucose: 174.29 mg/dL

## 2020-11-12 SURGERY — ESOPHAGOGASTRODUODENOSCOPY (EGD) WITH PROPOFOL
Anesthesia: General

## 2020-11-12 MED ORDER — MEMANTINE HCL ER 28 MG PO CP24
28.0000 mg | ORAL_CAPSULE | Freq: Every day | ORAL | Status: DC
  Filled 2020-11-12 (×2): qty 1

## 2020-11-12 MED ORDER — FLUTICASONE FUROATE-VILANTEROL 200-25 MCG/INH IN AEPB
1.0000 | INHALATION_SPRAY | Freq: Every day | RESPIRATORY_TRACT | Status: DC
  Filled 2020-11-12: qty 28

## 2020-11-12 MED ORDER — PROPOFOL 10 MG/ML IV BOLUS
INTRAVENOUS | Status: DC | PRN
  Administered 2020-11-12: 70 mg via INTRAVENOUS

## 2020-11-12 MED ORDER — LIDOCAINE HCL (CARDIAC) PF 100 MG/5ML IV SOSY
PREFILLED_SYRINGE | INTRAVENOUS | Status: DC | PRN
  Administered 2020-11-12: 100 mg via INTRAVENOUS

## 2020-11-12 MED ORDER — GLYCOPYRROLATE 0.2 MG/ML IJ SOLN
INTRAMUSCULAR | Status: DC | PRN
  Administered 2020-11-12: .2 mg via INTRAVENOUS

## 2020-11-12 MED ORDER — DONEPEZIL HCL 5 MG PO TABS
10.0000 mg | ORAL_TABLET | Freq: Every day | ORAL | Status: DC
  Filled 2020-11-12 (×2): qty 2

## 2020-11-12 MED ORDER — PROPOFOL 500 MG/50ML IV EMUL
INTRAVENOUS | Status: DC | PRN
  Administered 2020-11-12: 140 ug/kg/min via INTRAVENOUS

## 2020-11-12 MED ORDER — LABETALOL HCL 5 MG/ML IV SOLN: 20.0000 mg | INTRAVENOUS | Status: DC | PRN

## 2020-11-12 NOTE — Consult Note (Signed)
Midge Minium, MD [facility]  [address] [place], [place] [address] Phone: [phone] Fax : [phone]  Consultation  Referring Provider:     Dr. Arville Care Primary Care Physician:  Franciso Bend, NP Primary Gastroenterologist: Gentry Fitz         Reason for Consultation:     Nausea vomiting  Date of Admission:  11/11/2020 Date of Consultation:  11/12/2020         HPI:   Johnny [name] is a 65 y.o. male who came to the hospital with a history of heartburn schizophrenia and came in with intractable nausea and vomiting over the last few days prior to being admitted.  He also had reported poor p.o. intake and vomiting of coffee-ground emesis.  He states that he has epigastric and substernal chest pain on admission the patient had been previously in the ER the day prior to admission with the same symptoms and was given IV hydration and discharged.  The patient was admitted with uncontrolled type 2 diabetes with nonacidotic hyperglycemia with acute kidney injury and a CT scan showing acute esophagitis with esophageal thickening. The patient states he had no further vomiting or coffee-ground emesis today but was nauseated earlier today.  He does report that he is hungry and would like to eat.  I was called to see the patient due to his abnormal CT scan finding and GERD-like symptoms.  With intractable nausea vomiting.  Past Medical History:  Diagnosis Date  . Anemia   . Clostridium difficile colitis   . Diabetes mellitus without complication (HCC)   . DM (diabetes mellitus) (HCC)   . GERD (gastroesophageal reflux disease)   . Herpes   . Hyperlipidemia   . Hypertension   . Schizophrenia [facility])     Past Surgical History:  Procedure Laterality Date  . CHOLECYSTECTOMY      Prior to Admission medications   Medication Sig Start Date End Date Taking? Authorizing Provider  albuterol (PROVENTIL HFA;VENTOLIN HFA) 108 (90 BASE) MCG/ACT inhaler Inhale 2 puffs into the lungs every 4  (four) hours as needed for wheezing or shortness of breath.    [provider]  ammonium lactate (AMLACTIN) 12 % cream Apply 1 g topically daily.     [provider]  aspirin 81 MG chewable tablet Chew 81 mg by mouth daily.     [provider]  benazepril (LOTENSIN) 20 MG tablet Take 20 mg by mouth daily. 06/11/20   [provider]  benztropine (COGENTIN) 1 MG tablet Take 1 mg by mouth 2 (two) times daily.    [provider]  BREZTRI AEROSPHERE 160-9-4.8 MCG/ACT AERO Inhale 2 puffs into the lungs 2 (two) times daily. 05/14/20   [provider]  buPROPion (WELLBUTRIN XL) 300 MG 24 hr tablet Take 300 mg by mouth daily. 06/11/20   [provider]  Canagliflozin-Metformin HCl (INVOKAMET) 50-1000 MG TABS Take 1 tablet by mouth 2 (two) times daily with a meal.    [provider]  clonazePAM (KLONOPIN) 1 MG tablet Take 1 mg by mouth 3 (three) times daily.    [provider]  colesevelam (WELCHOL) 625 MG tablet Take 1,875 mg by mouth 2 (two) times daily.     [provider]  divalproex (DEPAKOTE ER) 500 MG 24 hr tablet Take 500 mg by mouth 2 (two) times daily.     [provider]  fluPHENAZine (PROLIXIN) 5 MG tablet Take 5 mg by mouth 2 (two) times daily. Pt  also takes daily as needed for psychosis/agitation.    [provider]  hydrochlorothiazide (MICROZIDE) 12.5 MG capsule Take 12.5 mg by mouth daily. 06/11/20   [provider]  insulin glargine (LANTUS) 100 UNIT/ML injection Inject 30 Units into the skin at bedtime.    [provider]  INVEGA TRINZA 819 MG/2.625ML injection Inject 819 mg into the muscle every 30 (thirty) days. 06/01/20   [provider]  LINZESS 145 MCG CAPS capsule Take 145 mcg by mouth daily. 06/11/20   [provider]  Memantine HCl-Donepezil HCl (NAMZARIC) 28-10 MG CP24 Take 1 capsule by mouth at bedtime.     [provider]  omeprazole  (PRILOSEC) 40 MG capsule Take 40 mg by mouth daily. 06/11/20   [provider]  ondansetron (ZOFRAN ODT) 4 MG disintegrating tablet Take 1 tablet (4 mg total) by mouth every 8 (eight) hours as needed for nausea or vomiting. 11/10/20   Merwyn Katos, MD  pioglitazone (ACTOS) 45 MG tablet Take 45 mg by mouth daily.    [provider]  polyethylene glycol (MIRALAX / GLYCOLAX) packet Take 17 g by mouth daily.    [provider]  pravastatin (PRAVACHOL) 40 MG tablet Take 40 mg by mouth at bedtime.     [provider]  QUEtiapine (SEROQUEL) 400 MG tablet Take 600 mg by mouth at bedtime.    [provider]  sitaGLIPtin (JANUVIA) 100 MG tablet Take 100 mg by mouth daily.    [provider]  SYNJARDY 12.01-999 MG TABS Take 1 tablet by mouth 2 (two) times daily. 06/11/20   [provider]  tiotropium (SPIRIVA) 18 MCG inhalation capsule Place 18 mcg into inhaler and inhale daily.    [provider]  traZODone (DESYREL) 100 MG tablet Take 200 mg by mouth at bedtime.    [provider]  Vitamin D, Ergocalciferol, (DRISDOL) 50000 UNITS CAPS capsule Take 50,000 Units by mouth every 7 (seven) days.    [provider]    Family History  Problem Relation Age of Onset  . Diabetes Mother   . Kidney failure Mother   . Diabetes Father   . CAD Brother      Social History   Tobacco Use  . Smoking status: Current Every Day Smoker    Packs/day: 1.00    Types: Cigarettes  . Smokeless tobacco: Never Used  Substance Use Topics  . Alcohol use: No  . Drug use: No    Allergies as of 11/11/2020 - Review Complete 11/11/2020  Allergen Reaction Noted  . Fish allergy Rash 03/14/2015  . Shellfish allergy Rash 03/14/2015    Review of Systems:    All systems reviewed and negative except where noted in HPI.   Physical Exam:  Vital signs in last 24 hours: Temp:  [97.9 F (36.6 C)-98.9 F (37.2 C)] 97.9 F (36.6 C) (03/04  1129) Pulse Rate:  [70-120] 96 (03/04 1129) Resp:  [16-19] 16 (03/04 0451) BP: (134-155)/(70-95) 155/79 (03/04 1129) SpO2:  [97 %-99 %] 97 % (03/04 1129) Weight:  [72 kg] 72 kg (03/03 1635)   General:   Pleasant, cooperative in NAD Head:  Normocephalic and atraumatic. Eyes:   No icterus.   Conjunctiva pink. PERRLA. Ears:  Normal auditory acuity. Neck:  Supple; no masses or thyroidomegaly Lungs: Respirations even and unlabored. Lungs clear to auscultation bilaterally.   No wheezes, crackles, or rhonchi.  Heart:  Regular rate and rhythm;  Without murmur, clicks, rubs or gallops Abdomen:  Soft, nondistended, nontender. Normal bowel sounds. No appreciable masses or hepatomegaly.  No rebound or guarding.  Rectal:  Not performed. Msk:  Symmetrical without gross deformities.    Extremities:  Without edema, cyanosis or clubbing. Neurologic:  Alert and oriented x3;  grossly normal neurologically. Skin:  Intact without significant lesions or rashes. Cervical Nodes:  No significant cervical adenopathy. Psych:  Alert and cooperative. Normal affect.  LAB RESULTS: Recent Labs    11/10/20 1640 11/11/20 1637 11/12/20 0505  WBC 12.0* 14.7* 11.1*  HGB 13.4 14.6 12.6*  HCT 39.5 43.3 37.8*  PLT 233 257 236   BMET Recent Labs    11/10/20 1640 11/11/20 1637 11/12/20 0505  NA 134* 134* 138  K 4.1 4.5 4.3  CL 93* 90* 99  CO2 26 24 27   GLUCOSE 157* 180* 114*  BUN 34* 39* 30*  CREATININE 1.17 1.34* 1.05  CALCIUM 10.2 10.1 8.6*   LFT Recent Labs    11/11/20 1637  PROT 7.9  ALBUMIN 4.2  AST 14*  ALT 13  ALKPHOS 81  BILITOT 1.3*   PT/INR No results for input(s): LABPROT, INR in the last 72 hours.  STUDIES: DG Chest 2 View  Result Date: 11/10/2020 CLINICAL DATA:  Midsternal chest pain and shortness of breath, belching EXAM: CHEST - 2 VIEW COMPARISON:  06/17/2020 FINDINGS: Frontal and lateral views of the chest demonstrate a stable cardiac silhouette. Chronic areas of scarring and  atelectasis are seen, greatest at the right lung base. Trace right pleural effusion versus pleural thickening again noted, stable. No acute airspace disease. No pneumothorax. No acute bony abnormalities. IMPRESSION: 1. Chronic areas of scarring and atelectasis, greatest at the right lung base. 2. Trace right pleural effusion versus pleural thickening, stable. 3. No acute intrathoracic process. Electronically Signed   By: 08/17/2020 M.D.   On: 11/10/2020 18:03   CT Chest Wo Contrast  Result Date: 11/11/2020 CLINICAL DATA:  Vomiting. EXAM: CT CHEST WITHOUT CONTRAST TECHNIQUE: Multidetector CT imaging of the chest was performed following the standard protocol without IV contrast. COMPARISON:  June 15, 2020 FINDINGS: Cardiovascular: There is marked severity calcification of the aortic arch. Normal heart size. No pericardial effusion. Mediastinum/Nodes: No enlarged mediastinal or axillary lymph nodes. The thyroid gland and trachea demonstrate no significant findings. Moderate to marked severity diffuse esophageal wall thickening is noted. This involves the mid and distal portion of the esophagus. Lungs/Pleura: There is mild emphysematous lung disease. Mild, stable linear scarring and/or atelectasis is seen within the anterior aspect of the right upper lobe. Stable moderate severity scarring and/or round atelectasis is seen within the posterior aspect of the right lung base. A mild area of stable linear scarring is seen along the posteromedial aspect of the right lung base the (axial CT images 120 through 127, CT series number 3). There is a small left pleural effusion. No pneumothorax is identified. Upper Abdomen: Multiple surgical clips are seen in the gallbladder fossa. A 3.2 cm x 2.4 cm low-attenuation left adrenal mass is seen. A 1.9 cm x 0.7 cm low-attenuation right adrenal mass is also noted. Musculoskeletal: No chest wall mass or suspicious bone lesions identified. IMPRESSION: 1. Stable moderate severity  right basilar scarring and/or rounded atelectasis with chronic changes within the posterior medial aspect of the right lung base. 2. Esophageal wall thickening which may represent moderate to marked severity esophagitis. Sequelae associated with an underlying neoplasm cannot be excluded. 3.   Small left pleural effusion. 4. Bilateral adrenal adenomas. Emphysema (ICD10-J43.9). Electronically  Signed   By: Aram Candela M.D.   On: 11/11/2020 20:39   CT ABDOMEN PELVIS W CONTRAST  Result Date: 11/11/2020 CLINICAL DATA:  Vomiting. EXAM: CT ABDOMEN AND PELVIS WITH CONTRAST TECHNIQUE: Multidetector CT imaging of the abdomen and pelvis was performed using the standard protocol following bolus administration of intravenous contrast. CONTRAST:  OMNIPAQUE IOHEXOL 300 MG/ML  SOLN COMPARISON:  November 15, 2010 FINDINGS: Lower chest: Moderate severity atelectasis and/or infiltrate is seen within the right lung base. There is a small right pleural effusion. A thin (approximately 2 mm thick) linear area of air attenuation is seen along the medial aspect of the right lung base (axial CT images 4 through 7, CT series number 4). Hepatobiliary: There is diffuse fatty infiltration of the liver parenchyma. No focal liver abnormality is seen. Status post cholecystectomy. No biliary dilatation. Pancreas: Unremarkable. No pancreatic ductal dilatation or surrounding inflammatory changes. Spleen: Normal in size without focal abnormality. Adrenals/Urinary Tract: A stable 3.3 cm x 2.2 cm heterogeneous left adrenal mass is noted. A stable 1.4 cm x 0.6 cm low-attenuation right adrenal mass is also seen. Kidneys are normal, without renal calculi or hydronephrosis. 1.0 cm and 0.7 cm cysts are seen within the lateral aspect of the mid right kidney. A 2.0 cm x 1.6 cm simple cyst is also noted within the posterolateral aspect of the mid left kidney. Bladder is unremarkable. Stomach/Bowel: There is moderate to marked severity thickening of the  visualized portion of the distal esophagus. Stomach is within normal limits. Appendix appears normal. A large amount of stool is seen throughout the colon. No evidence of bowel wall thickening, distention, or inflammatory changes. Vascular/Lymphatic: Aortic atherosclerosis. No enlarged abdominal or pelvic lymph nodes. Reproductive: Prostate is unremarkable. Other: No abdominal wall hernia or abnormality. No abdominopelvic ascites or intra-abdominal free air. Musculoskeletal: No acute or significant osseous findings. IMPRESSION: 1. Findings worrisome for the presence of a small pneumothorax along the posteromedial aspect of the right lung base. Correlation with chest CT is recommended. 2. Moderate severity right basilar atelectasis and/or infiltrate with a small right pleural effusion. 3. Findings which may represent moderate to marked severity esophagitis within the visualized portion of the distal esophagus. Sequelae associated with an underlying neoplastic process cannot be excluded. 4. Evidence of prior cholecystectomy. 5. Findings likely consistent with stable bilateral adrenal adenomas. 6. Aortic atherosclerosis. Aortic Atherosclerosis (ICD10-I70.0). Electronically Signed   By: Aram Candela M.D.   On: 11/11/2020 19:26      Impression / Plan:   Assessment: Active Problems:   Uncontrolled type 2 diabetes mellitus (HCC)   Frazier L Rafferty is a 65 y.o. y/o male with with a CT scan showing thickening of the esophagus.  The patient has had coffee-ground emesis and likely has esophagitis.  The patient was treated with a PPI continuous drip with Zofran for his nausea.  Plan:  I recommend the patient continue with his PPI.  The patient will be brought to the endoscopy unit for an upper endoscopy today.  As stated above it is likely that this patient has reflux esophagitis and would benefit from being on a PPI and having the bed propped up.  When I came to see the patient today the bed was flat and he  was sleeping quietly with the head of the bed down.  The patient has been explained that that increases the risk of reflux.  The patient has been explained the plan agrees with it.  Thank you for involving me in  the care of this patient.      LOS: 0 days   Midge Miniumarren Mateus Rewerts, MD, [facility] 11/12/2020, 1:03 PM,  Pager [phone] 7am-5pm  Check AMION for 5pm -7am coverage and on weekends   Note: This dictation was prepared with Dragon dictation along with smaller phrase technology. Any transcriptional errors that result from this process are unintentional.

## 2020-11-12 NOTE — Progress Notes (Signed)
Patient has refused all bedtime meds. He states he is not taking anything. E Ouma RN was notified.

## 2020-11-12 NOTE — Anesthesia Preprocedure Evaluation (Signed)
Anesthesia Evaluation  Patient identified by MRN, date of birth, ID band Patient awake    Reviewed: Allergy & Precautions, NPO status , Patient's Chart, lab work & pertinent test results  History of Anesthesia Complications Negative for: history of anesthetic complications  Airway Mallampati: II  TM Distance: >3 FB Neck ROM: Full    Dental  (+) Poor Dentition   Pulmonary neg sleep apnea, neg COPD, Current Smoker and Patient abstained from smoking.,    breath sounds clear to auscultation- rhonchi (-) wheezing      Cardiovascular hypertension, Pt. on medications + CAD and + Past MI  (-) Cardiac Stents and (-) CABG  Rhythm:Regular Rate:Normal - Systolic murmurs and - Diastolic murmurs    Neuro/Psych neg Seizures PSYCHIATRIC DISORDERS Anxiety Depression Schizophrenia negative neurological ROS     GI/Hepatic Neg liver ROS, GERD  ,  Endo/Other  diabetes, Insulin Dependent  Renal/GU negative Renal ROS     Musculoskeletal negative musculoskeletal ROS (+)   Abdominal (+) - obese,   Peds  Hematology  (+) anemia ,   Anesthesia Other Findings Past Medical History: No date: Anemia No date: Clostridium difficile colitis No date: Diabetes mellitus without complication (HCC) No date: DM (diabetes mellitus) (HCC) No date: GERD (gastroesophageal reflux disease) No date: Herpes No date: Hyperlipidemia No date: Hypertension No date: Schizophrenia (HCC)   Reproductive/Obstetrics                             Anesthesia Physical Anesthesia Plan  ASA: III  Anesthesia Plan: General   Post-op Pain Management:    Induction: Intravenous  PONV Risk Score and Plan: 0 and Propofol infusion  Airway Management Planned: Natural Airway  Additional Equipment:   Intra-op Plan:   Post-operative Plan:   Informed Consent: I have reviewed the patients History and Physical, chart, labs and discussed the  procedure including the risks, benefits and alternatives for the proposed anesthesia with the patient or authorized representative who has indicated his/her understanding and acceptance.     Dental advisory given and Consent reviewed with POA (consent obtained via phone from legal guardian)  Plan Discussed with: CRNA and Anesthesiologist  Anesthesia Plan Comments:         Anesthesia Quick Evaluation

## 2020-11-12 NOTE — Progress Notes (Signed)
PROGRESS NOTE    Johnny [name]  ZOX:[id number] DOB: 1955/11/20 DOA: 11/11/2020 PCP: Franciso Bend, NP    Brief Narrative:  Johnny [name] is a 65 y.o. African-American male with medical history significant for hypertension, dyslipidemia, type 2 diabetes mellitus, GERD and schizophrenia, who presented to the emergency room with acute onset of intractable vomiting over the last couple of days with poor p.o. intake.  This is associated with burning epigastric and substernal pain.  No diarrhea or melena or bright red blood per rectum.  He admitted however to coffee-ground emesis.  No dysuria, oliguria or hematuria or flank pain.  No polyuria and polydipsia.  No dyspnea or cough or wheezing.  No other bleeding diathesis.  No fever or chills.  The patient was seen yesterday in the emergency room here with similar symptoms with atypical chest pain and dyspnea, managed with IV hydration and felt significantly better and discharged.   3/4-s/p EGD now  Consultants:   GI  Procedures:   Antimicrobials:       Subjective: Laying in bed, nods yes or no to my questions without opening his eyes.  Denies nausea or vomiting  Objective: Vitals:   11/11/20 1820 11/11/20 1930 11/11/20 2306 11/12/20 0451  BP: 134/87 (!) 144/70 (!) 135/95 (!) 153/70  Pulse: 95 93 70 91  Resp: Temp:   98.9 F (37.2 C) 98.6 F (37 C)  TempSrc:   Oral Oral  SpO2: 99% 97% 99% 97%  Weight:      Height:        Intake/Output Summary (Last 24 hours) at 11/12/2020 8119 Last data filed at 11/12/2020 0300 Gross per 24 hour  Intake 414.2 ml  Output --  Net 414.2 ml   Filed Weights   11/11/20 1635  Weight: 72 kg    Examination:  General exam: Appears calm and comfortable eyes closed throughout exam Respiratory system: Clear to auscultation. Respiratory effort normal. Cardiovascular system: S1 & S2 heard, RRR. No JVD, murmurs, rubs, gallops or clicks.  Gastrointestinal system: Abdomen is  nondistended, soft and nontender. Normal bowel sounds heard. Central nervous system: Unable to assess Extremities: No edema Skin: Warm dry     Data Reviewed: I have personally reviewed following labs and imaging studies  CBC: Recent Labs  Lab 11/10/20 1640 11/11/20 1637 11/12/20 0505  WBC 12.0* 14.7* 11.1*  HGB 13.4 14.6 12.6*  HCT 39.5 43.3 37.8*  MCV 88.0 87.3 88.9  PLT 233 257 236   Basic Metabolic Panel: Recent Labs  Lab 11/10/20 1640 11/11/20 1637 11/12/20 0505  NA 134* 134* 138  K 4.1 4.5 4.3  CL 93* 90* 99  CO2 GLUCOSE 157* 180* 114*  BUN 34* 39* 30*  CREATININE 1.17 1.34* 1.05  CALCIUM 10.2 10.1 8.6*   GFR: Estimated Creatinine Clearance: 74.3 mL/min (by C-G formula based on SCr of 1.05 mg/dL). Liver Function Tests: Recent Labs  Lab 11/11/20 1637  AST 14*  ALT 13  ALKPHOS 81  BILITOT 1.3*  PROT 7.9  ALBUMIN 4.2   Recent Labs  Lab 11/11/20 1637  LIPASE 74*   No results for input(s): AMMONIA in the last 168 hours. Coagulation Profile: No results for input(s): INR, PROTIME in the last 168 hours. Cardiac Enzymes: No results for input(s): CKTOTAL, CKMB, CKMBINDEX, TROPONINI in the last 168 hours. BNP (last 3 results) No results for input(s): PROBNP in the last 8760 hours. HbA1C: Recent Labs    11/11/20  1637  HGBA1C 7.7*   CBG: Recent Labs  Lab 11/11/20 2234 11/11/20 2328 11/12/20 0025 11/12/20 0134 11/12/20 0759  GLUCAP 126* 128* 96 103* 115*   Lipid Profile: No results for input(s): CHOL, HDL, LDLCALC, TRIG, CHOLHDL, LDLDIRECT in the last 72 hours. Thyroid Function Tests: No results for input(s): TSH, T4TOTAL, FREET4, T3FREE, THYROIDAB in the last 72 hours. Anemia Panel: No results for input(s): VITAMINB12, FOLATE, FERRITIN, TIBC, IRON, RETICCTPCT in the last 72 hours. Sepsis Labs: No results for input(s): PROCALCITON, LATICACIDVEN in the last 168 hours.  Recent Results (from the past 240 hour(s))  Resp Panel by  RT-PCR (Flu A&B, Covid) Nasopharyngeal Swab     Status: None   Collection Time: 11/11/20  9:37 PM   Specimen: Nasopharyngeal Swab; Nasopharyngeal(NP) swabs in vial transport medium  Result Value Ref Range Status   SARS Coronavirus 2 by RT PCR NEGATIVE NEGATIVE Final    Comment: (NOTE) SARS-CoV-2 target nucleic acids are NOT DETECTED.  The SARS-CoV-2 RNA is generally detectable in upper respiratory specimens during the acute phase of infection. The lowest concentration of SARS-CoV-2 viral copies this assay can detect is 138 copies/mL. A negative result does not preclude SARS-Cov-2 infection and should not be used as the sole basis for treatment or other patient management decisions. A negative result may occur with  improper specimen collection/handling, submission of specimen other than nasopharyngeal swab, presence of viral mutation(s) within the areas targeted by this assay, and inadequate number of viral copies(<138 copies/mL). A negative result must be combined with clinical observations, patient history, and epidemiological information. The expected result is Negative.  Fact Sheet for Patients:  BloggerCourse.com  Fact Sheet for Healthcare Providers:  SeriousBroker.it  This test is no t yet approved or cleared by the [place] FDA and  has been authorized for detection and/or diagnosis of SARS-CoV-2 by FDA under an Emergency Use Authorization (EUA). This EUA will remain  in effect (meaning this test can be used) for the duration of the COVID-19 declaration under Section 564(b)(1) of the Act, 21 U.S.C.section 360bbb-3(b)(1), unless the authorization is terminated  or revoked sooner.       Influenza A by PCR NEGATIVE NEGATIVE Final   Influenza B by PCR NEGATIVE NEGATIVE Final    Comment: (NOTE) The Xpert Xpress SARS-CoV-2/FLU/RSV plus assay is intended as an aid in the diagnosis of influenza from Nasopharyngeal swab  specimens and should not be used as a sole basis for treatment. Nasal washings and aspirates are unacceptable for Xpert Xpress SARS-CoV-2/FLU/RSV testing.  Fact Sheet for Patients: BloggerCourse.com  Fact Sheet for Healthcare Providers: SeriousBroker.it  This test is not yet approved or cleared by the [place] FDA and has been authorized for detection and/or diagnosis of SARS-CoV-2 by FDA under an Emergency Use Authorization (EUA). This EUA will remain in effect (meaning this test can be used) for the duration of the COVID-19 declaration under Section 564(b)(1) of the Act, 21 U.S.C. section 360bbb-3(b)(1), unless the authorization is terminated or revoked.  Performed at [facility], [address], [place], [place] [address]          Radiology Studies: DG Chest 2 View  Result Date: 11/10/2020 CLINICAL DATA:  Midsternal chest pain and shortness of breath, belching EXAM: CHEST - 2 VIEW COMPARISON:  06/17/2020 FINDINGS: Frontal and lateral views of the chest demonstrate a stable cardiac silhouette. Chronic areas of scarring and atelectasis are seen, greatest at the right lung base. Trace right pleural effusion versus pleural thickening again  noted, stable. No acute airspace disease. No pneumothorax. No acute bony abnormalities. IMPRESSION: 1. Chronic areas of scarring and atelectasis, greatest at the right lung base. 2. Trace right pleural effusion versus pleural thickening, stable. 3. No acute intrathoracic process. Electronically Signed   By: Sharlet SalinaMichael  Brown M.D.   On: 11/10/2020 18:03   CT Chest Wo Contrast  Result Date: 11/11/2020 CLINICAL DATA:  Vomiting. EXAM: CT CHEST WITHOUT CONTRAST TECHNIQUE: Multidetector CT imaging of the chest was performed following the standard protocol without IV contrast. COMPARISON:  June 15, 2020 FINDINGS: Cardiovascular: There is marked severity calcification of the aortic arch. Normal  heart size. No pericardial effusion. Mediastinum/Nodes: No enlarged mediastinal or axillary lymph nodes. The thyroid gland and trachea demonstrate no significant findings. Moderate to marked severity diffuse esophageal wall thickening is noted. This involves the mid and distal portion of the esophagus. Lungs/Pleura: There is mild emphysematous lung disease. Mild, stable linear scarring and/or atelectasis is seen within the anterior aspect of the right upper lobe. Stable moderate severity scarring and/or round atelectasis is seen within the posterior aspect of the right lung base. A mild area of stable linear scarring is seen along the posteromedial aspect of the right lung base the (axial CT images 120 through 127, CT series number 3). There is a small left pleural effusion. No pneumothorax is identified. Upper Abdomen: Multiple surgical clips are seen in the gallbladder fossa. A 3.2 cm x 2.4 cm low-attenuation left adrenal mass is seen. A 1.9 cm x 0.7 cm low-attenuation right adrenal mass is also noted. Musculoskeletal: No chest wall mass or suspicious bone lesions identified. IMPRESSION: 1. Stable moderate severity right basilar scarring and/or rounded atelectasis with chronic changes within the posterior medial aspect of the right lung base. 2. Esophageal wall thickening which may represent moderate to marked severity esophagitis. Sequelae associated with an underlying neoplasm cannot be excluded. 3.   Small left pleural effusion. 4. Bilateral adrenal adenomas. Emphysema (ICD10-J43.9). Electronically Signed   By: Aram Candelahaddeus  Houston M.D.   On: 11/11/2020 20:39   CT ABDOMEN PELVIS W CONTRAST  Result Date: 11/11/2020 CLINICAL DATA:  Vomiting. EXAM: CT ABDOMEN AND PELVIS WITH CONTRAST TECHNIQUE: Multidetector CT imaging of the abdomen and pelvis was performed using the standard protocol following bolus administration of intravenous contrast. CONTRAST:  100mL OMNIPAQUE IOHEXOL 300 MG/ML  SOLN COMPARISON:  November 15, 2010 FINDINGS: Lower chest: Moderate severity atelectasis and/or infiltrate is seen within the right lung base. There is a small right pleural effusion. A thin (approximately 2 mm thick) linear area of air attenuation is seen along the medial aspect of the right lung base (axial CT images 4 through 7, CT series number 4). Hepatobiliary: There is diffuse fatty infiltration of the liver parenchyma. No focal liver abnormality is seen. Status post cholecystectomy. No biliary dilatation. Pancreas: Unremarkable. No pancreatic ductal dilatation or surrounding inflammatory changes. Spleen: Normal in size without focal abnormality. Adrenals/Urinary Tract: A stable 3.3 cm x 2.2 cm heterogeneous left adrenal mass is noted. A stable 1.4 cm x 0.6 cm low-attenuation right adrenal mass is also seen. Kidneys are normal, without renal calculi or hydronephrosis. 1.0 cm and 0.7 cm cysts are seen within the lateral aspect of the mid right kidney. A 2.0 cm x 1.6 cm simple cyst is also noted within the posterolateral aspect of the mid left kidney. Bladder is unremarkable. Stomach/Bowel: There is moderate to marked severity thickening of the visualized portion of the distal esophagus. Stomach is within normal limits. Appendix appears  normal. A large amount of stool is seen throughout the colon. No evidence of bowel wall thickening, distention, or inflammatory changes. Vascular/Lymphatic: Aortic atherosclerosis. No enlarged abdominal or pelvic lymph nodes. Reproductive: Prostate is unremarkable. Other: No abdominal wall hernia or abnormality. No abdominopelvic ascites or intra-abdominal free air. Musculoskeletal: No acute or significant osseous findings. IMPRESSION: 1. Findings worrisome for the presence of a small pneumothorax along the posteromedial aspect of the right lung base. Correlation with chest CT is recommended. 2. Moderate severity right basilar atelectasis and/or infiltrate with a small right pleural effusion. 3. Findings  which may represent moderate to marked severity esophagitis within the visualized portion of the distal esophagus. Sequelae associated with an underlying neoplastic process cannot be excluded. 4. Evidence of prior cholecystectomy. 5. Findings likely consistent with stable bilateral adrenal adenomas. 6. Aortic atherosclerosis. Aortic Atherosclerosis (ICD10-I70.0). Electronically Signed   By: Aram Candela M.D.   On: 11/11/2020 19:26        Scheduled Meds: . ammonium lactate  1 application Topical Daily  . benazepril  20 mg Oral Daily  . benztropine  1 mg Oral BID  . Budeson-Glycopyrrol-Formoterol  2 puff Inhalation BID  . buPROPion  300 mg Oral Daily  . clonazePAM  1 mg Oral TID  . colesevelam  1,875 mg Oral BID  . divalproex  500 mg Oral BID  . memantine  28 mg Oral QHS   And  . donepezil  10 mg Oral QHS  . enoxaparin (LOVENOX) injection  40 mg Subcutaneous Q24H  . fluPHENAZine  5 mg Oral BID  . linaclotide  145 mcg Oral Daily  . linagliptin  5 mg Oral Daily  . [START ON 11/15/2020] pantoprazole  40 mg Intravenous Q12H  . pantoprazole (PROTONIX) IV  40 mg Intravenous Once  . pioglitazone  45 mg Oral Daily  . polyethylene glycol  17 g Oral Daily  . pravastatin  40 mg Oral q1800  . QUEtiapine  600 mg Oral QHS  . tiotropium  18 mcg Inhalation Daily  . traZODone  200 mg Oral QHS  . [START ON 11/14/2020] Vitamin D (Ergocalciferol)  50,000 Units Oral Q7 days   Continuous Infusions: . sodium chloride 150 mL/hr at 11/12/20 0029  . dextrose 5% lactated ringers Stopped (11/11/20 2219)  . lactated ringers    . pantoprozole (PROTONIX) infusion 8 mg/hr (11/12/20 0031)    Assessment & Plan:   Active Problems:   Uncontrolled type 2 diabetes mellitus (HCC)   1.  Uncontrolled type 2 diabetes mellitus with ketotic nonacidotic hyperglycemia. Currently n.p.o. for EGD today Improving Hold off on oral antidiabetics Anion gap closed  2.  Intractable nausea and vomiting like secondary to  acute esophagitis leading to #1 with possible mild upper GI bleeding. -The patient will be placed on IV PPI therapy with IV Protonix bolus followed by drip. -Chest CT without contrast was order and the report is currently pending for further assessment of his esophagus. 3/4-GI following Plan for EGD today Continue IV PPI   3.  Acute kidney injury likely prerenal secondary to recurrent nausea and vomiting and dehydration. Improved with IV fluids Cr 1.34>>1.05   4.  Essential hypertension. Stable Hold ACEI, chlorthalidone and htz given AKI.  -We will hold off Zestril, chlorthalidone and HCTZ given acute kidney injury.  5.  Dyslipidemia. On statin  6.  Schizophrenia, anxiety and depression. continue his fluphenazine, Klonopin, Seroquel and Wellbutrin XL as well as Cogentin.     DVT prophylaxis: SCD Code Status: Full  Family Communication: None at bedside  Status is: Observation  The patient remains OBS appropriate and will d/c before 2 midnights.  Dispo: The patient is from: Home              Anticipated d/c is to: Home              Patient currently is not medically stable to d/c.   Difficult to place patient No Exacerbation discharge possibly in a.m.  Patient getting EGD           LOS: 0 days   Time spent: 35 minutes with more than 50% on COC    Lynn Ito, MD [facility] Pager [phone]  If 7PM-7AM, please contact night-coverage 11/12/2020, 8:08 AM

## 2020-11-12 NOTE — Anesthesia Postprocedure Evaluation (Signed)
Anesthesia Post Note  Patient: Johnny [name]  Procedure(s) Performed: ESOPHAGOGASTRODUODENOSCOPY (EGD) WITH PROPOFOL (N/A )  Patient location during evaluation: Endoscopy Anesthesia Type: General Level of consciousness: awake and alert and oriented Pain management: pain level controlled Vital Signs Assessment: post-procedure vital signs reviewed and stable Respiratory status: spontaneous breathing, nonlabored ventilation and respiratory function stable Cardiovascular status: blood pressure returned to baseline and stable Postop Assessment: no signs of nausea or vomiting Anesthetic complications: no   No complications documented.   Last Vitals:  Vitals:   11/12/20 1407 11/12/20 1417  BP: (!) 154/72 (!) 170/74  Pulse: 83 92  Resp: 15 17  Temp:    SpO2: 100% 100%    Last Pain:  Vitals:   11/12/20 1357  TempSrc:   PainSc: Asleep                 Maurilio Puryear

## 2020-11-12 NOTE — Op Note (Signed)
[facility] Gastroenterology Patient Name: Johnny [name] Procedure Date: 11/12/2020 1:25 PM MRN: [id number] Account #: [id number] Date of Birth: 02/19/1956 Admit Type: Inpatient Age: 65 Room: [facility] ENDO ROOM 2 Gender: Male Note Status: Finalized Procedure:             Upper GI endoscopy Indications:           Abnormal CT of the GI tract, Nausea Providers:             Midge Minium MD, MD Referring MD:          Lise Auer. Aundria Rud (Referring MD) Medicines:             Propofol per Anesthesia Complications:         No immediate complications. Procedure:             Pre-Anesthesia Assessment:                        - Prior to the procedure, a History and Physical was                         performed, and patient medications and allergies were                         reviewed. The patient's tolerance of previous                         anesthesia was also reviewed. The risks and benefits                         of the procedure and the sedation options and risks                         were discussed with the patient. All questions were                         answered, and informed consent was obtained. Prior                         Anticoagulants: The patient has taken no previous                         anticoagulant or antiplatelet agents. ASA Grade                         Assessment: II - A patient with mild systemic disease.                         After reviewing the risks and benefits, the patient                         was deemed in satisfactory condition to undergo the                         procedure.                        After obtaining informed consent, the endoscope was  passed under direct vision. Throughout the procedure,                         the patient's blood pressure, pulse, and oxygen                         saturations were monitored continuously. The Endoscope                         was introduced through the mouth,  and advanced to the                         second part of duodenum. The upper GI endoscopy was                         accomplished without difficulty. The patient tolerated                         the procedure well. Findings:      LA Grade D (one or more mucosal breaks involving at least 75% of       esophageal circumference) esophagitis with no bleeding was found in the       lower third of the esophagus.      Localized moderate inflammation characterized by erosions was found in       the gastric body.      The examined duodenum was normal. Impression:            - LA Grade D reflux esophagitis with no bleeding.                        - Gastritis.                        - Normal examined duodenum.                        - No specimens collected. Recommendation:        - Return patient to hospital ward for ongoing care.                        - Full liquid diet.                        - Continue present medications.                        - Use a proton pump inhibitor PO BID. Procedure Code(s):     --- Professional ---                        [phone], Esophagogastroduodenoscopy, flexible,                         transoral; diagnostic, including collection of                         specimen(s) by brushing or washing, when performed                         (separate procedure) Diagnosis Code(s):     ---  Professional ---                        R93.3, Abnormal findings on diagnostic imaging of                         other parts of digestive tract                        R11.0, Nausea                        K29.70, Gastritis, unspecified, without bleeding CPT copyright 2019 American Medical Association. All rights reserved. The codes documented in this report are preliminary and upon coder review may  be revised to meet current compliance requirements. Midge Minium MD, MD 11/12/2020 1:45:08 PM This report has been signed electronically. Number of Addenda: 0 Note Initiated On: 11/12/2020  1:25 PM Estimated Blood Loss:  Estimated blood loss: none.      [facility]

## 2020-11-12 NOTE — Plan of Care (Signed)
Continuing with plan of care. 

## 2020-11-12 NOTE — Clinical Social Work Note (Signed)
Per chart review, patient is from [facility] (as of October admission). Left voicemail for guardian.  Charlynn Court, CSW [phone]

## 2020-11-12 NOTE — Transfer of Care (Signed)
Immediate Anesthesia Transfer of Care Note  Patient: Johnny [name]  Procedure(s) Performed: ESOPHAGOGASTRODUODENOSCOPY (EGD) WITH PROPOFOL (N/A )  Patient Location: PACU  Anesthesia Type:General  Level of Consciousness: sedated  Airway & Oxygen Therapy: Patient Spontanous Breathing and Patient connected to nasal cannula oxygen  Post-op Assessment: Report given to RN and Post -op Vital signs reviewed and stable  Post vital signs: Reviewed and stable  Last Vitals:  Vitals Value Taken Time  BP 128/73 11/12/20 1347  Temp    Pulse 102 11/12/20 1347  Resp 20 11/12/20 1347  SpO2 99 % 11/12/20 1347    Last Pain:  Vitals:   11/12/20 1347  TempSrc: Temporal  PainSc: Asleep         Complications: No complications documented.

## 2020-11-13 DIAGNOSIS — K920 Hematemesis: Secondary | ICD-10-CM

## 2020-11-13 DIAGNOSIS — K209 Esophagitis, unspecified without bleeding: Secondary | ICD-10-CM | POA: Diagnosis not present

## 2020-11-13 DIAGNOSIS — K2901 Acute gastritis with bleeding: Secondary | ICD-10-CM

## 2020-11-13 LAB — GLUCOSE, CAPILLARY: Glucose-Capillary: 168 mg/dL — ABNORMAL HIGH (ref 70–99)

## 2020-11-13 MED ORDER — PANTOPRAZOLE SODIUM 40 MG PO TBEC: 40.0000 mg | DELAYED_RELEASE_TABLET | Freq: Two times a day (BID) | ORAL | Status: DC

## 2020-11-13 MED ORDER — PANTOPRAZOLE SODIUM 40 MG PO TBEC: 40.0000 mg | DELAYED_RELEASE_TABLET | Freq: Two times a day (BID) | ORAL | 0 refills | Status: AC

## 2020-11-13 NOTE — [place] FL2 (Signed)
[place] MEDICAID FL2 LEVEL OF CARE SCREENING TOOL     IDENTIFICATION  Patient Name: Johnny [name] Birthdate: 1956-08-05 Sex: male Admission Date (Current Location): 11/11/2020  [place] and [place] Number:  Chiropodist and Address:  [facility], [address] [place], [place] [address]      Provider Number: [id number]  Attending Physician Name and Address:  Lynn Ito, MD  Relative Name and Phone Number:  Meredith Staggers [phone]    Current Level of Care: Hospital Recommended Level of Care: Other (Comment) (Group Home) Prior Approval Number:    Date Approved/Denied:   PASRR Number:    Discharge Plan: Other (Comment) (Group Home)    Current Diagnoses: Patient Active Problem List   Diagnosis Date Noted  . GIB (gastrointestinal bleeding) 11/12/2020  . Intractable vomiting   . Hematemesis with nausea   . Abnormal CT scan, esophagus   . Nausea   . Uncontrolled type 2 diabetes mellitus (HCC) 11/11/2020  . Bacteremia 06/17/2020  . Acute renal failure (ARF) (HCC) 07/05/2015  . SINUS TACHYCARDIA 06/10/2008  . HYPOTENSION, ORTHOSTATIC 06/10/2008  . Type 2 diabetes mellitus with hyperlipidemia (HCC) 05/19/2008  . HYPOGLYCEMIA 04/27/2008  . HYPERLIPIDEMIA 01/22/2008  . Schizophrenia (HCC) 01/22/2008  . ANXIETY 01/22/2008  . DEPRESSION 01/22/2008  . Essential hypertension 01/22/2008  . MYOCARDIAL INFARCTION, HX OF 01/22/2008  . BRONCHITIS, CHRONIC 01/22/2008  . GERD 01/22/2008    Orientation RESPIRATION BLADDER Height & Weight     Self,Time,Situation,Place  Normal Continent Weight: 72 kg Height:  5\' 11"  (180.3 cm)  BEHAVIORAL SYMPTOMS/MOOD NEUROLOGICAL BOWEL NUTRITION STATUS      Continent Diet (Carb Modified)  AMBULATORY STATUS COMMUNICATION OF NEEDS Skin   Independent Verbally Normal                       Personal Care Assistance Level of Assistance  Bathing,Feeding,Dressing Bathing Assistance:  Independent Feeding assistance: Independent Dressing Assistance: Independent     Functional Limitations Info             SPECIAL CARE FACTORS FREQUENCY                       Contractures Contractures Info: Not present    Additional Factors Info  Code Status,Allergies Code Status Info: Full Allergies Info: No known allergies           Current Medications (11/13/2020):    Discharge Medications: albuterol 108 (90 Base) MCG/ACT inhaler Commonly known as: VENTOLIN HFA Inhale 2 puffs into the lungs every 4 (four) hours as needed for wheezing or shortness of breath.   ammonium lactate 12 % cream Commonly known as: AMLACTIN Apply 1 g topically daily.   benazepril 20 MG tablet Commonly known as: LOTENSIN Take 20 mg by mouth daily.   benztropine 1 MG tablet Commonly known as: COGENTIN Take 1 mg by mouth 2 (two) times daily.   Breztri Aerosphere 160-9-4.8 MCG/ACT Aero Generic drug: Budeson-Glycopyrrol-Formoterol Inhale 2 puffs into the lungs 2 (two) times daily.   buPROPion 300 MG 24 hr tablet Commonly known as: WELLBUTRIN XL Take 300 mg by mouth daily.   clonazePAM 1 MG tablet Commonly known as: KLONOPIN Take 1 mg by mouth 3 (three) times daily.   colesevelam 625 MG tablet Commonly known as: WELCHOL Take 1,875 mg by mouth 2 (two) times daily.   divalproex 500 MG 24 hr tablet Commonly known as: DEPAKOTE ER Take 500 mg by mouth 2 (two)  times daily.   fluPHENAZine 5 MG tablet Commonly known as: PROLIXIN Take 5 mg by mouth 2 (two) times daily. Pt also takes daily as needed for psychosis/agitation.   insulin glargine 100 UNIT/ML injection Commonly known as: LANTUS Inject 30 Units into the skin at bedtime.   Invega Trinza 819 MG/2.63ML Susy injection Generic drug: paliperidone Palmitate ER Inject 819 mg into the muscle every 30 (thirty) days.   Invokamet 50-1000 MG Tabs Generic drug: Canagliflozin-metFORMIN HCl Take 1 tablet by mouth 2  (two) times daily with a meal.   Linzess 145 MCG Caps capsule Generic drug: linaclotide Take 145 mcg by mouth daily.   Namzaric 28-10 MG Cp24 Generic drug: Memantine HCl-Donepezil HCl Take 1 capsule by mouth at bedtime.   pantoprazole 40 MG tablet Commonly known as: PROTONIX Take 1 tablet (40 mg total) by mouth 2 (two) times daily.   pioglitazone 45 MG tablet Commonly known as: ACTOS Take 45 mg by mouth daily.   polyethylene glycol 17 g packet Commonly known as: MIRALAX / GLYCOLAX Take 17 g by mouth daily.   pravastatin 40 MG tablet Commonly known as: PRAVACHOL Take 40 mg by mouth at bedtime.   QUEtiapine 400 MG tablet Commonly known as: SEROQUEL Take 600 mg by mouth at bedtime.   sitaGLIPtin 100 MG tablet Commonly known as: JANUVIA Take 100 mg by mouth daily.   Synjardy 12.01-999 MG Tabs Generic drug: Empagliflozin-metFORMIN HCl Take 1 tablet by mouth 2 (two) times daily.   tiotropium 18 MCG inhalation capsule Commonly known as: SPIRIVA Place 18 mcg into inhaler and inhale daily.   traZODone 100 MG tablet Commonly known as: DESYREL Take 200 mg by mouth at bedtime.   Vitamin D (Ergocalciferol) 1.25 MG (50000 UNIT) Caps capsule Commonly known as: DRISDOL Take 50,000 Units by mouth every 7 (seven) days.     Relevant Imaging Results:  Relevant Lab Results:   Additional Information SS#: [id number]  Trenton Founds, RN

## 2020-11-13 NOTE — Plan of Care (Signed)
Continuing with plan of care.  Patient continues to refuse care and medications.  MD aware.

## 2020-11-13 NOTE — Progress Notes (Signed)
Mobility Specialist - Progress Note   11/13/20 1200  Mobility  Activity Ambulated in hall  Level of Assistance Contact guard assist, steadying assist  Assistive Device None  Distance Ambulated (ft) 320 ft  Mobility Response Tolerated well  Mobility performed by Mobility specialist  $Mobility charge 1 Mobility    Pre-mobility: 61 HR, 98% SpO2 Post-mobility: 109 HR, 98% SpO2   Pt was lying in bed upon arrival utilizing room air. Pt agreed to session. Pt Aox1 to self only. Pt reports he knows today is Saturday, but does not know the month or year. Pt denied nausea and fatigue, but does c/o pain in "rib area, just below chest". Nurse notified. Per discussion with RN, pt has been refusing all medication this date. Pt is able to get EOB with independence, however; pt is very impulsive and requires cueing to slow speed. Pt with decreased safety awareness. Pt denied dizziness throughout session. Pt stood to RW and mobility provided gown for out of room activity, assistance d/t IV lines in BUE. Pt ambulated 320' in hallway without use of AD, but does require CGA follow for safety. Pt denied SOB throughout activity. Still requires cues to decrease speed, but does carry over commands well. Overall, pt tolerated session well. Pt was left in bed with all needs in reach and bed alarm set. Nurse notified of performance.    Johnny [name] Mobility Specialist 11/13/20, 12:20 PM

## 2020-11-13 NOTE — Discharge Summary (Signed)
Johnny [name]:[id number] DOB: 25-May-1956 DOA: 11/11/2020  PCP: Franciso Bend, NP  Admit date: 11/11/2020 Discharge date: 11/13/2020  Admitted From: Home Disposition: Home  Recommendations for Outpatient Follow-up:  1. Follow up with PCP in 1 week 2. Please obtain BMP/CBC in one week 3. GI Dr. Servando Snare in 2 weeks     Discharge Condition:Stable CODE STATUS: Full Diet recommendation: Carb modified   Brief/Interim Summary: Per Johnny [name] is a 65 y.o. African-American male with medical history significant for hypertension, dyslipidemia, type 2 diabetes mellitus, GERD and schizophrenia, who presented to the emergency room with acute onset of intractable vomiting over the last couple of days with poor p.o. intake.  This is associated with burning epigastric and substernal pain.  No diarrhea or melena or bright red blood per rectum.  He admitted however to coffee-ground emesis. GI was consulted. Had a CT scan abdomen and chest with results below.  Status post EGD on 3/4 by Dr. Servando Snare. Please note, pt refused his care and medications , blood work today.   1. Uncontrolled type 2 diabetes mellitus with ketoticnonacidotic hyperglycemia. AG improved BG levels stable Tolerating food A1C 7.7  2. Intractable nausea and vomiting like secondary to acute esophagitis leading to #1with possible mild upper GI bleeding. Was started on IV PPI GI was consulted, status post EGD-found with LA grade D reflux esophagitis with no bleeding.  Gastritis.  Normal examined duodenum.  He was started on full liquid diet and advance feeding as tolerated. GI recommended PPI p.o. twice daily   3. Acute kidney injury likely prerenal secondary to recurrent nausea and vomiting and dehydration. Improved with IV fluids Cr 1.34>>1.05 Chlorthalidone/HCTZ were held   4. Essential hypertension. Stable Continue ACE inhibitor Add HCTZ as outpatient renal functions remain stable    5.  Dyslipidemia. On statin  6. Schizophrenia, anxiety and depression. Continue outpatient medications  Discharge Diagnoses:  Active Problems:   Uncontrolled type 2 diabetes mellitus (HCC)   Intractable vomiting   Hematemesis with nausea   Abnormal CT scan, esophagus   Nausea   GIB (gastrointestinal bleeding)    Discharge Instructions  Discharge Instructions    Diet - low sodium heart healthy   Complete by: As directed    Discharge instructions   Complete by: As directed    Follow up with pcp in one week Follow up with GI in 2 weeks No NSAIDS   Increase activity slowly   Complete by: As directed      Allergies as of 11/13/2020      Reactions   Fish Allergy Rash   Shellfish Allergy Rash      Medication List    STOP taking these medications   aspirin 81 MG chewable tablet   hydrochlorothiazide 12.5 MG capsule Commonly known as: MICROZIDE   omeprazole 40 MG capsule Commonly known as: PRILOSEC   ondansetron 4 MG disintegrating tablet Commonly known as: Zofran ODT     TAKE these medications   albuterol 108 (90 Base) MCG/ACT inhaler Commonly known as: VENTOLIN HFA Inhale 2 puffs into the lungs every 4 (four) hours as needed for wheezing or shortness of breath.   ammonium lactate 12 % cream Commonly known as: AMLACTIN Apply 1 g topically daily.   benazepril 20 MG tablet Commonly known as: LOTENSIN Take 20 mg by mouth daily.   benztropine 1 MG tablet Commonly known as: COGENTIN Take 1 mg by mouth 2 (two) times daily.   Breztri Aerosphere 160-9-4.8 MCG/ACT Aero Generic drug:  Budeson-Glycopyrrol-Formoterol Inhale 2 puffs into the lungs 2 (two) times daily.   buPROPion 300 MG 24 hr tablet Commonly known as: WELLBUTRIN XL Take 300 mg by mouth daily.   clonazePAM 1 MG tablet Commonly known as: KLONOPIN Take 1 mg by mouth 3 (three) times daily.   colesevelam 625 MG tablet Commonly known as: WELCHOL Take 1,875 mg by mouth 2 (two) times daily.    divalproex 500 MG 24 hr tablet Commonly known as: DEPAKOTE ER Take 500 mg by mouth 2 (two) times daily.   fluPHENAZine 5 MG tablet Commonly known as: PROLIXIN Take 5 mg by mouth 2 (two) times daily. Pt also takes daily as needed for psychosis/agitation.   insulin glargine 100 UNIT/ML injection Commonly known as: LANTUS Inject 30 Units into the skin at bedtime.   Invega Trinza 819 MG/2.63ML Susy injection Generic drug: paliperidone Palmitate ER Inject 819 mg into the muscle every 30 (thirty) days.   Invokamet 50-1000 MG Tabs Generic drug: Canagliflozin-metFORMIN HCl Take 1 tablet by mouth 2 (two) times daily with a meal.   Linzess 145 MCG Caps capsule Generic drug: linaclotide Take 145 mcg by mouth daily.   Namzaric 28-10 MG Cp24 Generic drug: Memantine HCl-Donepezil HCl Take 1 capsule by mouth at bedtime.   pantoprazole 40 MG tablet Commonly known as: PROTONIX Take 1 tablet (40 mg total) by mouth 2 (two) times daily.   pioglitazone 45 MG tablet Commonly known as: ACTOS Take 45 mg by mouth daily.   polyethylene glycol 17 g packet Commonly known as: MIRALAX / GLYCOLAX Take 17 g by mouth daily.   pravastatin 40 MG tablet Commonly known as: PRAVACHOL Take 40 mg by mouth at bedtime.   QUEtiapine 400 MG tablet Commonly known as: SEROQUEL Take 600 mg by mouth at bedtime.   sitaGLIPtin 100 MG tablet Commonly known as: JANUVIA Take 100 mg by mouth daily.   Synjardy 12.01-999 MG Tabs Generic drug: Empagliflozin-metFORMIN HCl Take 1 tablet by mouth 2 (two) times daily.   tiotropium 18 MCG inhalation capsule Commonly known as: SPIRIVA Place 18 mcg into inhaler and inhale daily.   traZODone 100 MG tablet Commonly known as: DESYREL Take 200 mg by mouth at bedtime.   Vitamin D (Ergocalciferol) 1.25 MG (50000 UNIT) Caps capsule Commonly known as: DRISDOL Take 50,000 Units by mouth every 7 (seven) days.       Follow-up Information    Rexene Agent B, NP  Follow up in 1 week(s).   Specialty: Nurse Practitioner Contact information: [address] [place] [address] [phone]        Midge Minium, MD Follow up in 2 week(s).   Specialty: Gastroenterology Contact information: [address] [place]  [place] [address] [phone]              Allergies  Allergen Reactions  . Fish Allergy Rash  . Shellfish Allergy Rash    Consultations:  GI   Procedures/Studies: DG Chest 2 View  Result Date: 11/10/2020 CLINICAL DATA:  Midsternal chest pain and shortness of breath, belching EXAM: CHEST - 2 VIEW COMPARISON:  06/17/2020 FINDINGS: Frontal and lateral views of the chest demonstrate a stable cardiac silhouette. Chronic areas of scarring and atelectasis are seen, greatest at the right lung base. Trace right pleural effusion versus pleural thickening again noted, stable. No acute airspace disease. No pneumothorax. No acute bony abnormalities. IMPRESSION: 1. Chronic areas of scarring and atelectasis, greatest at the right lung base. 2. Trace right pleural effusion versus pleural thickening, stable. 3.  No acute intrathoracic process. Electronically Signed   By: Sharlet SalinaMichael  Brown M.D.   On: 11/10/2020 18:03   CT Chest Wo Contrast  Result Date: 11/11/2020 CLINICAL DATA:  Vomiting. EXAM: CT CHEST WITHOUT CONTRAST TECHNIQUE: Multidetector CT imaging of the chest was performed following the standard protocol without IV contrast. COMPARISON:  June 15, 2020 FINDINGS: Cardiovascular: There is marked severity calcification of the aortic arch. Normal heart size. No pericardial effusion. Mediastinum/Nodes: No enlarged mediastinal or axillary lymph nodes. The thyroid gland and trachea demonstrate no significant findings. Moderate to marked severity diffuse esophageal wall thickening is noted. This involves the mid and distal portion of the esophagus. Lungs/Pleura: There is mild emphysematous lung disease. Mild, stable linear scarring and/or atelectasis  is seen within the anterior aspect of the right upper lobe. Stable moderate severity scarring and/or round atelectasis is seen within the posterior aspect of the right lung base. A mild area of stable linear scarring is seen along the posteromedial aspect of the right lung base the (axial CT images 120 through 127, CT series number 3). There is a small left pleural effusion. No pneumothorax is identified. Upper Abdomen: Multiple surgical clips are seen in the gallbladder fossa. A 3.2 cm x 2.4 cm low-attenuation left adrenal mass is seen. A 1.9 cm x 0.7 cm low-attenuation right adrenal mass is also noted. Musculoskeletal: No chest wall mass or suspicious bone lesions identified. IMPRESSION: 1. Stable moderate severity right basilar scarring and/or rounded atelectasis with chronic changes within the posterior medial aspect of the right lung base. 2. Esophageal wall thickening which may represent moderate to marked severity esophagitis. Sequelae associated with an underlying neoplasm cannot be excluded. 3.   Small left pleural effusion. 4. Bilateral adrenal adenomas. Emphysema (ICD10-J43.9). Electronically Signed   By: Aram Candelahaddeus  Houston M.D.   On: 11/11/2020 20:39   CT ABDOMEN PELVIS W CONTRAST  Result Date: 11/11/2020 CLINICAL DATA:  Vomiting. EXAM: CT ABDOMEN AND PELVIS WITH CONTRAST TECHNIQUE: Multidetector CT imaging of the abdomen and pelvis was performed using the standard protocol following bolus administration of intravenous contrast. CONTRAST:  100mL OMNIPAQUE IOHEXOL 300 MG/ML  SOLN COMPARISON:  November 15, 2010 FINDINGS: Lower chest: Moderate severity atelectasis and/or infiltrate is seen within the right lung base. There is a small right pleural effusion. A thin (approximately 2 mm thick) linear area of air attenuation is seen along the medial aspect of the right lung base (axial CT images 4 through 7, CT series number 4). Hepatobiliary: There is diffuse fatty infiltration of the liver parenchyma. No focal  liver abnormality is seen. Status post cholecystectomy. No biliary dilatation. Pancreas: Unremarkable. No pancreatic ductal dilatation or surrounding inflammatory changes. Spleen: Normal in size without focal abnormality. Adrenals/Urinary Tract: A stable 3.3 cm x 2.2 cm heterogeneous left adrenal mass is noted. A stable 1.4 cm x 0.6 cm low-attenuation right adrenal mass is also seen. Kidneys are normal, without renal calculi or hydronephrosis. 1.0 cm and 0.7 cm cysts are seen within the lateral aspect of the mid right kidney. A 2.0 cm x 1.6 cm simple cyst is also noted within the posterolateral aspect of the mid left kidney. Bladder is unremarkable. Stomach/Bowel: There is moderate to marked severity thickening of the visualized portion of the distal esophagus. Stomach is within normal limits. Appendix appears normal. A large amount of stool is seen throughout the colon. No evidence of bowel wall thickening, distention, or inflammatory changes. Vascular/Lymphatic: Aortic atherosclerosis. No enlarged abdominal or pelvic lymph nodes. Reproductive: Prostate is unremarkable. Other:  No abdominal wall hernia or abnormality. No abdominopelvic ascites or intra-abdominal free air. Musculoskeletal: No acute or significant osseous findings. IMPRESSION: 1. Findings worrisome for the presence of a small pneumothorax along the posteromedial aspect of the right lung base. Correlation with chest CT is recommended. 2. Moderate severity right basilar atelectasis and/or infiltrate with a small right pleural effusion. 3. Findings which may represent moderate to marked severity esophagitis within the visualized portion of the distal esophagus. Sequelae associated with an underlying neoplastic process cannot be excluded. 4. Evidence of prior cholecystectomy. 5. Findings likely consistent with stable bilateral adrenal adenomas. 6. Aortic atherosclerosis. Aortic Atherosclerosis (ICD10-I70.0). Electronically Signed   By: Aram Candela  M.D.   On: 11/11/2020 19:26       Subjective: Feels better. No n/v/abd pain  Discharge Exam: Vitals:   11/13/20 0605 11/13/20 0858  BP: (!) 102/56 (!) 138/56  Pulse: 68 67  Resp: (!) 22 17  Temp: 98.7 F (37.1 C)   SpO2: 97% 97%   Vitals:   11/12/20 2000 11/12/20 2311 11/13/20 0605 11/13/20 0858  BP: 139/80 (!) 124/53 (!) 102/56 (!) 138/56  Pulse: 78 76 68 67  Resp: 16 18 (!) 22 17  Temp: (!) 100.7 F (38.2 C) 98 F (36.7 C) 98.7 F (37.1 C)   TempSrc: Oral  Oral   SpO2: 97% 98% 97% 97%  Weight:      Height:        General: Pt is alert, awake, not in acute distress Cardiovascular: RRR, S1/S2 +, no rubs, no gallops Respiratory: CTA bilaterally, no wheezing, no rhonchi Abdominal: Soft, NT, ND, bowel sounds + Extremities: no edema, no cyanosis    The results of significant diagnostics from this hospitalization (including imaging, microbiology, ancillary and laboratory) are listed below for reference.     Microbiology: Recent Results (from the past 240 hour(s))  Resp Panel by RT-PCR (Flu A&B, Covid)     Status: None   Collection Time: 11/11/20  9:37 PM   Specimen: Nasopharyngeal(NP) swabs in vial transport medium  Result Value Ref Range Status   SARS Coronavirus 2 by RT PCR NEGATIVE NEGATIVE Final    Comment: (NOTE) SARS-CoV-2 target nucleic acids are NOT DETECTED.  The SARS-CoV-2 RNA is generally detectable in upper respiratory specimens during the acute phase of infection. The lowest concentration of SARS-CoV-2 viral copies this assay can detect is 138 copies/mL. A negative result does not preclude SARS-Cov-2 infection and should not be used as the sole basis for treatment or other patient management decisions. A negative result may occur with  improper specimen collection/handling, submission of specimen other than nasopharyngeal swab, presence of viral mutation(s) within the areas targeted by this assay, and inadequate number of viral copies(<138  copies/mL). A negative result must be combined with clinical observations, patient history, and epidemiological information. The expected result is Negative.  Fact Sheet for Patients:  BloggerCourse.com  Fact Sheet for Healthcare Providers:  SeriousBroker.it  This test is no t yet approved or cleared by the [place] FDA and  has been authorized for detection and/or diagnosis of SARS-CoV-2 by FDA under an Emergency Use Authorization (EUA). This EUA will remain  in effect (meaning this test can be used) for the duration of the COVID-19 declaration under Section 564(b)(1) of the Act, 21 U.S.C.section 360bbb-3(b)(1), unless the authorization is terminated  or revoked sooner.       Influenza A by PCR NEGATIVE NEGATIVE Final   Influenza B by PCR NEGATIVE NEGATIVE Final  Comment: (NOTE) The Xpert Xpress SARS-CoV-2/FLU/RSV plus assay is intended as an aid in the diagnosis of influenza from Nasopharyngeal swab specimens and should not be used as a sole basis for treatment. Nasal washings and aspirates are unacceptable for Xpert Xpress SARS-CoV-2/FLU/RSV testing.  Fact Sheet for Patients: BloggerCourse.com  Fact Sheet for Healthcare Providers: SeriousBroker.it  This test is not yet approved or cleared by the [place] FDA and has been authorized for detection and/or diagnosis of SARS-CoV-2 by FDA under an Emergency Use Authorization (EUA). This EUA will remain in effect (meaning this test can be used) for the duration of the COVID-19 declaration under Section 564(b)(1) of the Act, 21 U.S.C. section 360bbb-3(b)(1), unless the authorization is terminated or revoked.  Performed at [facility], [address] [place], [place] [address]      Labs: BNP (last 3 results) No results for input(s): BNP in the last 8760 hours. Basic Metabolic Panel: Recent Labs  Lab  11/10/20 1640 11/11/20 1637 11/12/20 0505  NA 134* 134* 138  K 4.1 4.5 4.3  CL 93* 90* 99  CO2 GLUCOSE 157* 180* 114*  BUN 34* 39* 30*  CREATININE 1.17 1.34* 1.05  CALCIUM 10.2 10.1 8.6*   Liver Function Tests: Recent Labs  Lab 11/11/20 1637  AST 14*  ALT 13  ALKPHOS 81  BILITOT 1.3*  PROT 7.9  ALBUMIN 4.2   Recent Labs  Lab 11/11/20 1637  LIPASE 74*   No results for input(s): AMMONIA in the last 168 hours. CBC: Recent Labs  Lab 11/10/20 1640 11/11/20 1637 11/12/20 0505  WBC 12.0* 14.7* 11.1*  HGB 13.4 14.6 12.6*  HCT 39.5 43.3 37.8*  MCV 88.0 87.3 88.9  PLT 233 257 236   Cardiac Enzymes: No results for input(s): CKTOTAL, CKMB, CKMBINDEX, TROPONINI in the last 168 hours. BNP: Invalid input(s): POCBNP CBG: Recent Labs  Lab 11/12/20 0134 11/12/20 0759 11/12/20 1127 11/12/20 1624 11/12/20 2145  GLUCAP 103* 115* 130* 170* 116*   D-Dimer No results for input(s): DDIMER in the last 72 hours. Hgb A1c Recent Labs    11/11/20 1637  HGBA1C 7.7*   Lipid Profile No results for input(s): CHOL, HDL, LDLCALC, TRIG, CHOLHDL, LDLDIRECT in the last 72 hours. Thyroid function studies No results for input(s): TSH, T4TOTAL, T3FREE, THYROIDAB in the last 72 hours.  Invalid input(s): FREET3 Anemia work up No results for input(s): VITAMINB12, FOLATE, FERRITIN, TIBC, IRON, RETICCTPCT in the last 72 hours. Urinalysis    Component Value Date/Time   COLORURINE YELLOW (A) 11/11/2020 1900   APPEARANCEUR CLEAR (A) 11/11/2020 1900   LABSPEC 1.028 11/11/2020 1900   PHURINE 5.0 11/11/2020 1900   GLUCOSEU >=500 (A) 11/11/2020 1900   HGBUR NEGATIVE 11/11/2020 1900   HGBUR negative 06/10/2008 0920   BILIRUBINUR NEGATIVE 11/11/2020 1900   KETONESUR 20 (A) 11/11/2020 1900   PROTEINUR NEGATIVE 11/11/2020 1900   UROBILINOGEN 1.0 08/04/2008 1322   NITRITE NEGATIVE 11/11/2020 1900   LEUKOCYTESUR NEGATIVE 11/11/2020 1900   Sepsis Labs Invalid input(s):  PROCALCITONIN,  WBC,  LACTICIDVEN Microbiology Recent Results (from the past 240 hour(s))  Resp Panel by RT-PCR (Flu A&B, Covid)     Status: None   Collection Time: 11/11/20  9:37 PM   Specimen: Nasopharyngeal(NP) swabs in vial transport medium  Result Value Ref Range Status   SARS Coronavirus 2 by RT PCR NEGATIVE NEGATIVE Final    Comment: (NOTE) SARS-CoV-2 target nucleic acids are NOT DETECTED.  The SARS-CoV-2 RNA is generally  detectable in upper respiratory specimens during the acute phase of infection. The lowest concentration of SARS-CoV-2 viral copies this assay can detect is 138 copies/mL. A negative result does not preclude SARS-Cov-2 infection and should not be used as the sole basis for treatment or other patient management decisions. A negative result may occur with  improper specimen collection/handling, submission of specimen other than nasopharyngeal swab, presence of viral mutation(s) within the areas targeted by this assay, and inadequate number of viral copies(<138 copies/mL). A negative result must be combined with clinical observations, patient history, and epidemiological information. The expected result is Negative.  Fact Sheet for Patients:  BloggerCourse.com  Fact Sheet for Healthcare Providers:  SeriousBroker.it  This test is no t yet approved or cleared by the [place] FDA and  has been authorized for detection and/or diagnosis of SARS-CoV-2 by FDA under an Emergency Use Authorization (EUA). This EUA will remain  in effect (meaning this test can be used) for the duration of the COVID-19 declaration under Section 564(b)(1) of the Act, 21 U.S.C.section 360bbb-3(b)(1), unless the authorization is terminated  or revoked sooner.       Influenza A by PCR NEGATIVE NEGATIVE Final   Influenza B by PCR NEGATIVE NEGATIVE Final    Comment: (NOTE) The Xpert Xpress SARS-CoV-2/FLU/RSV plus assay is intended as  an aid in the diagnosis of influenza from Nasopharyngeal swab specimens and should not be used as a sole basis for treatment. Nasal washings and aspirates are unacceptable for Xpert Xpress SARS-CoV-2/FLU/RSV testing.  Fact Sheet for Patients: BloggerCourse.com  Fact Sheet for Healthcare Providers: SeriousBroker.it  This test is not yet approved or cleared by the [place] FDA and has been authorized for detection and/or diagnosis of SARS-CoV-2 by FDA under an Emergency Use Authorization (EUA). This EUA will remain in effect (meaning this test can be used) for the duration of the COVID-19 declaration under Section 564(b)(1) of the Act, 21 U.S.C. section 360bbb-3(b)(1), unless the authorization is terminated or revoked.  Performed at [facility], [address], [place], [place] [address]      Time coordinating discharge: Over 30 minutes  SIGNED:   Lynn Ito, MD  [facility] 11/13/2020, 12:06 PM Pager   If 7PM-7AM, please contact night-coverage [email or web address] Password [email or web address]

## 2020-11-13 NOTE — Progress Notes (Signed)
Melodie Bouillon, MD [address], [address], [place], [place], [address] [address], [place], [place], [address] Phone: [phone]  Fax: [phone]   Subjective: Patient tolerating oral diet without difficulty.  No abdominal pain, nausea or vomiting   Objective: Exam: Vital signs in last 24 hours: Vitals:   11/12/20 2000 11/12/20 2311 11/13/20 0605 11/13/20 0858  BP: 139/80 (!) 124/53 (!) 102/56 (!) 138/56  Pulse: 78 76 68 67  Resp: 16 18 (!) 22 17  Temp: (!) 100.7 F (38.2 C) 98 F (36.7 C) 98.7 F (37.1 C)   TempSrc: Oral  Oral   SpO2: 97% 98% 97% 97%  Weight:      Height:       Weight change:   Intake/Output Summary (Last 24 hours) at 11/13/2020 1020 Last data filed at 11/13/2020 7026 Gross per 24 hour  Intake 1822.48 ml  Output 2300 ml  Net -477.52 ml    General: No acute distress, AAO x3 Abd: Soft, NT/ND, No HSM Skin: Warm, no rashes Neck: Supple, Trachea midline   Lab Results: Lab Results  Component Value Date   WBC 11.1 (H) 11/12/2020   HGB 12.6 (L) 11/12/2020   HCT 37.8 (L) 11/12/2020   MCV 88.9 11/12/2020   PLT 236 11/12/2020   Micro Results: Recent Results (from the past 240 hour(s))  Resp Panel by RT-PCR (Flu A&B, Covid)     Status: None   Collection Time: 11/11/20  9:37 PM   Specimen: Nasopharyngeal(NP) swabs in vial transport medium  Result Value Ref Range Status   SARS Coronavirus 2 by RT PCR NEGATIVE NEGATIVE Final    Comment: (NOTE) SARS-CoV-2 target nucleic acids are NOT DETECTED.  The SARS-CoV-2 RNA is generally detectable in upper respiratory specimens during the acute phase of infection. The lowest concentration of SARS-CoV-2 viral copies this assay can detect is 138 copies/mL. A negative result does not preclude SARS-Cov-2 infection and should not be used as the sole basis for treatment or other patient management decisions. A negative result may occur with  improper specimen collection/handling, submission of  specimen other than nasopharyngeal swab, presence of viral mutation(s) within the areas targeted by this assay, and inadequate number of viral copies(<138 copies/mL). A negative result must be combined with clinical observations, patient history, and epidemiological information. The expected result is Negative.  Fact Sheet for Patients:  BloggerCourse.com  Fact Sheet for Healthcare Providers:  SeriousBroker.it  This test is no t yet approved or cleared by the [place] FDA and  has been authorized for detection and/or diagnosis of SARS-CoV-2 by FDA under an Emergency Use Authorization (EUA). This EUA will remain  in effect (meaning this test can be used) for the duration of the COVID-19 declaration under Section 564(b)(1) of the Act, 21 U.S.C.section 360bbb-3(b)(1), unless the authorization is terminated  or revoked sooner.       Influenza A by PCR NEGATIVE NEGATIVE Final   Influenza B by PCR NEGATIVE NEGATIVE Final    Comment: (NOTE) The Xpert Xpress SARS-CoV-2/FLU/RSV plus assay is intended as an aid in the diagnosis of influenza from Nasopharyngeal swab specimens and should not be used as a sole basis for treatment. Nasal washings and aspirates are unacceptable for Xpert Xpress SARS-CoV-2/FLU/RSV testing.  Fact Sheet for Patients: BloggerCourse.com  Fact Sheet for Healthcare Providers: SeriousBroker.it  This test is not yet approved or cleared by the [place] FDA and has been authorized for detection and/or diagnosis of SARS-CoV-2 by FDA under an Emergency  Use Authorization (EUA). This EUA will remain in effect (meaning this test can be used) for the duration of the COVID-19 declaration under Section 564(b)(1) of the Act, 21 U.S.C. section 360bbb-3(b)(1), unless the authorization is terminated or revoked.  Performed at [facility], [address], [place], [place] [address]    Studies/Results: CT Chest Wo Contrast  Result Date: 11/11/2020 CLINICAL DATA:  Vomiting. EXAM: CT CHEST WITHOUT CONTRAST TECHNIQUE: Multidetector CT imaging of the chest was performed following the standard protocol without IV contrast. COMPARISON:  June 15, 2020 FINDINGS: Cardiovascular: There is marked severity calcification of the aortic arch. Normal heart size. No pericardial effusion. Mediastinum/Nodes: No enlarged mediastinal or axillary lymph nodes. The thyroid gland and trachea demonstrate no significant findings. Moderate to marked severity diffuse esophageal wall thickening is noted. This involves the mid and distal portion of the esophagus. Lungs/Pleura: There is mild emphysematous lung disease. Mild, stable linear scarring and/or atelectasis is seen within the anterior aspect of the right upper lobe. Stable moderate severity scarring and/or round atelectasis is seen within the posterior aspect of the right lung base. A mild area of stable linear scarring is seen along the posteromedial aspect of the right lung base the (axial CT images 120 through 127, CT series number 3). There is a small left pleural effusion. No pneumothorax is identified. Upper Abdomen: Multiple surgical clips are seen in the gallbladder fossa. A 3.2 cm x 2.4 cm low-attenuation left adrenal mass is seen. A 1.9 cm x 0.7 cm low-attenuation right adrenal mass is also noted. Musculoskeletal: No chest wall mass or suspicious bone lesions identified. IMPRESSION: 1. Stable moderate severity right basilar scarring and/or rounded atelectasis with chronic changes within the posterior medial aspect of the right lung base. 2. Esophageal wall thickening which may represent moderate to marked severity esophagitis. Sequelae associated with an underlying neoplasm cannot be excluded. 3.   Small left pleural effusion. 4. Bilateral adrenal adenomas. Emphysema (ICD10-J43.9). Electronically Signed   By: Aram Candela M.D.   On: 11/11/2020 20:39   CT ABDOMEN PELVIS W CONTRAST  Result Date: 11/11/2020 CLINICAL DATA:  Vomiting. EXAM: CT ABDOMEN AND PELVIS WITH CONTRAST TECHNIQUE: Multidetector CT imaging of the abdomen and pelvis was performed using the standard protocol following bolus administration of intravenous contrast. CONTRAST:  OMNIPAQUE IOHEXOL 300 MG/ML  SOLN COMPARISON:  November 15, 2010 FINDINGS: Lower chest: Moderate severity atelectasis and/or infiltrate is seen within the right lung base. There is a small right pleural effusion. A thin (approximately 2 mm thick) linear area of air attenuation is seen along the medial aspect of the right lung base (axial CT images 4 through 7, CT series number 4). Hepatobiliary: There is diffuse fatty infiltration of the liver parenchyma. No focal liver abnormality is seen. Status post cholecystectomy. No biliary dilatation. Pancreas: Unremarkable. No pancreatic ductal dilatation or surrounding inflammatory changes. Spleen: Normal in size without focal abnormality. Adrenals/Urinary Tract: A stable 3.3 cm x 2.2 cm heterogeneous left adrenal mass is noted. A stable 1.4 cm x 0.6 cm low-attenuation right adrenal mass is also seen. Kidneys are normal, without renal calculi or hydronephrosis. 1.0 cm and 0.7 cm cysts are seen within the lateral aspect of the mid right kidney. A 2.0 cm x 1.6 cm simple cyst is also noted within the posterolateral aspect of the mid left kidney. Bladder is unremarkable. Stomach/Bowel: There is moderate to marked severity thickening of the visualized portion of the distal esophagus. Stomach is within normal limits. Appendix appears  normal. A large amount of stool is seen throughout the colon. No evidence of bowel wall thickening, distention, or inflammatory changes. Vascular/Lymphatic: Aortic atherosclerosis. No enlarged abdominal or pelvic lymph nodes. Reproductive: Prostate is unremarkable. Other: No abdominal wall hernia or abnormality. No  abdominopelvic ascites or intra-abdominal free air. Musculoskeletal: No acute or significant osseous findings. IMPRESSION: 1. Findings worrisome for the presence of a small pneumothorax along the posteromedial aspect of the right lung base. Correlation with chest CT is recommended. 2. Moderate severity right basilar atelectasis and/or infiltrate with a small right pleural effusion. 3. Findings which may represent moderate to marked severity esophagitis within the visualized portion of the distal esophagus. Sequelae associated with an underlying neoplastic process cannot be excluded. 4. Evidence of prior cholecystectomy. 5. Findings likely consistent with stable bilateral adrenal adenomas. 6. Aortic atherosclerosis. Aortic Atherosclerosis (ICD10-I70.0). Electronically Signed   By: Aram Candela M.D.   On: 11/11/2020 19:26   Medications:  Scheduled Meds: . ammonium lactate  1 application Topical Daily  . benazepril  20 mg Oral Daily  . benztropine  1 mg Oral BID  . buPROPion  300 mg Oral Daily  . clonazePAM  1 mg Oral TID  . colesevelam  1,875 mg Oral BID  . divalproex  500 mg Oral BID  . memantine  28 mg Oral QHS   And  . donepezil  10 mg Oral QHS  . fluPHENAZine  5 mg Oral BID  . fluticasone furoate-vilanterol  1 puff Inhalation Daily  . linaclotide  145 mcg Oral Daily  . linagliptin  5 mg Oral Daily  . [START ON 11/15/2020] pantoprazole  40 mg Intravenous Q12H  . polyethylene glycol  17 g Oral Daily  . pravastatin  40 mg Oral q1800  . QUEtiapine  600 mg Oral QHS  . tiotropium  18 mcg Inhalation Daily  . traZODone  200 mg Oral QHS  . [START ON 11/14/2020] Vitamin D (Ergocalciferol)  50,000 Units Oral Q7 days   Continuous Infusions: . lactated ringers 125 mL/hr at 11/13/20 0032  . pantoprozole (PROTONIX) infusion 8 mg/hr (11/13/20 0115)   PRN Meds:.acetaminophen **OR** acetaminophen, albuterol, dextrose, labetalol, magnesium hydroxide, ondansetron **OR** ondansetron (ZOFRAN) IV,  ondansetron, traZODone   Assessment: Active Problems:   Uncontrolled type 2 diabetes mellitus (HCC)   Intractable vomiting   Hematemesis with nausea   Abnormal CT scan, esophagus   Nausea   GIB (gastrointestinal bleeding)    Plan: Hemoglobin stable No signs of active bleeding  PPI IV twice daily  Continue serial CBCs and transfuse PRN Avoid NSAIDs Maintain 2 large-bore IV lines Please page GI with any acute hemodynamic changes, or signs of active GI bleeding  Keep head of bed elevated to 30 degrees due to patient's esophagitis seen on endoscopy   LOS: 1 day   Melodie Bouillon, MD 11/13/2020, 10:20 AM

## 2020-11-13 NOTE — Plan of Care (Signed)
Patient continues to refuse medications and care.  Patient tolerated his lunch and has had no further episodes of vomiting or complaints of nausea.  Group home contacted and notified that the patient has been discharged and is ready to be picked up, discharge paperwork reviewed as well.  Patient guardian, Bevelyn Ngo, also in to see patient today and updated on care.

## 2020-11-13 NOTE — Progress Notes (Signed)
Patient refused to have his blood sugar checked.

## 2020-11-13 NOTE — Progress Notes (Signed)
This nurse and Dr. Marylu Lund spoke with patient regarding having his blood drawn, educating on the importance of lab work for proper treatment, patient refused to have his blood drawn.

## 2020-11-13 NOTE — TOC Progression Note (Addendum)
Transition of Care [facility]) - Progression Note    Patient Details  Name: ZAKARIA SEDOR MRN: [id number] Date of Birth: [birth year]  [facility] CM/SW Contact  Trenton Founds, RN Phone Number: 11/13/2020, 12:17 PM  Clinical Narrative:   RNCM attempted to reach out to guardian regarding discharge without success. VM left for return call.   2:00pm: RNCM has spoken with both Lamar Laundry patient's guardian as well as Marcelino Duster her supervisor regarding patient's discharge. Lamar Laundry is on her way to hospital to see patient at this time. Once they clear him from their standpoint facility can be contacted to pick patient up. RNCM completed FL2 and this can be printed and placed in discharge paperwork.          Expected Discharge Plan and Services           Expected Discharge Date: 11/13/20                                     Social Determinants of Health (SDOH) Interventions    Readmission Risk Interventions Readmission Risk Prevention Plan 06/19/2020  PCP or Specialist Appt within 3-5 Days Complete  HRI or Home Care Consult Complete  Palliative Care Screening Not Applicable  Medication Review (RN Care Manager) Complete  Some recent data might be hidden

## 2020-11-13 NOTE — Progress Notes (Signed)
Patient has refused all oral medication.  IV medication and fluids infusing. Limited ability ability to interact with patient.  Vital signs obtained but patient refused to have temperature taken during AM vitals.  Will continue to monitor and offer medication as they are due.

## 2020-11-15 ENCOUNTER — Encounter: Payer: Self-pay | Admitting: Gastroenterology

## 2020-11-22 ENCOUNTER — Encounter: Payer: Self-pay | Admitting: Emergency Medicine

## 2020-11-22 ENCOUNTER — Emergency Department
Admission: EM | Admit: 2020-11-22 | Discharge: 2020-11-23 | Disposition: A | Discharge status: Home / Self Care | Payer: Medicare Other | Source: Home / Self Care | Attending: Emergency Medicine | Admitting: Emergency Medicine

## 2020-11-22 ENCOUNTER — Other Ambulatory Visit: Payer: Self-pay

## 2020-11-22 DIAGNOSIS — R0602 Shortness of breath: Secondary | ICD-10-CM | POA: Insufficient documentation

## 2020-11-22 DIAGNOSIS — Z794 Long term (current) use of insulin: Secondary | ICD-10-CM | POA: Insufficient documentation

## 2020-11-22 DIAGNOSIS — F17210 Nicotine dependence, cigarettes, uncomplicated: Secondary | ICD-10-CM | POA: Insufficient documentation

## 2020-11-22 DIAGNOSIS — E119 Type 2 diabetes mellitus without complications: Secondary | ICD-10-CM | POA: Insufficient documentation

## 2020-11-22 DIAGNOSIS — E860 Dehydration: Secondary | ICD-10-CM | POA: Diagnosis not present

## 2020-11-22 DIAGNOSIS — I10 Essential (primary) hypertension: Secondary | ICD-10-CM | POA: Insufficient documentation

## 2020-11-22 DIAGNOSIS — R079 Chest pain, unspecified: Secondary | ICD-10-CM | POA: Insufficient documentation

## 2020-11-22 DIAGNOSIS — R42 Dizziness and giddiness: Secondary | ICD-10-CM | POA: Insufficient documentation

## 2020-11-22 DIAGNOSIS — K5901 Slow transit constipation: Secondary | ICD-10-CM | POA: Diagnosis not present

## 2020-11-22 DIAGNOSIS — Z79899 Other long term (current) drug therapy: Secondary | ICD-10-CM | POA: Insufficient documentation

## 2020-11-22 NOTE — ED Provider Notes (Incomplete)
[facility] Emergency Department Provider Note   ____________________________________________   Event Date/Time   First MD Initiated Contact with Patient 11/22/20 2313     (approximate)  I have reviewed the triage vital signs and the nursing notes.   HISTORY  Chief Complaint Dizziness  Level V caveat: Limited by uncooperative nature  HPI Johnny [name] is a 65 y.o. male brought to the ED from group home with a chief complaint of hypertension, chest pain, dizziness and shortness of breath.  Patient has a history of schizophrenia, hypertension, diabetes, hyperlipidemia.  Staff member states patient was taken off his metoprolol several days ago due to recurrent hypotension.  Group home had been giving patient a low-sodium diet but forgot and patient got a sausage patty today.  Subsequent check of his blood pressure was 161/124.  Staff member also states patient's heart rate has been higher than usual since discontinuation of his metoprolol.  Group home has also noted that patient has been sleeping more than usual.  Patient does endorse chest pain, shortness of breath and dizziness but cannot or will not give further detail.     Past Medical History:  Diagnosis Date  . Anemia   . Clostridium difficile colitis   . Diabetes mellitus without complication (HCC)   . DM (diabetes mellitus) (HCC)   . GERD (gastroesophageal reflux disease)   . Herpes   . Hyperlipidemia   . Hypertension   . Schizophrenia [facility])     Patient Active Problem List   Diagnosis Date Noted  . GIB (gastrointestinal bleeding) 11/12/2020  . Intractable vomiting   . Hematemesis with nausea   . Abnormal CT scan, esophagus   . Nausea   . Uncontrolled type 2 diabetes mellitus (HCC) 11/11/2020  . Bacteremia 06/17/2020  . Acute renal failure (ARF) (HCC) 07/05/2015  . SINUS TACHYCARDIA 06/10/2008  . HYPOTENSION, ORTHOSTATIC 06/10/2008  . Type 2 diabetes mellitus with hyperlipidemia (HCC)  05/19/2008  . HYPOGLYCEMIA 04/27/2008  . HYPERLIPIDEMIA 01/22/2008  . Schizophrenia (HCC) 01/22/2008  . ANXIETY 01/22/2008  . DEPRESSION 01/22/2008  . Essential hypertension 01/22/2008  . MYOCARDIAL INFARCTION, HX OF 01/22/2008  . BRONCHITIS, CHRONIC 01/22/2008  . GERD 01/22/2008    Past Surgical History:  Procedure Laterality Date  . CHOLECYSTECTOMY    . ESOPHAGOGASTRODUODENOSCOPY (EGD) WITH PROPOFOL N/A 11/12/2020   Procedure: ESOPHAGOGASTRODUODENOSCOPY (EGD) WITH PROPOFOL;  Surgeon: Midge Minium, MD;  Location[facility] ENDOSCOPY;  Service: Endoscopy;  Laterality: N/A;    Prior to Admission medications   Medication Sig Start Date End Date Taking? Authorizing Provider  albuterol (PROVENTIL HFA;VENTOLIN HFA) 108 (90 BASE) MCG/ACT inhaler Inhale 2 puffs into the lungs every 4 (four) hours as needed for wheezing or shortness of breath.    [provider]  ammonium lactate (AMLACTIN) 12 % cream Apply 1 g topically daily.     [provider]  benazepril (LOTENSIN) 20 MG tablet Take 20 mg by mouth daily. 06/11/20   [provider]  benztropine (COGENTIN) 1 MG tablet Take 1 mg by mouth 2 (two) times daily.    [provider]  BREZTRI AEROSPHERE 160-9-4.8 MCG/ACT AERO Inhale 2 puffs into the lungs 2 (two) times daily. 05/14/20   [provider]  buPROPion (WELLBUTRIN XL) 300 MG 24 hr tablet Take 300 mg by mouth daily. 06/11/20   [provider]  Canagliflozin-Metformin HCl (INVOKAMET) 50-1000 MG TABS Take 1 tablet by mouth 2 (two) times daily with a meal.    [provider]  clonazePAM (KLONOPIN) 1 MG tablet Take 1 mg by mouth 3 (three) times daily.    [provider]  colesevelam (WELCHOL) 625 MG tablet Take 1,875 mg by mouth 2 (two) times daily.     [provider]  divalproex (DEPAKOTE ER) 500 MG 24 hr tablet Take 500 mg by mouth 2 (two) times daily.     [provider]  fluPHENAZine (PROLIXIN) 5 MG tablet  Take 5 mg by mouth 2 (two) times daily. Pt also takes daily as needed for psychosis/agitation.    [provider]  insulin glargine (LANTUS) 100 UNIT/ML injection Inject 30 Units into the skin at bedtime.    [provider]  INVEGA TRINZA 819 MG/2.625ML injection Inject 819 mg into the muscle every 30 (thirty) days. 06/01/20   [provider]  LINZESS 145 MCG CAPS capsule Take 145 mcg by mouth daily. 06/11/20   [provider]  Memantine HCl-Donepezil HCl (NAMZARIC) 28-10 MG CP24 Take 1 capsule by mouth at bedtime.     [provider]  pantoprazole (PROTONIX) 40 MG tablet Take 1 tablet (40 mg total) by mouth 2 (two) times daily. 11/13/20 12/13/20  Lynn Ito, MD  pioglitazone (ACTOS) 45 MG tablet Take 45 mg by mouth daily.    [provider]  polyethylene glycol (MIRALAX / GLYCOLAX) packet Take 17 g by mouth daily.    [provider]  pravastatin (PRAVACHOL) 40 MG tablet Take 40 mg by mouth at bedtime.     [provider]  QUEtiapine (SEROQUEL) 400 MG tablet Take 600 mg by mouth at bedtime.    [provider]  sitaGLIPtin (JANUVIA) 100 MG tablet Take 100 mg by mouth daily.    [provider]  SYNJARDY 12.01-999 MG TABS Take 1 tablet by mouth 2 (two) times daily. 06/11/20   [provider]  tiotropium (SPIRIVA) 18 MCG inhalation capsule Place 18 mcg into inhaler and inhale daily.    [provider]  traZODone (DESYREL) 100 MG tablet Take 200 mg by mouth at bedtime.    [provider]  Vitamin D, Ergocalciferol, (DRISDOL) 50000 UNITS CAPS capsule Take 50,000 Units by mouth every 7 (seven) days.    [provider]    Allergies Fish allergy and Shellfish allergy  Family History  Problem Relation Age of Onset  . Diabetes Mother   . Kidney failure Mother   . Diabetes Father   . CAD Brother     Social History Social History   Tobacco Use  . Smoking status: Current Every  Day Smoker    Packs/day: 1.00    Types: Cigarettes  . Smokeless tobacco: Never Used  Substance Use Topics  . Alcohol use: No  . Drug use: No    Review of Systems  Constitutional: No fever/chills Eyes: No visual changes. ENT: No sore throat. Cardiovascular: Positive for chest pain. Respiratory: Positive for shortness of breath. Gastrointestinal: No abdominal pain.  No nausea, no vomiting.  No diarrhea.  No constipation. Genitourinary: Negative for dysuria. Musculoskeletal: Negative for back pain. Skin: Negative for rash. Neurological: Positive for dizziness.  Negative for headaches, focal weakness or numbness.   ____________________________________________   PHYSICAL EXAM:  VITAL SIGNS: ED Triage Vitals  Enc Vitals Group     BP 11/22/20 1908 126/76     Pulse Rate 11/22/20 1908 (!) 130     Resp 11/22/20 1908 18     Temp 11/22/20 1908 99 F (37.2 C)     Temp Source  11/22/20 1908 Oral     SpO2 11/22/20 1908 100 %     Weight 11/22/20 1909 166 lb (75.3 kg)     Height 11/22/20 1909 5\' 9"  (1.753 m)     Head Circumference --      Peak Flow --      Pain Score 11/22/20 1908 10     Pain Loc --      Pain Edu? --      Excl. in GC? --     Constitutional: Alert and oriented.  Cachectic appearing and in no acute distress. Eyes: Conjunctivae are normal. PERRL. EOMI. Head: Atraumatic. Nose: No congestion/rhinnorhea. Mouth/Throat: Mucous membranes are mildly dry.   Neck: No stridor.   Cardiovascular: Tachycardic rate, regular rhythm. Grossly normal heart sounds.  Good peripheral circulation. Respiratory: Normal respiratory effort.  No retractions. Lungs CTAB. Gastrointestinal: Soft and nontender. No distention. No abdominal bruits. No CVA tenderness. Musculoskeletal: No lower extremity tenderness nor edema.  No joint effusions. Neurologic: Mumbling speech and language. No gross focal neurologic deficits are appreciated.  Skin:  Skin is warm, dry and intact. No rash  noted. Psychiatric: Mood and affect are irritated, uncooperative. Speech is mumbling and behavior is uncooperative.  ____________________________________________   LABS (all labs ordered are listed, but only abnormal results are displayed)  Labs Reviewed  CBC  COMPREHENSIVE METABOLIC PANEL  TROPONIN I (HIGH SENSITIVITY)  TROPONIN I (HIGH SENSITIVITY)   ____________________________________________  EKG  *** ____________________________________________  RADIOLOGY I, SUNG,JADE J, personally viewed and evaluated these images (plain radiographs) as part of my medical decision making, as well as reviewing the written report by the radiologist.  ED MD interpretation:  ***  Official radiology report(s): No results found.  ____________________________________________   PROCEDURES  Procedure(s) performed (including Critical Care):  Procedures   ____________________________________________   INITIAL IMPRESSION / ASSESSMENT AND PLAN / ED COURSE  As part of my medical decision making, I reviewed the following data within the electronic MEDICAL RECORD NUMBER Nursing notes reviewed and incorporated, Labs reviewed, Old chart reviewed, Radiograph reviewed and Notes from prior ED visits     65 year old male brought from group home for evaluation of chest pain, shortness of breath, dizziness, decreased LOC since discontinuation of the metoprolol. Differential diagnosis includes, but is not limited to, ACS, aortic dissection, pulmonary embolism, cardiac tamponade, pneumothorax, pneumonia, pericarditis, myocarditis, GI-related causes including esophagitis/gastritis, and musculoskeletal chest wall pain.    Patient is refusing all interventions including lab work, EKG, chest x-ray, CT head, UA.  Gives no concrete reason, just states he does not want lab work, imaging studies or IV.  Try to barter these for ice cream which he is asking for but patient continues to refuse.  I did personally  reviewed patient's chart and see that he had an admission for DKA on 11/11/2020 and I am concerned for infection, metabolic derangement given his clinical presentation.  Group home mentioned that patient did not get some of his psychiatric medicines because he refused.  Will find out what he missed tonight and attempt to give these.  We will also attempt to contact patient's legal guardian for further direction.      ____________________________________________   FINAL CLINICAL IMPRESSION(S) / ED DIAGNOSES  Final diagnoses:  Chest pain, unspecified type  Dizziness     ED Discharge Orders    None      *Please note:  Johnny [name] was evaluated in Emergency Department on 11/23/2020 for the symptoms described in the history of present illness.  He was evaluated in the context of the global COVID-19 pandemic, which necessitated consideration that the patient might be at risk for infection with the SARS-CoV-2 virus that causes COVID-19. Institutional protocols and algorithms that pertain to the evaluation of patients at risk for COVID-19 are in a state of rapid change based on information released by regulatory bodies including the CDC and federal and state organizations. These policies and algorithms were followed during the patient's care in the ED.  Some ED evaluations and interventions may be delayed as a result of limited staffing during and the pandemic.*   Note:  This document was prepared using Dragon voice recognition software and may include unintentional dictation errors.

## 2020-11-22 NOTE — ED Provider Notes (Signed)
[facility] Emergency Department Provider Note   ____________________________________________   Event Date/Time   First MD Initiated Contact with Patient 11/22/20 2313     (approximate)  I have reviewed the triage vital signs and the nursing notes.   HISTORY  Chief Complaint Dizziness  Level V caveat: Limited by uncooperative nature  HPI Johnny [name] is a 65 y.o. male brought to the ED from group home with a chief complaint of hypertension, chest pain, dizziness and shortness of breath.  Patient has a history of schizophrenia, hypertension, diabetes, hyperlipidemia.  Staff member states patient was taken off his metoprolol several days ago due to recurrent hypotension.  Group home had been giving patient a low-sodium diet but forgot and patient got a sausage patty today.  Subsequent check of his blood pressure was 161/124.  Staff member also states patient's heart rate has been higher than usual since discontinuation of his metoprolol.  Group home has also noted that patient has been sleeping more than usual.  Patient does endorse chest pain, shortness of breath and dizziness but cannot or will not give further detail.     Past Medical History:  Diagnosis Date  . Anemia   . Clostridium difficile colitis   . Diabetes mellitus without complication (HCC)   . DM (diabetes mellitus) (HCC)   . GERD (gastroesophageal reflux disease)   . Herpes   . Hyperlipidemia   . Hypertension   . Schizophrenia [facility])     Patient Active Problem List   Diagnosis Date Noted  . GIB (gastrointestinal bleeding) 11/12/2020  . Intractable vomiting   . Hematemesis with nausea   . Abnormal CT scan, esophagus   . Nausea   . Uncontrolled type 2 diabetes mellitus (HCC) 11/11/2020  . Bacteremia 06/17/2020  . Acute renal failure (ARF) (HCC) 07/05/2015  . SINUS TACHYCARDIA 06/10/2008  . HYPOTENSION, ORTHOSTATIC 06/10/2008  . Type 2 diabetes mellitus with hyperlipidemia (HCC)  05/19/2008  . HYPOGLYCEMIA 04/27/2008  . HYPERLIPIDEMIA 01/22/2008  . Schizophrenia (HCC) 01/22/2008  . ANXIETY 01/22/2008  . DEPRESSION 01/22/2008  . Essential hypertension 01/22/2008  . MYOCARDIAL INFARCTION, HX OF 01/22/2008  . BRONCHITIS, CHRONIC 01/22/2008  . GERD 01/22/2008    Past Surgical History:  Procedure Laterality Date  . CHOLECYSTECTOMY    . ESOPHAGOGASTRODUODENOSCOPY (EGD) WITH PROPOFOL N/A 11/12/2020   Procedure: ESOPHAGOGASTRODUODENOSCOPY (EGD) WITH PROPOFOL;  Surgeon: Midge Minium, MD;  Location[facility] ENDOSCOPY;  Service: Endoscopy;  Laterality: N/A;    Prior to Admission medications   Medication Sig Start Date End Date Taking? Authorizing Provider  albuterol (PROVENTIL HFA;VENTOLIN HFA) 108 (90 BASE) MCG/ACT inhaler Inhale 2 puffs into the lungs every 4 (four) hours as needed for wheezing or shortness of breath.    [provider]  ammonium lactate (AMLACTIN) 12 % cream Apply 1 g topically daily.     [provider]  benazepril (LOTENSIN) 20 MG tablet Take 20 mg by mouth daily. 06/11/20   [provider]  benztropine (COGENTIN) 1 MG tablet Take 1 mg by mouth 2 (two) times daily.    [provider]  BREZTRI AEROSPHERE 160-9-4.8 MCG/ACT AERO Inhale 2 puffs into the lungs 2 (two) times daily. 05/14/20   [provider]  buPROPion (WELLBUTRIN XL) 300 MG 24 hr tablet Take 300 mg by mouth daily. 06/11/20   [provider]  Canagliflozin-Metformin HCl (INVOKAMET) 50-1000 MG TABS Take 1 tablet by mouth 2 (two) times daily with a meal.    [provider]  clonazePAM (KLONOPIN) 1 MG tablet Take 1 mg by mouth 3 (three) times daily.    [provider]  colesevelam (WELCHOL) 625 MG tablet Take 1,875 mg by mouth 2 (two) times daily.     [provider]  divalproex (DEPAKOTE ER) 500 MG 24 hr tablet Take 500 mg by mouth 2 (two) times daily.     [provider]  fluPHENAZine (PROLIXIN) 5 MG tablet  Take 5 mg by mouth 2 (two) times daily. Pt also takes daily as needed for psychosis/agitation.    [provider]  insulin glargine (LANTUS) 100 UNIT/ML injection Inject 30 Units into the skin at bedtime.    [provider]  INVEGA TRINZA 819 MG/2.625ML injection Inject 819 mg into the muscle every 30 (thirty) days. 06/01/20   [provider]  LINZESS 145 MCG CAPS capsule Take 145 mcg by mouth daily. 06/11/20   [provider]  Memantine HCl-Donepezil HCl (NAMZARIC) 28-10 MG CP24 Take 1 capsule by mouth at bedtime.     [provider]  pantoprazole (PROTONIX) 40 MG tablet Take 1 tablet (40 mg total) by mouth 2 (two) times daily. 11/13/20 12/13/20  Lynn Ito, MD  pioglitazone (ACTOS) 45 MG tablet Take 45 mg by mouth daily.    [provider]  polyethylene glycol (MIRALAX / GLYCOLAX) packet Take 17 g by mouth daily.    [provider]  pravastatin (PRAVACHOL) 40 MG tablet Take 40 mg by mouth at bedtime.     [provider]  QUEtiapine (SEROQUEL) 400 MG tablet Take 600 mg by mouth at bedtime.    [provider]  sitaGLIPtin (JANUVIA) 100 MG tablet Take 100 mg by mouth daily.    [provider]  SYNJARDY 12.01-999 MG TABS Take 1 tablet by mouth 2 (two) times daily. 06/11/20   [provider]  tiotropium (SPIRIVA) 18 MCG inhalation capsule Place 18 mcg into inhaler and inhale daily.    [provider]  traZODone (DESYREL) 100 MG tablet Take 200 mg by mouth at bedtime.    [provider]  Vitamin D, Ergocalciferol, (DRISDOL) 50000 UNITS CAPS capsule Take 50,000 Units by mouth every 7 (seven) days.    [provider]    Allergies Fish allergy and Shellfish allergy  Family History  Problem Relation Age of Onset  . Diabetes Mother   . Kidney failure Mother   . Diabetes Father   . CAD Brother     Social History Social History   Tobacco Use  . Smoking status: Current Every  Day Smoker    Packs/day: 1.00    Types: Cigarettes  . Smokeless tobacco: Never Used  Substance Use Topics  . Alcohol use: No  . Drug use: No    Review of Systems  Constitutional: No fever/chills Eyes: No visual changes. ENT: No sore throat. Cardiovascular: Positive for chest pain. Respiratory: Positive for shortness of breath. Gastrointestinal: No abdominal pain.  No nausea, no vomiting.  No diarrhea.  No constipation. Genitourinary: Negative for dysuria. Musculoskeletal: Negative for back pain. Skin: Negative for rash. Neurological: Positive for dizziness.  Negative for headaches, focal weakness or numbness.   ____________________________________________   PHYSICAL EXAM:  VITAL SIGNS: ED Triage Vitals  Enc Vitals Group     BP 11/22/20 1908 126/76     Pulse Rate 11/22/20 1908 (!) 130     Resp 11/22/20 1908 18     Temp 11/22/20 1908 99 F (37.2 C)     Temp Source  11/22/20 1908 Oral     SpO2 11/22/20 1908 100 %     Weight 11/22/20 1909 166 lb (75.3 kg)     Height 11/22/20 1909 5\' 9"  (1.753 m)     Head Circumference --      Peak Flow --      Pain Score 11/22/20 1908 10     Pain Loc --      Pain Edu? --      Excl. in GC? --     Constitutional: Alert and oriented.  Cachectic appearing and in no acute distress. Eyes: Conjunctivae are normal. PERRL. EOMI. Head: Atraumatic. Nose: No congestion/rhinnorhea. Mouth/Throat: Mucous membranes are mildly dry.   Neck: No stridor.   Cardiovascular: Tachycardic rate, regular rhythm. Grossly normal heart sounds.  Good peripheral circulation. Respiratory: Normal respiratory effort.  No retractions. Lungs CTAB. Gastrointestinal: Soft and nontender. No distention. No abdominal bruits. No CVA tenderness. Musculoskeletal: No lower extremity tenderness nor edema.  No joint effusions. Neurologic: Mumbling speech and language. No gross focal neurologic deficits are appreciated.  Skin:  Skin is warm, dry and intact. No rash  noted. Psychiatric: Mood and affect are irritated, uncooperative. Speech is mumbling and behavior is uncooperative.  ____________________________________________   LABS (all labs ordered are listed, but only abnormal results are displayed)  Labs Reviewed - No data to display ____________________________________________  EKG  Refused ____________________________________________  RADIOLOGY I, Marlee Trentman J, personally viewed and evaluated these images (plain radiographs) as part of my medical decision making, as well as reviewing the written report by the radiologist.  ED MD interpretation: Refused  Official radiology report(s): No results found.  ____________________________________________   PROCEDURES  Procedure(s) performed (including Critical Care):  Procedures   ____________________________________________   INITIAL IMPRESSION / ASSESSMENT AND PLAN / ED COURSE  As part of my medical decision making, I reviewed the following data within the electronic MEDICAL RECORD NUMBER Nursing notes reviewed and incorporated, Labs reviewed, Old chart reviewed, Radiograph reviewed and Notes from prior ED visits     65 year old male brought from group home for evaluation of chest pain, shortness of breath, dizziness, decreased LOC since discontinuation of the metoprolol. Differential diagnosis includes, but is not limited to, ACS, aortic dissection, pulmonary embolism, cardiac tamponade, pneumothorax, pneumonia, pericarditis, myocarditis, GI-related causes including esophagitis/gastritis, and musculoskeletal chest wall pain.    Patient is refusing all interventions including lab work, EKG, chest x-ray, CT head, UA.  Gives no concrete reason, just states he does not want lab work, imaging studies or IV.  Try to barter these for ice cream which he is asking for but patient continues to refuse.  I did personally reviewed patient's chart and see that he had an admission for DKA on 11/11/2020 and I  am concerned for infection, metabolic derangement given his clinical presentation.  Group home mentioned that patient did not get some of his psychiatric medicines because he refused.  Will find out what he missed tonight and attempt to give these.  We will also attempt to contact patient's legal guardian for further direction.  Clinical Course as of 11/23/20 0325  Tue Nov 23, 2020  0036 No answer from legal guardian despite multiple attempts by nursing.  Asked input from nursing supervisor [id number] who recommends repeating blood pressure and discharge back to group home if BP stable.  Patient refused oral Klonopin which he takes regularly.  However, at this time he is allowing nursing staff to keep BP cuff on and bartering for ice cream.  No sugar-free  ice cream available but will let patient have a little ice cream in the hopes that he will allow blood work/IV.  Caregiver states patient's blood sugar is checked daily and last check was in the 100s. [JS]  0142 Still no answer from legal guardian despite nursing attempts to call.  Patient had a conversation with the supervisor from the group home; he was lucid with clear speech during that conversation.  They wish for him to be discharged and will follow up with his doctor this week.  Heart rate down to 114.  Blood pressure stable.  Strict return precautions given.  Caregiver verbalizes understanding agrees with plan of care. [JS]    Clinical Course User Index [JS] Irean Hong, MD     ____________________________________________   FINAL CLINICAL IMPRESSION(S) / ED DIAGNOSES  Final diagnoses:  Chest pain, unspecified type  Dizziness     ED Discharge Orders    None      *Please note:  ANKIT DEGREGORIO was evaluated in Emergency Department on 11/23/2020 for the symptoms described in the history of present illness. He was evaluated in the context of the global COVID-19 pandemic, which necessitated consideration that the patient might be at risk  for infection with the SARS-CoV-2 virus that causes COVID-19. Institutional protocols and algorithms that pertain to the evaluation of patients at risk for COVID-19 are in a state of rapid change based on information released by regulatory bodies including the CDC and federal and state organizations. These policies and algorithms were followed during the patient's care in the ED.  Some ED evaluations and interventions may be delayed as a result of limited staffing during and the pandemic.*   Note:  This document was prepared using Dragon voice recognition software and may include unintentional dictation errors.   Irean Hong, MD 11/23/20 [phone]

## 2020-11-22 NOTE — ED Triage Notes (Signed)
Pt was taken off bp meds last week due to hypotension and today bp 161/124 and HR 101. Group staff states he has had some chest pain and shob. Pt lives at [facility], care staff with patient.

## 2020-11-22 NOTE — ED Notes (Signed)
Pt refusing protocols at this time, group home staff is aware.

## 2020-11-23 ENCOUNTER — Telehealth: Payer: Self-pay | Admitting: Medical Oncology

## 2020-11-23 MED ORDER — CLONAZEPAM 0.5 MG PO TABS
1.0000 mg | ORAL_TABLET | Freq: Once | ORAL | Status: DC
  Filled 2020-11-23: qty 2

## 2020-11-23 NOTE — Discharge Instructions (Addendum)
Please return to the ER if you change your mind about having blood work done.  Please take the medicines you are prescribed and see your doctor in the next 1 to 2 days.  Return to the ER for worsening symptoms, persistent vomiting, difficulty breathing or other concerns

## 2020-11-23 NOTE — Telephone Encounter (Signed)
Pts legal guardian, Bevelyn Ngo, called charge RN d/t VM left over night. This RN explained that we called d/t hospital policy of the need for legal guardian contact upon discharge. Celine Mans stated that she would f/u with pt to make sure that a PCP appointment was made.

## 2020-11-23 NOTE — ED Notes (Signed)
Messages left for legal guardian due to no answer with attempt.  Pts group home member that has been present with pt reports to supervisor of group home, John and this RN reports new vital signs and concern of pt not complying as well as not being able to reach legal guardian. Per Jonny Ruiz, pt to return to facility and follow up with PCP as well as legal guardian in the AM. Per Jonny Ruiz, if any continued issues at the facility, pt will be brought back to ED.   MD made aware.

## 2020-11-25 ENCOUNTER — Emergency Department: Payer: Medicare Other

## 2020-11-25 ENCOUNTER — Inpatient Hospital Stay
Admission: EM | Admit: 2020-11-25 | Discharge: 2020-11-28 | DRG: 392 | Disposition: A | Discharge status: Home / Self Care | Payer: Medicare Other | Source: Ambulatory Visit | Attending: Internal Medicine | Admitting: Internal Medicine

## 2020-11-25 ENCOUNTER — Other Ambulatory Visit: Payer: Self-pay

## 2020-11-25 DIAGNOSIS — Z7984 Long term (current) use of oral hypoglycemic drugs: Secondary | ICD-10-CM

## 2020-11-25 DIAGNOSIS — Z79899 Other long term (current) drug therapy: Secondary | ICD-10-CM

## 2020-11-25 DIAGNOSIS — K209 Esophagitis, unspecified without bleeding: Secondary | ICD-10-CM | POA: Diagnosis present

## 2020-11-25 DIAGNOSIS — Z20822 Contact with and (suspected) exposure to covid-19: Secondary | ICD-10-CM | POA: Diagnosis present

## 2020-11-25 DIAGNOSIS — E861 Hypovolemia: Secondary | ICD-10-CM

## 2020-11-25 DIAGNOSIS — I9589 Other hypotension: Secondary | ICD-10-CM | POA: Diagnosis present

## 2020-11-25 DIAGNOSIS — Z841 Family history of disorders of kidney and ureter: Secondary | ICD-10-CM

## 2020-11-25 DIAGNOSIS — R112 Nausea with vomiting, unspecified: Secondary | ICD-10-CM

## 2020-11-25 DIAGNOSIS — R1110 Vomiting, unspecified: Secondary | ICD-10-CM | POA: Diagnosis present

## 2020-11-25 DIAGNOSIS — I10 Essential (primary) hypertension: Secondary | ICD-10-CM | POA: Diagnosis present

## 2020-11-25 DIAGNOSIS — Z91013 Allergy to seafood: Secondary | ICD-10-CM

## 2020-11-25 DIAGNOSIS — F32A Depression, unspecified: Secondary | ICD-10-CM | POA: Diagnosis present

## 2020-11-25 DIAGNOSIS — R140 Abdominal distension (gaseous): Secondary | ICD-10-CM

## 2020-11-25 DIAGNOSIS — K5901 Slow transit constipation: Principal | ICD-10-CM

## 2020-11-25 DIAGNOSIS — Z87891 Personal history of nicotine dependence: Secondary | ICD-10-CM

## 2020-11-25 DIAGNOSIS — J45909 Unspecified asthma, uncomplicated: Secondary | ICD-10-CM | POA: Diagnosis present

## 2020-11-25 DIAGNOSIS — Z9049 Acquired absence of other specified parts of digestive tract: Secondary | ICD-10-CM

## 2020-11-25 DIAGNOSIS — K2100 Gastro-esophageal reflux disease with esophagitis, without bleeding: Secondary | ICD-10-CM | POA: Diagnosis not present

## 2020-11-25 DIAGNOSIS — F419 Anxiety disorder, unspecified: Secondary | ICD-10-CM | POA: Diagnosis present

## 2020-11-25 DIAGNOSIS — E860 Dehydration: Secondary | ICD-10-CM | POA: Diagnosis not present

## 2020-11-25 DIAGNOSIS — IMO0002 Reserved for concepts with insufficient information to code with codable children: Secondary | ICD-10-CM | POA: Diagnosis present

## 2020-11-25 DIAGNOSIS — K219 Gastro-esophageal reflux disease without esophagitis: Secondary | ICD-10-CM | POA: Diagnosis present

## 2020-11-25 DIAGNOSIS — Z8249 Family history of ischemic heart disease and other diseases of the circulatory system: Secondary | ICD-10-CM

## 2020-11-25 DIAGNOSIS — Z833 Family history of diabetes mellitus: Secondary | ICD-10-CM

## 2020-11-25 DIAGNOSIS — F0390 Unspecified dementia without behavioral disturbance: Secondary | ICD-10-CM | POA: Diagnosis present

## 2020-11-25 DIAGNOSIS — E440 Moderate protein-calorie malnutrition: Secondary | ICD-10-CM | POA: Insufficient documentation

## 2020-11-25 DIAGNOSIS — Z794 Long term (current) use of insulin: Secondary | ICD-10-CM

## 2020-11-25 DIAGNOSIS — K208 Other esophagitis without bleeding: Secondary | ICD-10-CM

## 2020-11-25 DIAGNOSIS — E1165 Type 2 diabetes mellitus with hyperglycemia: Secondary | ICD-10-CM

## 2020-11-25 DIAGNOSIS — E1169 Type 2 diabetes mellitus with other specified complication: Secondary | ICD-10-CM | POA: Diagnosis present

## 2020-11-25 DIAGNOSIS — F209 Schizophrenia, unspecified: Secondary | ICD-10-CM | POA: Diagnosis present

## 2020-11-25 DIAGNOSIS — Z6822 Body mass index (BMI) 22.0-22.9, adult: Secondary | ICD-10-CM

## 2020-11-25 DIAGNOSIS — E785 Hyperlipidemia, unspecified: Secondary | ICD-10-CM | POA: Diagnosis present

## 2020-11-25 LAB — LIPASE, BLOOD: Lipase: 33 U/L (ref 11–51)

## 2020-11-25 LAB — PHOSPHORUS: Phosphorus: 3.3 mg/dL (ref 2.5–4.6)

## 2020-11-25 LAB — CBC WITH DIFFERENTIAL/PLATELET
Abs Immature Granulocytes: 0.1 10*3/uL — ABNORMAL HIGH (ref 0.00–0.07)
Basophils Absolute: 0.1 10*3/uL (ref 0.0–0.1)
Basophils Relative: 1 %
Eosinophils Absolute: 0 10*3/uL (ref 0.0–0.5)
Eosinophils Relative: 0 %
HCT: 42.4 % (ref 39.0–52.0)
Hemoglobin: 14.4 g/dL (ref 13.0–17.0)
Immature Granulocytes: 1 %
Lymphocytes Relative: 19 %
Lymphs Abs: 2.1 10*3/uL (ref 0.7–4.0)
MCH: 29.8 pg (ref 26.0–34.0)
MCHC: 34 g/dL (ref 30.0–36.0)
MCV: 87.6 fL (ref 80.0–100.0)
Monocytes Absolute: 1.2 10*3/uL — ABNORMAL HIGH (ref 0.1–1.0)
Monocytes Relative: 11 %
Neutro Abs: 7.5 10*3/uL (ref 1.7–7.7)
Neutrophils Relative %: 68 %
Platelets: 284 10*3/uL (ref 150–400)
RBC: 4.84 MIL/uL (ref 4.22–5.81)
RDW: 15 % (ref 11.5–15.5)
WBC: 11 10*3/uL — ABNORMAL HIGH (ref 4.0–10.5)
nRBC: 0 % (ref 0.0–0.2)

## 2020-11-25 LAB — TROPONIN I (HIGH SENSITIVITY)
Troponin I (High Sensitivity): 4 ng/L (ref <18)
Troponin I (High Sensitivity): 6 ng/L (ref <18)

## 2020-11-25 LAB — RESP PANEL BY RT-PCR (FLU A&B, COVID) ARPGX2
Influenza A by PCR: NEGATIVE
Influenza B by PCR: NEGATIVE
SARS Coronavirus 2 by RT PCR: NEGATIVE

## 2020-11-25 LAB — MAGNESIUM: Magnesium: 1.9 mg/dL (ref 1.7–2.4)

## 2020-11-25 LAB — COMPREHENSIVE METABOLIC PANEL
ALT: 10 U/L (ref 0–44)
AST: 11 U/L — ABNORMAL LOW (ref 15–41)
Albumin: 3.4 g/dL — ABNORMAL LOW (ref 3.5–5.0)
Alkaline Phosphatase: 71 U/L (ref 38–126)
Anion gap: 11 (ref 5–15)
BUN: 31 mg/dL — ABNORMAL HIGH (ref 8–23)
CO2: 27 mmol/L (ref 22–32)
Calcium: 9.4 mg/dL (ref 8.9–10.3)
Chloride: 95 mmol/L — ABNORMAL LOW (ref 98–111)
Creatinine, Ser: 1.17 mg/dL (ref 0.61–1.24)
GFR, Estimated: >60 mL/min (ref >60)
Glucose, Bld: 229 mg/dL — ABNORMAL HIGH (ref 70–99)
Potassium: 4.5 mmol/L (ref 3.5–5.1)
Sodium: 133 mmol/L — ABNORMAL LOW (ref 135–145)
Total Bilirubin: 0.9 mg/dL (ref 0.3–1.2)
Total Protein: 6.6 g/dL (ref 6.5–8.1)

## 2020-11-25 LAB — HEMOGLOBIN A1C
Hgb A1c MFr Bld: 7.8 % — ABNORMAL HIGH (ref 4.8–5.6)
Mean Plasma Glucose: 177.16 mg/dL

## 2020-11-25 LAB — LACTIC ACID, PLASMA
Lactic Acid, Venous: 1.5 mmol/L (ref 0.5–1.9)
Lactic Acid, Venous: 1.2 mmol/L (ref 0.5–1.9)

## 2020-11-25 LAB — GLUCOSE, CAPILLARY
Glucose-Capillary: 211 mg/dL — ABNORMAL HIGH (ref 70–99)
Glucose-Capillary: 266 mg/dL — ABNORMAL HIGH (ref 70–99)

## 2020-11-25 LAB — CK: Total CK: 35 U/L — ABNORMAL LOW (ref 49–397)

## 2020-11-25 LAB — VALPROIC ACID LEVEL: Valproic Acid Lvl: 45 ug/mL — ABNORMAL LOW (ref 50.0–100.0)

## 2020-11-25 MED ORDER — SODIUM CHLORIDE 0.9 % IV SOLN: INTRAVENOUS | Status: DC

## 2020-11-25 MED ORDER — CLONAZEPAM 1 MG PO TABS
1.0000 mg | ORAL_TABLET | Freq: Three times a day (TID) | ORAL | Status: DC
  Administered 2020-11-25 – 2020-11-28 (×9): 1 mg via ORAL
  Filled 2020-11-25 (×9): qty 1

## 2020-11-25 MED ORDER — PRAVASTATIN SODIUM 40 MG PO TABS
40.0000 mg | ORAL_TABLET | Freq: Every day | ORAL | Status: DC
  Administered 2020-11-25 – 2020-11-27 (×3): 40 mg via ORAL
  Filled 2020-11-25 (×3): qty 1

## 2020-11-25 MED ORDER — LACTATED RINGERS IV BOLUS: 1000.0000 mL | Freq: Once | INTRAVENOUS | Status: DC

## 2020-11-25 MED ORDER — LIDOCAINE VISCOUS HCL 2 % MT SOLN
15.0000 mL | Freq: Once | OROMUCOSAL | Status: AC
  Administered 2020-11-25: 15 mL via ORAL
  Filled 2020-11-25: qty 15

## 2020-11-25 MED ORDER — THIAMINE HCL 100 MG/ML IJ SOLN
100.0000 mg | Freq: Once | INTRAMUSCULAR | Status: DC
  Filled 2020-11-25: qty 2

## 2020-11-25 MED ORDER — DONEPEZIL HCL 5 MG PO TABS
10.0000 mg | ORAL_TABLET | Freq: Every day | ORAL | Status: DC
  Administered 2020-11-26 – 2020-11-27 (×3): 10 mg via ORAL
  Filled 2020-11-25 (×4): qty 2

## 2020-11-25 MED ORDER — DIPHENHYDRAMINE HCL 50 MG/ML IJ SOLN
25.0000 mg | Freq: Once | INTRAMUSCULAR | Status: AC
  Administered 2020-11-25: 25 mg via INTRAVENOUS
  Filled 2020-11-25: qty 1

## 2020-11-25 MED ORDER — ACETAMINOPHEN 325 MG PO TABS: 650.0000 mg | ORAL_TABLET | Freq: Four times a day (QID) | ORAL | Status: DC | PRN

## 2020-11-25 MED ORDER — DIVALPROEX SODIUM ER 500 MG PO TB24
500.0000 mg | ORAL_TABLET | Freq: Two times a day (BID) | ORAL | Status: DC
  Administered 2020-11-25 – 2020-11-28 (×6): 500 mg via ORAL
  Filled 2020-11-25 (×7): qty 1

## 2020-11-25 MED ORDER — FAMOTIDINE IN NACL 20-0.9 MG/50ML-% IV SOLN
20.0000 mg | Freq: Once | INTRAVENOUS | Status: AC
  Administered 2020-11-25: 20 mg via INTRAVENOUS
  Filled 2020-11-25: qty 50

## 2020-11-25 MED ORDER — MOMETASONE FURO-FORMOTEROL FUM 200-5 MCG/ACT IN AERO
2.0000 | INHALATION_SPRAY | Freq: Two times a day (BID) | RESPIRATORY_TRACT | Status: DC
  Administered 2020-11-26 – 2020-11-28 (×5): 2 via RESPIRATORY_TRACT
  Filled 2020-11-25: qty 8.8

## 2020-11-25 MED ORDER — TIOTROPIUM BROMIDE MONOHYDRATE 18 MCG IN CAPS
18.0000 ug | ORAL_CAPSULE | Freq: Every day | RESPIRATORY_TRACT | Status: DC
  Administered 2020-11-27 – 2020-11-28 (×2): 18 ug via RESPIRATORY_TRACT
  Filled 2020-11-25: qty 5

## 2020-11-25 MED ORDER — BENZTROPINE MESYLATE 1 MG PO TABS
1.0000 mg | ORAL_TABLET | Freq: Two times a day (BID) | ORAL | Status: DC
  Administered 2020-11-25 – 2020-11-28 (×6): 1 mg via ORAL
  Filled 2020-11-25 (×7): qty 1

## 2020-11-25 MED ORDER — INSULIN GLARGINE 100 UNIT/ML ~~LOC~~ SOLN
20.0000 [IU] | Freq: Every day | SUBCUTANEOUS | Status: DC
  Administered 2020-11-26 (×2): 20 [IU] via SUBCUTANEOUS
  Filled 2020-11-25 (×3): qty 0.2

## 2020-11-25 MED ORDER — ACETAMINOPHEN 650 MG RE SUPP: 650.0000 mg | Freq: Four times a day (QID) | RECTAL | Status: DC | PRN

## 2020-11-25 MED ORDER — LACTATED RINGERS IV BOLUS
1000.0000 mL | Freq: Once | INTRAVENOUS | Status: AC
  Administered 2020-11-25: 1000 mL via INTRAVENOUS

## 2020-11-25 MED ORDER — TRAZODONE HCL 100 MG PO TABS
200.0000 mg | ORAL_TABLET | Freq: Every day | ORAL | Status: DC
  Administered 2020-11-25 – 2020-11-27 (×3): 200 mg via ORAL
  Filled 2020-11-25 (×3): qty 2

## 2020-11-25 MED ORDER — BUPROPION HCL ER (XL) 150 MG PO TB24
300.0000 mg | ORAL_TABLET | Freq: Every day | ORAL | Status: DC
  Administered 2020-11-26 – 2020-11-28 (×3): 300 mg via ORAL
  Filled 2020-11-25 (×3): qty 2

## 2020-11-25 MED ORDER — MEMANTINE HCL-DONEPEZIL HCL ER 28-10 MG PO CP24: 1.0000 | ORAL_CAPSULE | Freq: Every day | ORAL | Status: DC

## 2020-11-25 MED ORDER — MEMANTINE HCL ER 28 MG PO CP24
28.0000 mg | ORAL_CAPSULE | Freq: Every day | ORAL | Status: DC
  Administered 2020-11-26 – 2020-11-27 (×3): 28 mg via ORAL
  Filled 2020-11-25 (×4): qty 1

## 2020-11-25 MED ORDER — METOCLOPRAMIDE HCL 5 MG/ML IJ SOLN
10.0000 mg | Freq: Once | INTRAMUSCULAR | Status: AC
  Administered 2020-11-25: 10 mg via INTRAVENOUS
  Filled 2020-11-25: qty 2

## 2020-11-25 MED ORDER — BUDESON-GLYCOPYRROL-FORMOTEROL 160-9-4.8 MCG/ACT IN AERO: 2.0000 | INHALATION_SPRAY | Freq: Two times a day (BID) | RESPIRATORY_TRACT | Status: DC

## 2020-11-25 MED ORDER — ALBUTEROL SULFATE HFA 108 (90 BASE) MCG/ACT IN AERS
2.0000 | INHALATION_SPRAY | RESPIRATORY_TRACT | Status: DC | PRN
  Filled 2020-11-25: qty 6.7

## 2020-11-25 MED ORDER — INSULIN ASPART 100 UNIT/ML ~~LOC~~ SOLN
0.0000 [IU] | SUBCUTANEOUS | Status: DC
  Administered 2020-11-25: 3 [IU] via SUBCUTANEOUS
  Administered 2020-11-26: 5 [IU] via SUBCUTANEOUS
  Administered 2020-11-26: 7 [IU] via SUBCUTANEOUS
  Administered 2020-11-26: 9 [IU] via SUBCUTANEOUS
  Administered 2020-11-26 (×2): 2 [IU] via SUBCUTANEOUS
  Administered 2020-11-27 (×2): 3 [IU] via SUBCUTANEOUS
  Filled 2020-11-25 (×8): qty 1

## 2020-11-25 MED ORDER — SODIUM CHLORIDE 0.9 % IV SOLN
80.0000 mg | Freq: Once | INTRAVENOUS | Status: AC
  Administered 2020-11-25: 80 mg via INTRAVENOUS
  Filled 2020-11-25 (×2): qty 80

## 2020-11-25 MED ORDER — QUETIAPINE FUMARATE 300 MG PO TABS
600.0000 mg | ORAL_TABLET | Freq: Every day | ORAL | Status: DC
  Administered 2020-11-25 – 2020-11-27 (×3): 600 mg via ORAL
  Filled 2020-11-25 (×4): qty 2

## 2020-11-25 MED ORDER — COLESEVELAM HCL 625 MG PO TABS
1875.0000 mg | ORAL_TABLET | Freq: Two times a day (BID) | ORAL | Status: DC
  Administered 2020-11-26 – 2020-11-28 (×6): 1875 mg via ORAL
  Filled 2020-11-25 (×7): qty 3

## 2020-11-25 MED ORDER — ONDANSETRON HCL 4 MG/2ML IJ SOLN
4.0000 mg | Freq: Four times a day (QID) | INTRAMUSCULAR | Status: DC | PRN
  Administered 2020-11-25: 4 mg via INTRAVENOUS
  Filled 2020-11-25: qty 2

## 2020-11-25 MED ORDER — HYDROCODONE-ACETAMINOPHEN 5-325 MG PO TABS: 1.0000 | ORAL_TABLET | ORAL | Status: DC | PRN

## 2020-11-25 MED ORDER — METOCLOPRAMIDE HCL 5 MG/ML IJ SOLN: 5.0000 mg | Freq: Four times a day (QID) | INTRAMUSCULAR | Status: DC | PRN

## 2020-11-25 MED ORDER — ALUM & MAG HYDROXIDE-SIMETH 200-200-20 MG/5ML PO SUSP
30.0000 mL | Freq: Once | ORAL | Status: AC
  Administered 2020-11-25: 30 mL via ORAL
  Filled 2020-11-25: qty 30

## 2020-11-25 MED ORDER — PANTOPRAZOLE SODIUM 40 MG IV SOLR
40.0000 mg | Freq: Two times a day (BID) | INTRAVENOUS | Status: DC
  Administered 2020-11-25 – 2020-11-28 (×6): 40 mg via INTRAVENOUS
  Filled 2020-11-25 (×6): qty 40

## 2020-11-25 NOTE — ED Provider Notes (Signed)
[facility] Emergency Department Provider Note  ____________________________________________   Event Date/Time   First MD Initiated Contact with Patient 11/25/20 1636     (approximate)   I have reviewed the triage vital signs and the nursing notes.   HISTORY  Chief Complaint Dehydration    HPI Johnny [name] is a 65 y.o. male  HTN, HLD, schizophrenia, here with nausea, vomiting.  The patient has recently been seen 3 times for recurrent nausea and vomiting.  He is admitted in early March with DKA and severe esophagitis.  He was seen again 2 days ago with similar complaints and was treated symptomatically.  For the last 2 days, since that visit, the patient has been taking antacids and antiemetics and has been unable to keep anything down.  He said associated severe nausea, vomiting.  Every time he tries to eat, it immediately comes back up.  He has associated burning pain with this.  He states that he feels hungry, but is unable to keep anything down.  Denies any blood in his emesis.  Denies any blood in his stools.  No fevers or chills.  Notes medication changes.  The pain is mostly a burning sensation as well as crampy abdominal pain after vomiting that does not persist.  No alleviating factors.        Past Medical History:  Diagnosis Date  . Anemia   . Clostridium difficile colitis   . Diabetes mellitus without complication (HCC)   . DM (diabetes mellitus) (HCC)   . GERD (gastroesophageal reflux disease)   . Herpes   . Hyperlipidemia   . Hypertension   . Schizophrenia [facility])     Patient Active Problem List   Diagnosis Date Noted  . GIB (gastrointestinal bleeding) 11/12/2020  . Intractable vomiting   . Hematemesis with nausea   . Abnormal CT scan, esophagus   . Nausea   . Uncontrolled type 2 diabetes mellitus (HCC) 11/11/2020  . Bacteremia 06/17/2020  . Acute renal failure (ARF) (HCC) 07/05/2015  . SINUS TACHYCARDIA 06/10/2008  .  HYPOTENSION, ORTHOSTATIC 06/10/2008  . Type 2 diabetes mellitus with hyperlipidemia (HCC) 05/19/2008  . HYPOGLYCEMIA 04/27/2008  . HYPERLIPIDEMIA 01/22/2008  . Schizophrenia (HCC) 01/22/2008  . ANXIETY 01/22/2008  . DEPRESSION 01/22/2008  . Essential hypertension 01/22/2008  . MYOCARDIAL INFARCTION, HX OF 01/22/2008  . BRONCHITIS, CHRONIC 01/22/2008  . GERD 01/22/2008    Past Surgical History:  Procedure Laterality Date  . CHOLECYSTECTOMY    . ESOPHAGOGASTRODUODENOSCOPY (EGD) WITH PROPOFOL N/A 11/12/2020   Procedure: ESOPHAGOGASTRODUODENOSCOPY (EGD) WITH PROPOFOL;  Surgeon: Midge Minium, MD;  Location: [facility] ENDOSCOPY;  Service: Endoscopy;  Laterality: N/A;    Prior to Admission medications   Medication Sig Start Date End Date Taking? Authorizing Provider  albuterol (PROVENTIL HFA;VENTOLIN HFA) 108 (90 BASE) MCG/ACT inhaler Inhale 2 puffs into the lungs every 4 (four) hours as needed for wheezing or shortness of breath.    [provider]  ammonium lactate (AMLACTIN) 12 % cream Apply 1 g topically daily.     [provider]  benazepril (LOTENSIN) 20 MG tablet Take 20 mg by mouth daily. 06/11/20   [provider]  benztropine (COGENTIN) 1 MG tablet Take 1 mg by mouth 2 (two) times daily.    [provider]  BREZTRI AEROSPHERE 160-9-4.8 MCG/ACT AERO Inhale 2 puffs into the lungs 2 (two) times daily. 05/14/20   [provider]  buPROPion (WELLBUTRIN XL) 300 MG 24 hr tablet Take 300 mg by  mouth daily. 06/11/20   [provider]  Canagliflozin-Metformin HCl (INVOKAMET) 50-1000 MG TABS Take 1 tablet by mouth 2 (two) times daily with a meal.    [provider]  clonazePAM (KLONOPIN) 1 MG tablet Take 1 mg by mouth 3 (three) times daily.    [provider]  colesevelam (WELCHOL) 625 MG tablet Take 1,875 mg by mouth 2 (two) times daily.     [provider]  divalproex (DEPAKOTE ER) 500 MG 24 hr tablet Take 500 mg by mouth  2 (two) times daily.     [provider]  fluPHENAZine (PROLIXIN) 5 MG tablet Take 5 mg by mouth 2 (two) times daily. Pt also takes daily as needed for psychosis/agitation.    [provider]  insulin glargine (LANTUS) 100 UNIT/ML injection Inject 30 Units into the skin at bedtime.    [provider]  INVEGA TRINZA 819 MG/2.625ML injection Inject 819 mg into the muscle every 30 (thirty) days. 06/01/20   [provider]  LINZESS 145 MCG CAPS capsule Take 145 mcg by mouth daily. 06/11/20   [provider]  Memantine HCl-Donepezil HCl (NAMZARIC) 28-10 MG CP24 Take 1 capsule by mouth at bedtime.     [provider]  pantoprazole (PROTONIX) 40 MG tablet Take 1 tablet (40 mg total) by mouth 2 (two) times daily. 11/13/20 12/13/20  Lynn ItoAmery, Sahar, MD  pioglitazone (ACTOS) 45 MG tablet Take 45 mg by mouth daily.    [provider]  polyethylene glycol (MIRALAX / GLYCOLAX) packet Take 17 g by mouth daily.    [provider]  pravastatin (PRAVACHOL) 40 MG tablet Take 40 mg by mouth at bedtime.     [provider]  QUEtiapine (SEROQUEL) 400 MG tablet Take 600 mg by mouth at bedtime.    [provider]  sitaGLIPtin (JANUVIA) 100 MG tablet Take 100 mg by mouth daily.    [provider]  SYNJARDY 12.01-999 MG TABS Take 1 tablet by mouth 2 (two) times daily. 06/11/20   [provider]  tiotropium (SPIRIVA) 18 MCG inhalation capsule Place 18 mcg into inhaler and inhale daily.    [provider]  traZODone (DESYREL) 100 MG tablet Take 200 mg by mouth at bedtime.    [provider]  Vitamin D, Ergocalciferol, (DRISDOL) 50000 UNITS CAPS capsule Take 50,000 Units by mouth every 7 (seven) days.    [provider]    Allergies Fish allergy and Shellfish allergy  Family History  Problem Relation Age of Onset  . Diabetes Mother   . Kidney failure Mother   . Diabetes Father   . CAD Brother      Social History Social History   Tobacco Use  . Smoking status: Current Every Day Smoker    Packs/day: 1.00    Types: Cigarettes  . Smokeless tobacco: Never Used  Substance Use Topics  . Alcohol use: No  . Drug use: No    Review of Systems  Review of Systems  Constitutional: Positive for fatigue. Negative for chills and fever.  HENT: Negative for sore throat.   Respiratory: Negative for shortness of breath.   Cardiovascular: Negative for chest pain.  Gastrointestinal: Positive for abdominal pain, nausea and vomiting.  Genitourinary: Negative for flank pain.  Musculoskeletal: Negative for neck pain.  Skin: Negative for rash and wound.  Allergic/Immunologic: Negative for immunocompromised state.  Neurological: Negative for weakness and numbness.  Hematological: Does not bruise/bleed easily.  All other systems reviewed and are  negative.    ____________________________________________  PHYSICAL EXAM:      VITAL SIGNS: ED Triage Vitals  Enc Vitals Group     BP 11/25/20 1606 91/74     Pulse Rate 11/25/20 1606 (!) 125     Resp 11/25/20 1606 18     Temp 11/25/20 1610 99.1 F (37.3 C)     Temp Source 11/25/20 1610 Oral     SpO2 11/25/20 1606 99 %     Weight --      Height --      Head Circumference --      Peak Flow --      Pain Score 11/25/20 1611 4     Pain Loc --      Pain Edu? --      Excl. in GC? --      Physical Exam Vitals and nursing note reviewed.  Constitutional:      General: He is not in acute distress.    Appearance: He is well-developed.  HENT:     Head: Normocephalic and atraumatic.     Mouth/Throat:     Mouth: Mucous membranes are dry.  Eyes:     Conjunctiva/sclera: Conjunctivae normal.  Cardiovascular:     Rate and Rhythm: Normal rate and regular rhythm.     Heart sounds: Normal heart sounds. No murmur heard. No friction rub.  Pulmonary:     Effort: Pulmonary effort is normal. No respiratory distress.     Breath sounds: Normal breath  sounds. No wheezing or rales.  Abdominal:     General: Abdomen is flat. There is no distension.     Palpations: Abdomen is soft.     Tenderness: There is abdominal tenderness (Mild, epigastric, no rebound or guarding).  Musculoskeletal:     Cervical back: Neck supple.  Skin:    General: Skin is warm.     Capillary Refill: Capillary refill takes less than 2 seconds.  Neurological:     Mental Status: He is alert and oriented to person, place, and time.     Motor: No abnormal muscle tone.       ____________________________________________   LABS (all labs ordered are listed, but only abnormal results are displayed)  Labs Reviewed  COMPREHENSIVE METABOLIC PANEL - Abnormal; Notable for the following components:      Result Value   Sodium 133 (*)    Chloride 95 (*)    Glucose, Bld 229 (*)    BUN 31 (*)    Albumin 3.4 (*)    AST 11 (*)    All other components within normal limits  CBC WITH DIFFERENTIAL/PLATELET - Abnormal; Notable for the following components:   WBC 11.0 (*)    Monocytes Absolute 1.2 (*)    Abs Immature Granulocytes 0.10 (*)    All other components within normal limits  RESP PANEL BY RT-PCR (FLU A&B, COVID) ARPGX2  LIPASE, BLOOD  LACTIC ACID, PLASMA  MAGNESIUM  LACTIC ACID, PLASMA  TROPONIN I (HIGH SENSITIVITY)  TROPONIN I (HIGH SENSITIVITY)    ____________________________________________  EKG: Sinus tachycardia, ventricular rate 126.  PR 132, QRS 80, QTc 492.  No acute ST elevations or Vesprin acute evidence of acute ischemia or infarct. ________________________________________  RADIOLOGY All imaging, including plain films, CT scans, and ultrasounds, independently reviewed by me, and interpretations confirmed via formal radiology reads.  ED MD interpretation:   Chest x-ray: Mild to moderate right lower lobe scarring and/or atelectasis  Official radiology report(s): DG Chest 2 View  Result  Date: 11/25/2020 CLINICAL DATA:  Vomiting. EXAM: CHEST - 2  VIEW COMPARISON:  November 10, 2020 FINDINGS: The lungs are mildly hyperinflated. Stable linear scarring is seen within the mid right lung and lateral aspect of the right lung base. Mild to moderate severity atelectasis and/or infiltrate is noted within the posterior aspect of the right lower lobe. A small right pleural effusion is seen. No pneumothorax is identified. The heart size and mediastinal contours are within normal limits. There is moderate severity calcification of the aortic arch. Radiopaque surgical clips are seen within the right upper quadrant. The visualized skeletal structures are unremarkable. IMPRESSION: 1. Mild to moderate severity right lower lobe scarring and/or atelectasis with small right pleural effusion. Electronically Signed   By: Aram Candela M.D.   On: 11/25/2020 17:30    ____________________________________________  PROCEDURES   Procedure(s) performed (including Critical Care):  .1-3 Lead EKG Interpretation Performed by: Shaune Pollack, MD Authorized by: Shaune Pollack, MD     Interpretation: abnormal     ECG rate:  100-120   ECG rate assessment: tachycardic     Rhythm: sinus tachycardia     Ectopy: none     Conduction: normal   Comments:     Indication: Vomiting    ____________________________________________  INITIAL IMPRESSION / MDM / ASSESSMENT AND PLAN / ED COURSE  As part of my medical decision making, I reviewed the following data within the electronic MEDICAL RECORD NUMBER Nursing notes reviewed and incorporated, Old chart reviewed, Notes from prior ED visits, and [place] Controlled Substance Database       *Johnny [name] was evaluated in Emergency Department on 11/25/2020 for the symptoms described in the history of present illness. He was evaluated in the context of the global COVID-19 pandemic, which necessitated consideration that the patient might be at risk for infection with the SARS-CoV-2 virus that causes COVID-19. Institutional protocols  and algorithms that pertain to the evaluation of patients at risk for COVID-19 are in a state of rapid change based on information released by regulatory bodies including the CDC and federal and state organizations. These policies and algorithms were followed during the patient's care in the ED.  Some ED evaluations and interventions may be delayed as a result of limited staffing during the pandemic.*     Medical Decision Making: 65 year old male here with recurrent nausea, vomiting, and inability to tolerate p.o.  Recent EGD showed severe esophagitis.  No evidence of bleeding clinically or on lab work.  He does appear dehydrated with elevated BUN to creatinine ratio.  Otherwise, labs are overall reassuring.  Despite this, patient is markedly dry on exam with persistent tachycardia, hypotension, and vomiting despite antiemetics.  Will admit for symptomatic control and rehydration.  Fortunately, patient shows no signs of DKA.  His abdomen is otherwise soft and only painful after vomiting and do not feel repeat CT imaging would be indicated at this time.  Could consider discussing again with GI as they recently performed EGD as an inpatient.  ____________________________________________  FINAL CLINICAL IMPRESSION(S) / ED DIAGNOSES  Final diagnoses:  Dehydration  Non-intractable vomiting with nausea, unspecified vomiting type     MEDICATIONS GIVEN DURING THIS VISIT:  Medications  pantoprazole (PROTONIX) 80 mg in sodium chloride 0.9 % 100 mL IVPB (80 mg Intravenous New Bag/Given 11/25/20 1803)  lactated ringers bolus 1,000 mL (has no administration in time range)  lactated ringers bolus 1,000 mL (1,000 mLs Intravenous New Bag/Given 11/25/20 1702)  famotidine (PEPCID) IVPB 20 mg premix (0  mg Intravenous Stopped 11/25/20 1734)  alum & mag hydroxide-simeth (MAALOX/MYLANTA) 200-200-20 MG/5ML suspension 30 mL (30 mLs Oral Given 11/25/20 1708)    And  lidocaine (XYLOCAINE) 2 % viscous mouth solution 15 mL  (15 mLs Oral Given 11/25/20 1708)  metoCLOPramide (REGLAN) injection 10 mg (10 mg Intravenous Given 11/25/20 1735)  diphenhydrAMINE (BENADRYL) injection 25 mg (25 mg Intravenous Given 11/25/20 1735)     ED Discharge Orders    None       Note:  This document was prepared using Dragon voice recognition software and may include unintentional dictation errors.   Shaune Pollack, MD 11/25/20 [phone]

## 2020-11-25 NOTE — Plan of Care (Signed)
New admit from ED. VSS, NAD observed. Pt on tele and cont pulse ox. Pt given scheduled behavioral meds once admitted. Pt's anxiety and aggressive language subsided since given scheduled medication from group home. Pt continent of bowel and bladder. Pt N&V treated with medication. See assessment for details. Pt on fall precautions.   Problem: Education: Goal: Knowledge of General Education information will improve Description: Including pain rating scale, medication(s)/side effects and non-pharmacologic comfort measures Outcome: Progressing   Problem: Health Behavior/Discharge Planning: Goal: Ability to manage health-related needs will improve Outcome: Progressing   Problem: Clinical Measurements: Goal: Ability to maintain clinical measurements within normal limits will improve Outcome: Progressing Goal: Will remain free from infection Outcome: Progressing Goal: Diagnostic test results will improve Outcome: Progressing Goal: Respiratory complications will improve Outcome: Progressing Goal: Cardiovascular complication will be avoided Outcome: Progressing   Problem: Activity: Goal: Risk for activity intolerance will decrease Outcome: Progressing   Problem: Nutrition: Goal: Adequate nutrition will be maintained Outcome: Progressing   Problem: Coping: Goal: Level of anxiety will decrease Outcome: Progressing   Problem: Elimination: Goal: Will not experience complications related to bowel motility Outcome: Progressing Goal: Will not experience complications related to urinary retention Outcome: Progressing   Problem: Pain Managment: Goal: General experience of comfort will improve Outcome: Progressing   Problem: Safety: Goal: Ability to remain free from injury will improve Outcome: Progressing   Problem: Skin Integrity: Goal: Risk for impaired skin integrity will decrease Outcome: Progressing   Problem: Nutrition Goal: Nutritional status is improving Description:  Monitor and assess patient for malnutrition (ex- brittle hair, bruises, dry skin, pale skin and conjunctiva, muscle wasting, smooth red tongue, and disorientation). Collaborate with interdisciplinary team and initiate plan and interventions as ordered.  Monitor patient's weight and dietary intake as ordered or per policy. Utilize nutrition screening tool and intervene per policy. Determine patient's food preferences and provide high-protein, high-caloric foods as appropriate.  Outcome: Progressing   Problem: Nutrition Goal: Patient maintains adequate hydration Outcome: Progressing Goal: Patient maintains weight Outcome: Progressing Goal: Patient/Family demonstrates understanding of diet Outcome: Progressing Goal: Patient/Family independently completes tube feeding Outcome: Progressing Goal: Patient will have no more than 5 lb weight change during LOS Outcome: Progressing Goal: Patient will utilize adaptive techniques to administer nutrition Outcome: Progressing Goal: Patient will verbalize dietary restrictions Outcome: Progressing   Problem: Education: Goal: Ability to state activities that reduce stress will improve Outcome: Progressing   Problem: Coping: Goal: Ability to identify and develop effective coping behavior will improve Outcome: Progressing   Problem: Self-Concept: Goal: Ability to identify factors that promote anxiety will improve Outcome: Progressing Goal: Level of anxiety will decrease Outcome: Progressing Goal: Ability to modify response to factors that promote anxiety will improve Outcome: Progressing

## 2020-11-25 NOTE — ED Notes (Signed)
Pt brought in by care giver at group home.  Pt has n/v.  Pt denies abd pain or chest pain.  States feeling better now.  Former smoker   Pt alert.

## 2020-11-25 NOTE — H&P (Signed)
Johnny [name] JGO:[id number] DOB: [birth year] DOA: 11/25/2020     PCP: Franciso Bend, NP   Outpatient Specialists:     GI Dr. Servando Snare    Patient arrived to ER on 11/25/20 at 1523 Referred by Attending Shaune Pollack, MD   Patient coming from: group home   Chief Complaint:   Chief Complaint  Patient presents with  . Dehydration    HPI: Johnny [name] is a 65 y.o. male with medical history significant of esophagitis, DM2, schizophrenia, HTN    Presented with N/V hypotesive 70/50 while in Group home, was tachycardic not tolerating PO Was seen by PCP today and sent to ER  Patient has been prescribed antiacids and antiemetics but still unable to tolerate p.o.'s.  States food comes back immediately after he tries to eat.  He also has significant burning sensation  Admitted on 11/11/20 with DKA n/V esophagitis has been coming back with reccurent n/V EGD-found with LA grade D reflux esophagitis with no bleeding.  Gastritis.  Normal examined duodenum. Patient usually takes metoprolol but this has been taken off last week due to recurrent hypotension  Quit smoking and no EtOH  Infectious risk factors:  Reports shortness of breath,  N/V/    Has   been vaccinated against COVID  and boosted   Initial COVID TEST  NEGATIVE   Lab Results  Component Value Date   SARSCOV2NAA NEGATIVE 11/25/2020   SARSCOV2NAA NEGATIVE 11/11/2020   SARSCOV2NAA NEGATIVE 06/17/2020   SARSCOV2NAA NEGATIVE 06/15/2020    Regarding pertinent Chronic problems:    Hyperlipidemia -  on statins  pravachol Lipid Panel     Component Value Date/Time   CHOL 159 11/14/2008 0141   TRIG 97 11/14/2008 0141   HDL 51 11/14/2008 0141   CHOLHDL 3.1 Ratio 11/14/2008 0141   VLDL 19 11/14/2008 0141   LDLCALC 89 11/14/2008 0141     HTN on  Benazapril     DM 2 -  Lab Results  Component Value Date   HGBA1C 7.7 (H) 11/11/2020  on insulin, and po meds      Asthma -well controlled on home inhalers      While in ER: Given IV fluids reglan protonix and fluids     ED Triage Vitals  Enc Vitals Group     BP 11/25/20 1606 91/74     Pulse Rate 11/25/20 1606 (!) 125     Resp 11/25/20 1606 18     Temp 11/25/20 1610 99.1 F (37.3 C)     Temp Source 11/25/20 1610 Oral     SpO2 11/25/20 1606 99 %     Weight --      Height --      Head Circumference --      Peak Flow --      Pain Score 11/25/20 1611 4     Pain Loc --      Pain Edu? --      Excl. in GC? --   TMAX(24)@     _________________________________________ Significant initial  Findings: Abnormal Labs Reviewed  COMPREHENSIVE METABOLIC PANEL - Abnormal; Notable for the following components:      Result Value   Sodium 133 (*)    Chloride 95 (*)    Glucose, Bld 229 (*)    BUN 31 (*)    Albumin 3.4 (*)    AST 11 (*)    All other components within normal limits  CBC WITH DIFFERENTIAL/PLATELET - Abnormal; Notable for the following  components:   WBC 11.0 (*)    Monocytes Absolute 1.2 (*)    Abs Immature Granulocytes 0.10 (*)    All other components within normal limits   ____________________________________________ Ordered   CXR - NON acute  ________________ Troponin 6 ECG: Ordered Personally reviewed by me showing: HR : 126 Rhythm: Sinus tachycardia    , no evidence of ischemic changes QTC 496 __________ The recent clinical data is shown below. Vitals:   11/25/20 1606 11/25/20 1610 11/25/20 1654  BP: 91/74  107/60  Pulse: (!) 125  (!) 120  Resp: 18  18  Temp:  99.1 F (37.3 C)   TempSrc:  Oral   SpO2: 99%  97%    WBC     Component Value Date/Time   WBC 11.0 (H) 11/25/2020 1630   LYMPHSABS 2.1 11/25/2020 1630    Lactic Acid, Venous    Component Value Date/Time   LATICACIDVEN 1.5 11/25/2020 1703     UA  not ordered    Results for orders placed or performed during the hospital encounter of 11/25/20  Resp Panel by RT-PCR (Flu A&B, Covid) Nasopharyngeal Swab     Status: None   Collection Time: 11/25/20   5:03 PM   Specimen: Nasopharyngeal Swab; Nasopharyngeal(NP) swabs in vial transport medium  Result Value Ref Range Status   SARS Coronavirus 2 by RT PCR NEGATIVE NEGATIVE Final         Influenza A by PCR NEGATIVE NEGATIVE Final   Influenza B by PCR NEGATIVE NEGATIVE Final          _______________________________________________ Hospitalist was called for admission for dehydration  The following Work up has been ordered so far:  Orders Placed This Encounter  Procedures  . 1-3 Lead EKG Interpretation  . Resp Panel by RT-PCR (Flu A&B, Covid) Nasopharyngeal Swab  . DG Chest 2 View  . Comprehensive metabolic panel  . CBC with Differential  . Lipase, blood  . Lactic acid, plasma  . Magnesium  . Document Height and Actual Weight  . Consult to hospitalist  . Airborne and Contact precautions  . ED EKG    Following Medications were ordered in ER: Medications  lactated ringers bolus 1,000 mL (has no administration in time range)  lactated ringers bolus 1,000 mL (1,000 mLs Intravenous New Bag/Given 11/25/20 1702)  pantoprazole (PROTONIX) 80 mg in sodium chloride 0.9 % 100 mL IVPB (80 mg Intravenous New Bag/Given 11/25/20 1803)  famotidine (PEPCID) IVPB 20 mg premix (0 mg Intravenous Stopped 11/25/20 1734)  alum & mag hydroxide-simeth (MAALOX/MYLANTA) 200-200-20 MG/5ML suspension 30 mL (30 mLs Oral Given 11/25/20 1708)    And  lidocaine (XYLOCAINE) 2 % viscous mouth solution 15 mL (15 mLs Oral Given 11/25/20 1708)  metoCLOPramide (REGLAN) injection 10 mg (10 mg Intravenous Given 11/25/20 1735)  diphenhydrAMINE (BENADRYL) injection 25 mg (25 mg Intravenous Given 11/25/20 1735)        Consult Orders  (From admission, onward)         Start     Ordered   11/25/20 1803  Consult to hospitalist  Once       Provider:  (Not yet assigned)  Question Answer Comment  Place call to: Hospitalist   Reason for Consult Admit      11/25/20 1803          OTHER Significant initial   Findings:  labs showing:    Recent Labs  Lab 11/25/20 1630  NA 133*  K 4.5  CO2 27  GLUCOSE  229*  BUN 31*  CREATININE 1.17  CALCIUM 9.4  MG 1.9   Cr    stable,    from baseline see below Lab Results  Component Value Date   CREATININE 1.17 11/25/2020   CREATININE 1.05 11/12/2020   CREATININE 1.34 (H) 11/11/2020    Recent Labs  Lab 11/25/20 1630  AST 11*  ALT 10  ALKPHOS 71  BILITOT 0.9  PROT 6.6  ALBUMIN 3.4*   Lab Results  Component Value Date   CALCIUM 9.4 11/25/2020       Plt: Lab Results  Component Value Date   PLT 284 11/25/2020      Recent Labs  Lab 11/25/20 1630  WBC 11.0*  NEUTROABS 7.5  HGB 14.4  HCT 42.4  MCV 87.6  PLT 284    HG/HCT  stable,      Component Value Date/Time   HGB 14.4 11/25/2020 1630   HCT 42.4 11/25/2020 1630   MCV 87.6 11/25/2020 1630      Recent Labs  Lab 11/25/20 1630  LIPASE 33   No results for input(s): AMMONIA in the last 168 hours.   Cardiac Panel (last 3 results) Recent Labs    11/25/20 1638  CKTOTAL 35*    DM  labs:  HbA1C: Recent Labs    06/17/20 1415 11/11/20 1637  HGBA1C 8.0* 7.7*     CBG (last 3)  No results for input(s): GLUCAP in the last 72 hours.       Cultures:    Component Value Date/Time   SDES BLOOD LEFT ANTECUBITAL 06/17/2020 1512   SPECREQUEST  06/17/2020 1512    BOTTLES DRAWN AEROBIC AND ANAEROBIC Blood Culture adequate volume   CULT  06/17/2020 1512    NO GROWTH 5 DAYS Performed at [facility], [address] [place], [place] [id number]    REPTSTATUS 06/22/2020 FINAL 06/17/2020 1512     Radiological Exams on Admission: DG Chest 2 View  Result Date: 11/25/2020 CLINICAL DATA:  Vomiting. EXAM: CHEST - 2 VIEW COMPARISON:  November 10, 2020 FINDINGS: The lungs are mildly hyperinflated. Stable linear scarring is seen within the mid right lung and lateral aspect of the right lung base. Mild to moderate severity atelectasis and/or infiltrate is noted within the  posterior aspect of the right lower lobe. A small right pleural effusion is seen. No pneumothorax is identified. The heart size and mediastinal contours are within normal limits. There is moderate severity calcification of the aortic arch. Radiopaque surgical clips are seen within the right upper quadrant. The visualized skeletal structures are unremarkable. IMPRESSION: 1. Mild to moderate severity right lower lobe scarring and/or atelectasis with small right pleural effusion. Electronically Signed   By: Aram Candelahaddeus  Houston M.D.   On: 11/25/2020 17:30   _______________________________________________________________________________________________________ Latest  Blood pressure 107/60, pulse (!) 120, temperature 99.1 F (37.3 C), temperature source Oral, resp. rate 18, SpO2 97 %.   Review of Systems:    Pertinent positives include:  abdominal pain, nausea, vomiting, chest pain Constitutional:  No weight loss, night sweats, Fevers, chills, fatigue, weight loss  HEENT:  No headaches, Difficulty swallowing,Tooth/dental problems,Sore throat,  No sneezing, itching, ear ache, nasal congestion, post nasal drip,  Cardio-vascular:  No , Orthopnea, PND, anasarca, dizziness, palpitations.no Bilateral lower extremity swelling  GI:  No heartburn, indigestion,  diarrhea, change in bowel habits, loss of appetite, melena, blood in stool, hematemesis Resp:  no shortness of breath at rest. No dyspnea on exertion, No excess mucus, no productive cough, No  non-productive cough, No coughing up of blood.No change in color of mucus.No wheezing. Skin:  no rash or lesions. No jaundice GU:  no dysuria, change in color of urine, no urgency or frequency. No straining to urinate.  No flank pain.  Musculoskeletal:  No joint pain or no joint swelling. No decreased range of motion. No back pain.  Psych:  No change in mood or affect. No depression or anxiety. No memory loss.  Neuro: no localizing neurological complaints,  no tingling, no weakness, no double vision, no gait abnormality, no slurred speech, no confusion  All systems reviewed and apart from HOPI all are negative _______________________________________________________________________________________________ Past Medical History:   Past Medical History:  Diagnosis Date  . Anemia   . Clostridium difficile colitis   . Diabetes mellitus without complication (HCC)   . DM (diabetes mellitus) (HCC)   . GERD (gastroesophageal reflux disease)   . Herpes   . Hyperlipidemia   . Hypertension   . Schizophrenia [facility])      Past Surgical History:  Procedure Laterality Date  . CHOLECYSTECTOMY    . ESOPHAGOGASTRODUODENOSCOPY (EGD) WITH PROPOFOL N/A 11/12/2020   Procedure: ESOPHAGOGASTRODUODENOSCOPY (EGD) WITH PROPOFOL;  Surgeon: Midge Minium, MD;  Location: [facility] ENDOSCOPY;  Service: Endoscopy;  Laterality: N/A;    Social History:  Ambulatory  Independently      reports that he has been smoking cigarettes. He has been smoking about 1.00 pack per day. He has never used smokeless tobacco. He reports that he does not drink alcohol and does not use drugs.   Family History:   Family History  Problem Relation Age of Onset  . Diabetes Mother   . Kidney failure Mother   . Diabetes Father   . CAD Brother    ______________________________________________________________________________________________ Allergies: Allergies  Allergen Reactions  . Fish Allergy Rash  . Shellfish Allergy Rash     Prior to Admission medications   Medication Sig Start Date End Date Taking? Authorizing Provider  albuterol (PROVENTIL HFA;VENTOLIN HFA) 108 (90 BASE) MCG/ACT inhaler Inhale 2 puffs into the lungs every 4 (four) hours as needed for wheezing or shortness of breath.    [provider]  ammonium lactate (AMLACTIN) 12 % cream Apply 1 g topically daily.     [provider]  benazepril (LOTENSIN) 20 MG tablet Take 20 mg by mouth daily. 06/11/20    [provider]  benztropine (COGENTIN) 1 MG tablet Take 1 mg by mouth 2 (two) times daily.    [provider]  BREZTRI AEROSPHERE 160-9-4.8 MCG/ACT AERO Inhale 2 puffs into the lungs 2 (two) times daily. 05/14/20   [provider]  buPROPion (WELLBUTRIN XL) 300 MG 24 hr tablet Take 300 mg by mouth daily. 06/11/20   [provider]  Canagliflozin-Metformin HCl (INVOKAMET) 50-1000 MG TABS Take 1 tablet by mouth 2 (two) times daily with a meal.    [provider]  clonazePAM (KLONOPIN) 1 MG tablet Take 1 mg by mouth 3 (three) times daily.    [provider]  colesevelam (WELCHOL) 625 MG tablet Take 1,875 mg by mouth 2 (two) times daily.     [provider]  divalproex (DEPAKOTE ER) 500 MG 24 hr tablet Take 500 mg by mouth 2 (two) times daily.     [provider]  fluPHENAZine (PROLIXIN) 5 MG tablet Take 5 mg by mouth 2 (two) times daily. Pt also takes daily as needed for psychosis/agitation.    [provider]  insulin glargine (LANTUS) 100  UNIT/ML injection Inject 30 Units into the skin at bedtime.    [provider]  INVEGA TRINZA 819 MG/2.625ML injection Inject 819 mg into the muscle every 30 (thirty) days. 06/01/20   [provider]  LINZESS 145 MCG CAPS capsule Take 145 mcg by mouth daily. 06/11/20   [provider]  Memantine HCl-Donepezil HCl (NAMZARIC) 28-10 MG CP24 Take 1 capsule by mouth at bedtime.     [provider]  pantoprazole (PROTONIX) 40 MG tablet Take 1 tablet (40 mg total) by mouth 2 (two) times daily. 11/13/20 12/13/20  Lynn Ito, MD  pioglitazone (ACTOS) 45 MG tablet Take 45 mg by mouth daily.    [provider]  polyethylene glycol (MIRALAX / GLYCOLAX) packet Take 17 g by mouth daily.    [provider]  pravastatin (PRAVACHOL) 40 MG tablet Take 40 mg by mouth at bedtime.     [provider]  QUEtiapine (SEROQUEL) 400 MG tablet Take 600 mg by  mouth at bedtime.    [provider]  sitaGLIPtin (JANUVIA) 100 MG tablet Take 100 mg by mouth daily.    [provider]  SYNJARDY 12.01-999 MG TABS Take 1 tablet by mouth 2 (two) times daily. 06/11/20   [provider]  tiotropium (SPIRIVA) 18 MCG inhalation capsule Place 18 mcg into inhaler and inhale daily.    [provider]  traZODone (DESYREL) 100 MG tablet Take 200 mg by mouth at bedtime.    [provider]  Vitamin D, Ergocalciferol, (DRISDOL) 50000 UNITS CAPS capsule Take 50,000 Units by mouth every 7 (seven) days.    [provider]   ___________________________________________________________________________________________________ Physical Exam: Vitals with BMI 11/25/2020 11/25/2020 11/23/2020  Height - - -  Weight - - -  BMI - - -  Systolic 107 91 120  Diastolic 60 74 71  Pulse 120 125 114    1. General:  in No  Acute distress   Chronically ill  -appearing 2. Psychological: Alert and  Oriented 3. Head/ENT:     Dry Mucous Membranes                          Head Non traumatic, neck supple                            Poor Dentition 4. SKIN:   decreased Skin turgor,  Skin clean Dry and intact no rash 5. Heart: Regular rate and rhythm no  Murmur, no Rub or gallop 6. Lungs:  no wheezes or crackles   7. Abdomen: Soft,  non-tender, Non distended bowel sounds present 8. Lower extremities: no clubbing, cyanosis, no  edema 9. Neurologically Grossly intact, moving all 4 extremities equally   10. MSK: Normal range of motion    Chart has been reviewed  ______________________________________________________________________________________________  Assessment/Plan   65 y.o. male with medical history significant of esophagitis, DM2, schizophrenia, HTN Admitted for dehydration  Present on Admission: . Dehydration - rehydrate and follow fluid status  . Esophagitis -PPI bid, NPO pmn, Gi reconsult  . Uncontrolled type 2 diabetes  mellitus (HCC) -  - Order Sensitive  SSI   - continue home insulin regimen decreased  Lantus 20 units, while NPO  -  check TSH and HgA1C  - Hold by mouth medications     . Schizophrenia (HCC) - stable continue home meds  . Intractable vomiting - supportive measures  . GERD  _ PPI  . Essential hypertension - allow permissive HTn for tonight   Other plan as per orders.  DVT prophylaxis:  SCD       Code Status:    Code Status: Prior FULL CODE  as per patient  I had personally discussed CODE STATUS with patient    Family Communication:   Family not at  Bedside    Disposition Plan:                            Back to current facility when stable                                Following barriers for discharge:                            Electrolytes corrected                                                           Pain controlled with PO medications                                                           Will need to be able to tolerate PO                                                        Will need consultants to evaluate patient prior to discharge                      Would benefit from PT/OT eval prior to DC  Ordered                   Swallow eval - SLP ordered                   Diabetes care coordinator                   Transition of care consulted                   Nutrition    consulted                                      Consults called: GI is aware Dr. Tobi Bastos  Admission status:  ED Disposition    ED Disposition Condition Comment   Admit  Hospital Area: [facility] [[id number]]  Level of Care: Progressive Cardiac [[id number]]  Admit to Progressive based on following criteria: CARDIOVASCULAR & THORACIC of moderate stability with acute coronary syndrome symptoms/low risk myocardial infarction/hypertensive urgency/arrhythmias/heart failure potentially compromising stability and stable post cardiovascular intervention patients.  Covid Evaluation:  Confirmed COVID Negative  Diagnosis: Dehydration [276.51.ICD-9-CM]  Admitting Physician: Therisa Doyne [[id number]]  Attending Physician: Therisa Doyne [[id number]]        Obs   Level of car progressive tele indefinitely please discontinue once patient no longer qualifies COVID-19 Labs    Lab Results  Component Value Date   SARSCOV2NAA NEGATIVE 11/25/2020     Precautions: admitted as  Covid Negative   PPE: Used by the provider:   N95   eye Goggles,  Gloves     Tyri Elmore 11/25/2020, 8:49 PM    [facility]     after 2 AM please page floor coverage PA If 7AM-7PM, please contact the day team taking care of the patient using [email or web address]   Patient was evaluated in the context of the global COVID-19 pandemic, which necessitated consideration that the patient might be at risk for infection with the SARS-CoV-2 virus that causes COVID-19. Institutional protocols and algorithms that pertain to the evaluation of patients at risk for COVID-19 are in a state of rapid change based on information released by regulatory bodies including the CDC and federal and state organizations. These policies and algorithms were followed during the patient's care.

## 2020-11-25 NOTE — ED Notes (Signed)
Report messaged to traci rn floor nurse

## 2020-11-25 NOTE — ED Triage Notes (Signed)
Pt was sent by PCP for fluids- pt was seen there today and per PCP note pt has not been tolerating PO fluids, HR elevated, and bp of 98/74- pt is to be scheduled for upper endoscopy and has a referral to cards from PCP

## 2020-11-26 DIAGNOSIS — Z794 Long term (current) use of insulin: Secondary | ICD-10-CM | POA: Diagnosis not present

## 2020-11-26 DIAGNOSIS — E1165 Type 2 diabetes mellitus with hyperglycemia: Secondary | ICD-10-CM | POA: Diagnosis present

## 2020-11-26 DIAGNOSIS — F0390 Unspecified dementia without behavioral disturbance: Secondary | ICD-10-CM | POA: Diagnosis present

## 2020-11-26 DIAGNOSIS — Z79899 Other long term (current) drug therapy: Secondary | ICD-10-CM | POA: Diagnosis not present

## 2020-11-26 DIAGNOSIS — K5901 Slow transit constipation: Secondary | ICD-10-CM | POA: Diagnosis present

## 2020-11-26 DIAGNOSIS — F209 Schizophrenia, unspecified: Secondary | ICD-10-CM | POA: Diagnosis not present

## 2020-11-26 DIAGNOSIS — K209 Esophagitis, unspecified without bleeding: Secondary | ICD-10-CM | POA: Diagnosis not present

## 2020-11-26 DIAGNOSIS — E1169 Type 2 diabetes mellitus with other specified complication: Secondary | ICD-10-CM | POA: Diagnosis present

## 2020-11-26 DIAGNOSIS — E785 Hyperlipidemia, unspecified: Secondary | ICD-10-CM | POA: Diagnosis present

## 2020-11-26 DIAGNOSIS — K208 Other esophagitis without bleeding: Secondary | ICD-10-CM | POA: Diagnosis not present

## 2020-11-26 DIAGNOSIS — F419 Anxiety disorder, unspecified: Secondary | ICD-10-CM | POA: Diagnosis present

## 2020-11-26 DIAGNOSIS — I9589 Other hypotension: Secondary | ICD-10-CM | POA: Diagnosis present

## 2020-11-26 DIAGNOSIS — Z6822 Body mass index (BMI) 22.0-22.9, adult: Secondary | ICD-10-CM | POA: Diagnosis not present

## 2020-11-26 DIAGNOSIS — J45909 Unspecified asthma, uncomplicated: Secondary | ICD-10-CM | POA: Diagnosis present

## 2020-11-26 DIAGNOSIS — R112 Nausea with vomiting, unspecified: Secondary | ICD-10-CM | POA: Diagnosis not present

## 2020-11-26 DIAGNOSIS — Z7984 Long term (current) use of oral hypoglycemic drugs: Secondary | ICD-10-CM | POA: Diagnosis not present

## 2020-11-26 DIAGNOSIS — E861 Hypovolemia: Secondary | ICD-10-CM | POA: Diagnosis present

## 2020-11-26 DIAGNOSIS — I10 Essential (primary) hypertension: Secondary | ICD-10-CM | POA: Diagnosis present

## 2020-11-26 DIAGNOSIS — Z87891 Personal history of nicotine dependence: Secondary | ICD-10-CM | POA: Diagnosis not present

## 2020-11-26 DIAGNOSIS — Z841 Family history of disorders of kidney and ureter: Secondary | ICD-10-CM | POA: Diagnosis not present

## 2020-11-26 DIAGNOSIS — K2100 Gastro-esophageal reflux disease with esophagitis, without bleeding: Secondary | ICD-10-CM | POA: Diagnosis present

## 2020-11-26 DIAGNOSIS — E440 Moderate protein-calorie malnutrition: Secondary | ICD-10-CM | POA: Diagnosis present

## 2020-11-26 DIAGNOSIS — K5900 Constipation, unspecified: Secondary | ICD-10-CM

## 2020-11-26 DIAGNOSIS — Z91013 Allergy to seafood: Secondary | ICD-10-CM | POA: Diagnosis not present

## 2020-11-26 DIAGNOSIS — Z9049 Acquired absence of other specified parts of digestive tract: Secondary | ICD-10-CM | POA: Diagnosis not present

## 2020-11-26 DIAGNOSIS — Z20822 Contact with and (suspected) exposure to covid-19: Secondary | ICD-10-CM | POA: Diagnosis present

## 2020-11-26 DIAGNOSIS — F32A Depression, unspecified: Secondary | ICD-10-CM | POA: Diagnosis present

## 2020-11-26 DIAGNOSIS — E860 Dehydration: Secondary | ICD-10-CM | POA: Diagnosis present

## 2020-11-26 LAB — COMPREHENSIVE METABOLIC PANEL
ALT: 9 U/L (ref 0–44)
AST: 11 U/L — ABNORMAL LOW (ref 15–41)
Albumin: 3 g/dL — ABNORMAL LOW (ref 3.5–5.0)
Alkaline Phosphatase: 58 U/L (ref 38–126)
Anion gap: 6 (ref 5–15)
BUN: 20 mg/dL (ref 8–23)
CO2: 29 mmol/L (ref 22–32)
Calcium: 9 mg/dL (ref 8.9–10.3)
Chloride: 99 mmol/L (ref 98–111)
Creatinine, Ser: 0.85 mg/dL (ref 0.61–1.24)
GFR, Estimated: >60 mL/min (ref >60)
Glucose, Bld: 135 mg/dL — ABNORMAL HIGH (ref 70–99)
Potassium: 4.7 mmol/L (ref 3.5–5.1)
Sodium: 134 mmol/L — ABNORMAL LOW (ref 135–145)
Total Bilirubin: 0.6 mg/dL (ref 0.3–1.2)
Total Protein: 5.6 g/dL — ABNORMAL LOW (ref 6.5–8.1)

## 2020-11-26 LAB — CBC WITH DIFFERENTIAL/PLATELET
Abs Immature Granulocytes: 0.1 10*3/uL — ABNORMAL HIGH (ref 0.00–0.07)
Basophils Absolute: 0 10*3/uL (ref 0.0–0.1)
Basophils Relative: 0 %
Eosinophils Absolute: 0.1 10*3/uL (ref 0.0–0.5)
Eosinophils Relative: 0 %
HCT: 37.3 % — ABNORMAL LOW (ref 39.0–52.0)
Hemoglobin: 12.3 g/dL — ABNORMAL LOW (ref 13.0–17.0)
Immature Granulocytes: 1 %
Lymphocytes Relative: 24 %
Lymphs Abs: 2.7 10*3/uL (ref 0.7–4.0)
MCH: 29.2 pg (ref 26.0–34.0)
MCHC: 33 g/dL (ref 30.0–36.0)
MCV: 88.6 fL (ref 80.0–100.0)
Monocytes Absolute: 1.5 10*3/uL — ABNORMAL HIGH (ref 0.1–1.0)
Monocytes Relative: 13 %
Neutro Abs: 6.9 10*3/uL (ref 1.7–7.7)
Neutrophils Relative %: 62 %
Platelets: 230 10*3/uL (ref 150–400)
RBC: 4.21 MIL/uL — ABNORMAL LOW (ref 4.22–5.81)
RDW: 15 % (ref 11.5–15.5)
WBC: 11.2 10*3/uL — ABNORMAL HIGH (ref 4.0–10.5)
nRBC: 0 % (ref 0.0–0.2)

## 2020-11-26 LAB — GLUCOSE, CAPILLARY
Glucose-Capillary: 353 mg/dL — ABNORMAL HIGH (ref 70–99)
Glucose-Capillary: 61 mg/dL — ABNORMAL LOW (ref 70–99)
Glucose-Capillary: 101 mg/dL — ABNORMAL HIGH (ref 70–99)
Glucose-Capillary: 177 mg/dL — ABNORMAL HIGH (ref 70–99)
Glucose-Capillary: 198 mg/dL — ABNORMAL HIGH (ref 70–99)
Glucose-Capillary: 327 mg/dL — ABNORMAL HIGH (ref 70–99)

## 2020-11-26 LAB — MAGNESIUM: Magnesium: 2.1 mg/dL (ref 1.7–2.4)

## 2020-11-26 LAB — MRSA PCR SCREENING: MRSA by PCR: NEGATIVE

## 2020-11-26 LAB — PHOSPHORUS: Phosphorus: 2.3 mg/dL — ABNORMAL LOW (ref 2.5–4.6)

## 2020-11-26 LAB — TSH: TSH: 2.462 u[IU]/mL (ref 0.350–4.500)

## 2020-11-26 MED ORDER — SORBITOL 70 % SOLN
960.0000 mL | TOPICAL_OIL | Freq: Once | ORAL | Status: AC
  Administered 2020-11-26: 960 mL via RECTAL
  Filled 2020-11-26: qty 473

## 2020-11-26 MED ORDER — ADULT MULTIVITAMIN W/MINERALS CH
1.0000 | ORAL_TABLET | Freq: Every day | ORAL | Status: DC
  Administered 2020-11-27 – 2020-11-28 (×2): 1 via ORAL
  Filled 2020-11-26 (×2): qty 1

## 2020-11-26 MED ORDER — ENSURE MAX PROTEIN PO LIQD
11.0000 [oz_av] | Freq: Two times a day (BID) | ORAL | Status: DC
  Administered 2020-11-26 – 2020-11-28 (×5): 11 [oz_av] via ORAL
  Filled 2020-11-26: qty 330

## 2020-11-26 NOTE — Consult Note (Signed)
Melodie Bouillon, MD [address], [place], [place], [address] [address], [address], [place], [place], [address] Phone: [phone]  Fax: [phone]  Consultation  Referring Provider:     Dr. Butler Denmark Primary Care Physician:  Franciso Bend, NP Reason for Consultation:     Esophagitis  Date of Admission:  11/25/2020 Date of Consultation:  11/26/2020         HPI:   Johnny [name] is a 65 y.o. male who recently underwent upper endoscopy 2 weeks ago due to intractable nausea vomiting, poor p.o. intake, coffee-ground emesis.  EGD showed severe esophagitis.  Patient was placed on PPI.  Patient states he has not been taking the medication at home.  He has history of schizophrenia.  He denies any episodes of bleeding.  Past Medical History:  Diagnosis Date  . Anemia   . Clostridium difficile colitis   . Diabetes mellitus without complication (HCC)   . DM (diabetes mellitus) (HCC)   . GERD (gastroesophageal reflux disease)   . Herpes   . Hyperlipidemia   . Hypertension   . Schizophrenia [facility])     Past Surgical History:  Procedure Laterality Date  . CHOLECYSTECTOMY    . ESOPHAGOGASTRODUODENOSCOPY (EGD) WITH PROPOFOL N/A 11/12/2020   Procedure: ESOPHAGOGASTRODUODENOSCOPY (EGD) WITH PROPOFOL;  Surgeon: Midge Minium, MD;  Location: [facility] ENDOSCOPY;  Service: Endoscopy;  Laterality: N/A;    Prior to Admission medications   Medication Sig Start Date End Date Taking? Authorizing Provider  albuterol (PROVENTIL HFA;VENTOLIN HFA) 108 (90 BASE) MCG/ACT inhaler Inhale 2 puffs into the lungs every 4 (four) hours as needed for wheezing or shortness of breath.    [provider]  ammonium lactate (AMLACTIN) 12 % cream Apply 1 g topically daily.     [provider]  benazepril (LOTENSIN) 20 MG tablet Take 20 mg by mouth daily. 06/11/20   [provider]  benztropine (COGENTIN) 1 MG tablet Take 1 mg by mouth 2 (two) times daily.    [provider]  BREZTRI AEROSPHERE 160-9-4.8 MCG/ACT AERO Inhale 2 puffs into the lungs 2 (two) times daily. 05/14/20   [provider]  buPROPion (WELLBUTRIN XL) 300 MG 24 hr tablet Take 300 mg by mouth daily. 06/11/20   [provider]  Canagliflozin-Metformin HCl (INVOKAMET) 50-1000 MG TABS Take 1 tablet by mouth 2 (two) times daily with a meal.    [provider]  clonazePAM (KLONOPIN) 1 MG tablet Take 1 mg by mouth 3 (three) times daily.    [provider]  colesevelam (WELCHOL) 625 MG tablet Take 1,875 mg by mouth 2 (two) times daily.     [provider]  divalproex (DEPAKOTE ER) 500 MG 24 hr tablet Take 500 mg by mouth 2 (two) times daily.     [provider]  fluPHENAZine (PROLIXIN) 5 MG tablet Take 5 mg by mouth 2 (two) times daily. Pt also takes daily as needed for psychosis/agitation.    [provider]  insulin glargine (LANTUS) 100 UNIT/ML injection Inject 30 Units into the skin at bedtime.    [provider]  INVEGA TRINZA 819 MG/2.625ML injection Inject 819 mg into the muscle every 30 (thirty) days. 06/01/20   [provider]  LINZESS 145 MCG CAPS capsule Take 145 mcg by mouth daily. 06/11/20   [provider]  Memantine HCl-Donepezil HCl (NAMZARIC) 28-10 MG CP24 Take 1 capsule by mouth at bedtime.     [provider]  pantoprazole (  PROTONIX) 40 MG tablet Take 1 tablet (40 mg total) by mouth 2 (two) times daily. 11/13/20 12/13/20  Lynn Ito, MD  pioglitazone (ACTOS) 45 MG tablet Take 45 mg by mouth daily.    [provider]  polyethylene glycol (MIRALAX / GLYCOLAX) packet Take 17 g by mouth daily.    [provider]  pravastatin (PRAVACHOL) 40 MG tablet Take 40 mg by mouth at bedtime.     [provider]  QUEtiapine (SEROQUEL) 400 MG tablet Take 600 mg by mouth at bedtime.    [provider]  sitaGLIPtin (JANUVIA) 100 MG tablet Take 100 mg by mouth daily.    [provider]  SYNJARDY 12.01-999 MG TABS Take 1 tablet by mouth 2 (two) times daily. 06/11/20   [provider]  tiotropium (SPIRIVA) 18 MCG inhalation capsule Place 18 mcg into inhaler and inhale daily.    [provider]  traZODone (DESYREL) 100 MG tablet Take 200 mg by mouth at bedtime.    [provider]  Vitamin D, Ergocalciferol, (DRISDOL) 50000 UNITS CAPS capsule Take 50,000 Units by mouth every 7 (seven) days.    [provider]    Family History  Problem Relation Age of Onset  . Diabetes Mother   . Kidney failure Mother   . Diabetes Father   . CAD Brother      Social History   Tobacco Use  . Smoking status: Current Every Day Smoker    Packs/day: 1.00    Types: Cigarettes  . Smokeless tobacco: Never Used  . Tobacco comment: d/t cognitive function  Substance Use Topics  . Alcohol use: No  . Drug use: No    Allergies as of 11/25/2020 - Review Complete 11/25/2020  Allergen Reaction Noted  . Fish allergy Rash 03/14/2015  . Shellfish allergy Rash 03/14/2015    Review of Systems:    All systems reviewed and negative except where noted in HPI.   Physical Exam:  Vital signs in last 24 hours: Vitals:   11/26/20 0815 11/26/20 1116 11/26/20 1119 11/26/20 1600  BP: 103/67  127/80 119/82  Pulse: 99  89 89  Resp: 17  12 16   Temp:  97.8 F (36.6 C)  98.1 F (36.7 C)  TempSrc:  Oral  Oral  SpO2: 97%  100% 99%  Weight:      Height:       Last BM Date: 11/24/20 General:   Pleasant, cooperative in NAD Head:  Normocephalic and atraumatic. Eyes:   No icterus.   Conjunctiva pink. PERRLA. Ears:  Normal auditory acuity. Neck:  Supple; no masses or thyroidomegaly Lungs: Respirations even and unlabored. Lungs clear to auscultation bilaterally.   No wheezes, crackles, or rhonchi.  Abdomen:  Soft, nondistended, nontender. Normal bowel sounds. No appreciable masses or hepatomegaly.  No rebound or guarding.  Neurologic:  Alert and oriented  x3;  grossly normal neurologically. Skin:  Intact without significant lesions or rashes. Cervical Nodes:  No significant cervical adenopathy. Psych:  Alert and cooperative. Normal affect.  LAB RESULTS: Recent Labs    11/25/20 1630 11/26/20 0526  WBC 11.0* 11.2*  HGB 14.4 12.3*  HCT 42.4 37.3*  PLT 284 230   BMET Recent Labs    11/25/20 1630 11/26/20 0526  NA 133* 134*  K 4.5 4.7  CL 95* 99  CO2 27 29  GLUCOSE 229* 135*  BUN 31* 20  CREATININE 1.17 0.85  CALCIUM 9.4 9.0   LFT Recent Labs  11/26/20 0526  PROT 5.6*  ALBUMIN 3.0*  AST 11*  ALT 9  ALKPHOS 58  BILITOT 0.6   PT/INR No results for input(s): LABPROT, INR in the last 72 hours.  STUDIES: DG Chest 2 View  Result Date: 11/25/2020 CLINICAL DATA:  Vomiting. EXAM: CHEST - 2 VIEW COMPARISON:  November 10, 2020 FINDINGS: The lungs are mildly hyperinflated. Stable linear scarring is seen within the mid right lung and lateral aspect of the right lung base. Mild to moderate severity atelectasis and/or infiltrate is noted within the posterior aspect of the right lower lobe. A small right pleural effusion is seen. No pneumothorax is identified. The heart size and mediastinal contours are within normal limits. There is moderate severity calcification of the aortic arch. Radiopaque surgical clips are seen within the right upper quadrant. The visualized skeletal structures are unremarkable. IMPRESSION: 1. Mild to moderate severity right lower lobe scarring and/or atelectasis with small right pleural effusion. Electronically Signed   By: Aram Candela M.D.   On: 11/25/2020 17:30   DG Abdomen 1 View  Result Date: 11/25/2020 CLINICAL DATA:  Not tolerating oral liquids. EXAM: ABDOMEN - 1 VIEW COMPARISON:  August 02, 2011 FINDINGS: The bowel gas pattern is normal. A very large amount of stool is seen throughout the colon. Radiopaque surgical clips are seen within the right upper quadrant. No radio-opaque calculi or other  significant radiographic abnormality are seen. IMPRESSION: Very large stool burden without evidence of bowel obstruction. Electronically Signed   By: Aram Candela M.D.   On: 11/25/2020 19:07      Impression / Plan:   Johnny [name] is a 65 y.o. y/o male with grade D esophagitis on upper endoscopy done 2 weeks ago for nausea vomiting, presents with similar symptoms from a group home  Unclear if patient was taking his PPI as prescribed after discharge  His hemoglobin is relatively stable  Repeat upper endoscopy at this time unlikely to change management  Patient is in need of compliance with his PPI to help treat his underlying esophagitis that is causing his symptoms  Would recommend PPI IV twice daily  Keep head of bed elevated to 30 degrees at all times  PPI IV twice daily  Continue serial CBCs and transfuse PRN Avoid NSAIDs Maintain 2 large-bore IV lines Please page GI with any acute hemodynamic changes, or signs of active GI bleeding  If nausea vomiting continues despite above measures, can consider repeat EGD as necessary  Dr. Mia Creek to follow from tomorrow  Thank you for involving me in the care of this patient.      LOS: 0 days   Pasty Spillers, MD  11/26/2020, 5:30 PM

## 2020-11-26 NOTE — Progress Notes (Signed)
Mobility Specialist - Progress Note   11/26/20 1200  Mobility  Range of Motion/Exercises Active  Level of Assistance Independent after set-up  Assistive Device None  Distance Ambulated (ft) 0 ft  Mobility Response Tolerated well  Mobility performed by Mobility specialist  $Mobility charge 1 Mobility    Pt participated in grooming tasks: brushing teeth, washing face, dry bath with set up assist.    Filiberto Pinks Mobility Specialist 11/26/20, 12:04 PM

## 2020-11-26 NOTE — Progress Notes (Signed)
Mobility Specialist - Progress Note   11/26/20 1204  Mobility  Activity Ambulated in hall  Level of Assistance Standby assist, set-up cues, supervision of patient - no hands on  Assistive Device None  Distance Ambulated (ft) 260 ft  Mobility Response Tolerated well  Mobility performed by Mobility specialist  $Mobility charge 1 Mobility    Mobility returned for later session this date. Pt ambulated in hallway without AD. SBA. No LOB noted. No SOB. Max HR of 126 bpm. O2 high 90s RA.    Filiberto Pinks Mobility Specialist 11/26/20, 12:06 PM

## 2020-11-26 NOTE — Progress Notes (Signed)
PT Cancellation Note  Patient Details Name: Johnny [name] MRN: [id number] DOB: [birth year]   Cancelled Treatment:    Reason Eval/Treat Not Completed: PT screened, no needs identified, will sign off (Pt indep with all mobility needs. Amb 250' with supervision. Pt reports he is at his baseline PLOF.)   Delphia Grates. Fairly IV, PT, DPT Physical Therapist- [place]  [facility]  11/26/2020, 4:32 PM

## 2020-11-26 NOTE — Progress Notes (Signed)
Inpatient Diabetes Program Recommendations  AACE/ADA: New Consensus Statement on Inpatient Glycemic Control (2015)  Target Ranges:  Prepandial:   less than 140 mg/dL      Peak postprandial:   less than 180 mg/dL (1-2 hours)      Critically ill patients:  140 - 180 mg/dL   Results for Johnny [name], Johnny [name] (MRN [id number]) as of 11/26/2020 08:38  Ref. Range 11/25/2020 20:37 11/25/2020 23:34 11/26/2020 05:18 11/26/2020 08:17  Glucose-Capillary Latest Ref Range: 70 - 99 mg/dL 488 (H)  3 units NOVOLOG 266 (H)  5 units NOVOLOG  20 units LANTUS 353 (H)  9 units NOVOLOG 61 (L)   Results for Johnny [name], Johnny [name] (MRN [id number]) as of 11/26/2020 07:16  Ref. Range 11/25/2020 16:38  Hemoglobin A1C Latest Ref Range: 4.8 - 5.6 % 7.8 (H)    Admit with: Dehydration/ Esophagitis/ Nausea and Vomiting  History: DM, Schizophrenia  Group Home DM Meds: Lantus 30 units QHS           Invokamet 50/1000 mg BID         Actos 45 mg Daily        Januvia 100 mg Daily        Synjardy 12.01/999 mg BID  Current Orders: Lantus 20 units QHS     Novolog Sensitive Correction Scale/ SSI (0-9 units) Q4 hours     Note Hypoglycemia this AM--Suspect 9 units of Novolog given at 5am for CBG of 353 likely caused the HYPO event.  MD- If patient has any more HYPO events today, may consider reducing the Lantus slightly to 15 units QHS    --Will follow patient during hospitalization--  Ambrose Finland RN, MSN, CDE Diabetes Coordinator Inpatient Glycemic Control Team Team Pager: [phone] (8a-5p)

## 2020-11-26 NOTE — Progress Notes (Signed)
PROGRESS NOTE    Johnny [name]   EHM:[id number]  DOB: 1956-06-17  DOA: 11/25/2020 PCP: Franciso Bend, NP   Brief Narrative:  Johnny [name] is a 65 year old male with esophagitis, schizophrenia, hypertension, diabetes mellitus who was sent from his group home to his PCP due to nausea and vomiting and a blood pressure of 70/50 with tachycardia.  He was sent from the PCP office to the ED. He was recently admitted on 11/11/2020 with nausea vomiting and DKA.  On EGD he was found to have LA grade D reflux esophagitis and gastritis. He was admitted for intractable vomiting.  Subjective: He is hungry and asking for food. No other complaints.    Assessment & Plan:   Principal Problem:    Esophagitis/ Intractable vomiting/  Dehydration/hypotension -Continue twice daily PPI, IV fluids, antiemetics and advance diet as tolerated - Continue to hold benazepril - advance to full liquids today  Active Problems: Severe constipation - may be the cause of the vomiting- enema given- will give another    Schizophrenia, anxiety/ depression and dementia -Continue Seroquel, trazodone, propafenone, clonazepam, benztropine, divalproex, Aricept and Namenda    Uncontrolled type 2 diabetes mellitus (HCC) -Pioglitazone, Invokana/Metformin, sitagliptin, Synjardy on hold - Continue sliding scale insulin and glargine  Dyslipidemia -Continue pravastatin and WelChol  Time spent in minutes: 35 DVT prophylaxis: SCDs Start: 11/25/20 2040  Code Status: stable Family Communication:  Level of Care: Level of care: Progressive Cardiac Disposition Plan:  Status is: Observation  The patient will require care spanning > 2 midnights and should be moved to inpatient because: IV treatments appropriate due to intensity of illness or inability to take PO  Dispo: The patient is from: Home              Anticipated d/c is to: Home              Patient currently is not medically stable to d/c.   Difficult to  place patient No      Consultants:   none Procedures:   none Antimicrobials:  Anti-infectives (From admission, onward)   None       Objective: Vitals:   11/25/20 2030 11/26/20 0016 11/26/20 0814 11/26/20 0815  BP: 126/64   103/67  Pulse: (!) 101 100  99  Resp: 18 16  17   Temp: 98.8 F (37.1 C)  98.1 F (36.7 C)   TempSrc: Oral  Oral   SpO2: 98% 97%  97%  Weight: 69 kg     Height: 5\' 9"  (1.753 m)       Intake/Output Summary (Last 24 hours) at 11/26/2020 1028 Last data filed at 11/26/2020 0945 Gross per 24 hour  Intake 1200 ml  Output 1350 ml  Net -150 ml   Filed Weights   11/25/20 2030  Weight: 69 kg    Examination: General exam: Appears comfortable  HEENT: PERRLA, oral mucosa moist, no sclera icterus or thrush Respiratory system: Clear to auscultation. Respiratory effort normal. Cardiovascular system: S1 & S2 heard, RRR.   Gastrointestinal system: Abdomen soft, non-tender, nondistended. Normal bowel sounds. Central nervous system: Alert and oriented. No focal neurological deficits. Extremities: No cyanosis, clubbing or edema Skin: No rashes or ulcers Psychiatry:  Mood & affect appropriate.     Data Reviewed: I have personally reviewed following labs and imaging studies  CBC: Recent Labs  Lab 11/25/20 1630 11/26/20 0526  WBC 11.0* 11.2*  NEUTROABS 7.5 6.9  HGB 14.4 12.3*  HCT 42.4 37.3*  MCV 87.6  88.6  PLT 284 230   Basic Metabolic Panel: Recent Labs  Lab 11/25/20 1630 11/25/20 1638 11/26/20 0526  NA 133*  --  134*  K 4.5  --  4.7  CL 95*  --  99  CO2 27  --  29  GLUCOSE 229*  --  135*  BUN 31*  --  20  CREATININE 1.17  --  0.85  CALCIUM 9.4  --  9.0  MG 1.9  --  2.1  PHOS  --  3.3 2.3*   GFR: Estimated Creatinine Clearance: 87.9 mL/min (by C-G formula based on SCr of 0.85 mg/dL). Liver Function Tests: Recent Labs  Lab 11/25/20 1630 11/26/20 0526  AST 11* 11*  ALT 10 9  ALKPHOS 71 58  BILITOT 0.9 0.6  PROT 6.6 5.6*   ALBUMIN 3.4* 3.0*   Recent Labs  Lab 11/25/20 1630  LIPASE 33   No results for input(s): AMMONIA in the last 168 hours. Coagulation Profile: No results for input(s): INR, PROTIME in the last 168 hours. Cardiac Enzymes: Recent Labs  Lab 11/25/20 1638  CKTOTAL 35*   BNP (last 3 results) No results for input(s): PROBNP in the last 8760 hours. HbA1C: Recent Labs    11/25/20 1638  HGBA1C 7.8*   CBG: Recent Labs  Lab 11/25/20 2037 11/25/20 2334 11/26/20 0518 11/26/20 0817 11/26/20 0845  GLUCAP 211* 266* 353* 61* 101*   Lipid Profile: No results for input(s): CHOL, HDL, LDLCALC, TRIG, CHOLHDL, LDLDIRECT in the last 72 hours. Thyroid Function Tests: Recent Labs    11/26/20 0526  TSH 2.462   Anemia Panel: No results for input(s): VITAMINB12, FOLATE, FERRITIN, TIBC, IRON, RETICCTPCT in the last 72 hours. Urine analysis:    Component Value Date/Time   COLORURINE YELLOW (A) 11/11/2020 1900   APPEARANCEUR CLEAR (A) 11/11/2020 1900   LABSPEC 1.028 11/11/2020 1900   PHURINE 5.0 11/11/2020 1900   GLUCOSEU >=500 (A) 11/11/2020 1900   HGBUR NEGATIVE 11/11/2020 1900   HGBUR negative 06/10/2008 0920   BILIRUBINUR NEGATIVE 11/11/2020 1900   KETONESUR 20 (A) 11/11/2020 1900   PROTEINUR NEGATIVE 11/11/2020 1900   UROBILINOGEN 1.0 08/04/2008 1322   NITRITE NEGATIVE 11/11/2020 1900   LEUKOCYTESUR NEGATIVE 11/11/2020 1900   Sepsis Labs: @LABRCNTIP (procalcitonin:4,lacticidven:4) ) Recent Results (from the past 240 hour(s))  Resp Panel by RT-PCR (Flu A&B, Covid) Nasopharyngeal Swab     Status: None   Collection Time: 11/25/20  5:03 PM   Specimen: Nasopharyngeal Swab; Nasopharyngeal(NP) swabs in vial transport medium  Result Value Ref Range Status   SARS Coronavirus 2 by RT PCR NEGATIVE NEGATIVE Final    Comment: (NOTE) SARS-CoV-2 target nucleic acids are NOT DETECTED.  The SARS-CoV-2 RNA is generally detectable in upper respiratory specimens during the acute phase of  infection. The lowest concentration of SARS-CoV-2 viral copies this assay can detect is 138 copies/mL. A negative result does not preclude SARS-Cov-2 infection and should not be used as the sole basis for treatment or other patient management decisions. A negative result may occur with  improper specimen collection/handling, submission of specimen other than nasopharyngeal swab, presence of viral mutation(s) within the areas targeted by this assay, and inadequate number of viral copies(<138 copies/mL). A negative result must be combined with clinical observations, patient history, and epidemiological information. The expected result is Negative.  Fact Sheet for Patients:  BloggerCourse.comhttps://www.fda.gov/media/152166/download  Fact Sheet for Healthcare Providers:  SeriousBroker.ithttps://www.fda.gov/media/152162/download  This test is no t yet approved or cleared by the [place] FDA and  has been authorized for detection and/or diagnosis of SARS-CoV-2 by FDA under an Emergency Use Authorization (EUA). This EUA will remain  in effect (meaning this test can be used) for the duration of the COVID-19 declaration under Section 564(b)(1) of the Act, 21 U.S.C.section 360bbb-3(b)(1), unless the authorization is terminated  or revoked sooner.       Influenza A by PCR NEGATIVE NEGATIVE Final   Influenza B by PCR NEGATIVE NEGATIVE Final    Comment: (NOTE) The Xpert Xpress SARS-CoV-2/FLU/RSV plus assay is intended as an aid in the diagnosis of influenza from Nasopharyngeal swab specimens and should not be used as a sole basis for treatment. Nasal washings and aspirates are unacceptable for Xpert Xpress SARS-CoV-2/FLU/RSV testing.  Fact Sheet for Patients: BloggerCourse.com  Fact Sheet for Healthcare Providers: SeriousBroker.it  This test is not yet approved or cleared by the [place] FDA and has been authorized for detection and/or diagnosis of SARS-CoV-2  by FDA under an Emergency Use Authorization (EUA). This EUA will remain in effect (meaning this test can be used) for the duration of the COVID-19 declaration under Section 564(b)(1) of the Act, 21 U.S.C. section 360bbb-3(b)(1), unless the authorization is terminated or revoked.  Performed at [facility], [address] [place], [place] [address]   MRSA PCR Screening     Status: None   Collection Time: 11/26/20 12:35 AM   Specimen: Nasal Mucosa; Nasopharyngeal  Result Value Ref Range Status   MRSA by PCR NEGATIVE NEGATIVE Final    Comment:        The GeneXpert MRSA Assay (FDA approved for NASAL specimens only), is one component of a comprehensive MRSA colonization surveillance program. It is not intended to diagnose MRSA infection nor to guide or monitor treatment for MRSA infections. Performed at [facility], [address], [place], [place] [address]          Radiology Studies: DG Chest 2 View  Result Date: 11/25/2020 CLINICAL DATA:  Vomiting. EXAM: CHEST - 2 VIEW COMPARISON:  November 10, 2020 FINDINGS: The lungs are mildly hyperinflated. Stable linear scarring is seen within the mid right lung and lateral aspect of the right lung base. Mild to moderate severity atelectasis and/or infiltrate is noted within the posterior aspect of the right lower lobe. A small right pleural effusion is seen. No pneumothorax is identified. The heart size and mediastinal contours are within normal limits. There is moderate severity calcification of the aortic arch. Radiopaque surgical clips are seen within the right upper quadrant. The visualized skeletal structures are unremarkable. IMPRESSION: 1. Mild to moderate severity right lower lobe scarring and/or atelectasis with small right pleural effusion. Electronically Signed   By: Aram Candela M.D.   On: 11/25/2020 17:30   DG Abdomen 1 View  Result Date: 11/25/2020 CLINICAL DATA:  Not tolerating oral liquids. EXAM:  ABDOMEN - 1 VIEW COMPARISON:  August 02, 2011 FINDINGS: The bowel gas pattern is normal. A very large amount of stool is seen throughout the colon. Radiopaque surgical clips are seen within the right upper quadrant. No radio-opaque calculi or other significant radiographic abnormality are seen. IMPRESSION: Very large stool burden without evidence of bowel obstruction. Electronically Signed   By: Aram Candela M.D.   On: 11/25/2020 19:07      Scheduled Meds: . benztropine  1 mg Oral BID  . buPROPion  300 mg Oral Daily  . clonazePAM  1 mg Oral TID  . colesevelam  1,875 mg Oral BID WC  .  divalproex  500 mg Oral BID  . donepezil  10 mg Oral QHS   And  . memantine  28 mg Oral QHS  . insulin aspart  0-9 Units Subcutaneous Q4H  . insulin glargine  20 Units Subcutaneous QHS  . mometasone-formoterol  2 puff Inhalation BID  . pantoprazole (PROTONIX) IV  40 mg Intravenous Q12H  . pravastatin  40 mg Oral QHS  . QUEtiapine  600 mg Oral QHS  . thiamine  100 mg Intravenous Once  . tiotropium  18 mcg Inhalation Daily  . traZODone  200 mg Oral QHS   Continuous Infusions: . sodium chloride 100 mL/hr at 11/26/20 0722  . lactated ringers       LOS: 0 days      Calvert Cantor, MD [facility] Pager: [email or web address] 11/26/2020, 10:28 AM

## 2020-11-26 NOTE — Evaluation (Signed)
Occupational Therapy Evaluation Patient Details Name: Johnny [name] MRN: [id number] DOB: July 29, 1956 Today's Date: 11/26/2020    History of Present Illness 65 y.o. male with h/o HTN, HLD, schizophrenia, lives in a group home, DM and GERD who is admitted with esophagitis, nausea and vomiting   Clinical Impression   Upon entering the room, pt supine in bed with no c/o pain. Pt perseverating on asking for food throughout session need max multimodal cuing to redirect. Staff reporting pt transferring and ambulating in room with supervision. Pt observed to be at supervision level during this session as well. Pt with some paranoid responses during session but overall agreeable. Pt with no skilled OT needs at this time. OT to SIGN OFF at this time. Thank you for this referral.     Follow Up Recommendations  No OT follow up;Supervision/Assistance - 24 hour    Equipment Recommendations  None recommended by OT       Precautions / Restrictions Precautions Precautions: Fall      Mobility Bed Mobility Overal bed mobility: Modified Independent             General bed mobility comments: no physical assistance needed    Transfers Overall transfer level: Needs assistance Equipment used: None Transfers: Sit to/from [employer] Transfers Sit to Stand: Supervision Stand pivot transfers:  (per staff report)       General transfer comment: supervision for safety        ADL either performed or assessed with clinical judgement   ADL Overall ADL's : At baseline                                             Vision Baseline Vision/History: No visual deficits Patient Visual Report: No change from baseline              Pertinent Vitals/Pain Pain Assessment: No/denies pain     Hand Dominance Right   Extremity/Trunk Assessment Upper Extremity Assessment Upper Extremity Assessment: Overall WFL for tasks assessed   Lower Extremity Assessment Lower  Extremity Assessment: Overall WFL for tasks assessed       Communication Communication Communication: No difficulties   Cognition Arousal/Alertness: Awake/alert Behavior During Therapy: WFL for tasks assessed/performed Overall Cognitive Status: History of cognitive impairments - at baseline                                 General Comments: max multimodal cuing for redirection as pt perseverates on asking therapist for food. Pt is paranoid and does not want to tell therapist his name and then report the one he has was given to him by the government.              Home Living Family/patient expects to be discharged to:: Group home                                 Additional Comments: per chart , group home has ramp      Prior Functioning/Environment Level of Independence: Independent        Comments: per pt report                OT Goals(Current goals can be found in the care plan section) Acute Rehab OT Goals  Patient Stated Goal: none stated OT Goal Formulation: Patient unable to participate in goal setting Potential to Achieve Goals: Fair   AM-PAC OT "6 Clicks" Daily Activity     Outcome Measure Help from another person eating meals?: None Help from another person taking care of personal grooming?: None Help from another person toileting, which includes using toliet, bedpan, or urinal?: None Help from another person bathing (including washing, rinsing, drying)?: None Help from another person to put on and taking off regular upper body clothing?: None Help from another person to put on and taking off regular lower body clothing?: None 6 Click Score: 24   End of Session Nurse Communication: Mobility status  Activity Tolerance: Patient tolerated treatment well Patient left: in bed;with call bell/phone within reach;with bed alarm set                   Time: 1343-1402 OT Time Calculation (min): 19 min Charges:  OT General Charges $OT  Visit: 1 Visit OT Evaluation $OT Eval Low Complexity: 1 Low  Jackquline Denmark, MS, OTR/L , CBIS ascom [phone]  11/26/20, 4:27 PM

## 2020-11-26 NOTE — Progress Notes (Signed)
Initial Nutrition Assessment  DOCUMENTATION CODES:   Non-severe (moderate) malnutrition in context of social or environmental circumstances  INTERVENTION:   Ensure Max protein supplement BID, each supplement provides 150kcal and 30g of protein.  MVI po daily   Dysphagia 3 diet with advancement   Pt at high refeed risk; recommend monitor potassium, magnesium and phosphorus labs daily until stable  NUTRITION DIAGNOSIS:   Moderate Malnutrition related to social / environmental circumstances as evidenced by moderate fat depletion,moderate muscle depletion,severe muscle depletion.  GOAL:   Patient will meet greater than or equal to 90% of their needs  MONITOR:   PO intake,Supplement acceptance,Labs,Weight trends,Skin,I & O's  REASON FOR ASSESSMENT:   Consult Assessment of nutrition requirement/status  ASSESSMENT:   65 y.o. male with h/o HTN, HLD, schizophrenia, lives in a group home, DM and GERD who is admitted with esophagitis, nausea and vomiting   Met with pt in room today. Pt reports good appetite and oral intake at baseline but reports decreased oral intake pta r/t nausea and vomiting. Pt reports that after he would eat, he would feel a burning sensation and then he would vomit. Pt reports that this is better in the hospital. Pt currently eating 100% of his full liquid diet in hospital. Pt reports that he does not drink supplements at home but he is willing to drink strawberry Ensure in hospital. Pt also requesting mechanical soft diet r/t poor dentition. RD will add supplements and MVI to help pt meet his estimated needs. Pt is at high refeed risk. Pt reports a recent 60lb weight loss but then reports that his UBW is ~160lbs. Per chart, pt appears to be around 160lbs over the past year; pt is currently 8lbs(5%) below is his UBW.   Medications reviewed and include: insulin, protonix, B1, NaCl @100ml /hr  Labs reviewed: Na 134(L), P 2.3(L), Mg 2.1 wnl Wbc- 11.2(H) cbgs- 353,  61, 101, 177 x 24 hrs AIC 7.8(H)- 3/17  NUTRITION - FOCUSED PHYSICAL EXAM:  Flowsheet Row Most Recent Value  Orbital Region Moderate depletion  Upper Arm Region Moderate depletion  Thoracic and Lumbar Region Moderate depletion  Buccal Region Moderate depletion  Temple Region Moderate depletion  Clavicle Bone Region Severe depletion  Clavicle and Acromion Bone Region Severe depletion  Scapular Bone Region Moderate depletion  Dorsal Hand Moderate depletion  Patellar Region Severe depletion  Anterior Thigh Region Severe depletion  Posterior Calf Region Severe depletion  Edema (RD Assessment) None  Hair Reviewed  Eyes Reviewed  Mouth Reviewed  Skin Reviewed  Nails Reviewed     Diet Order:   Diet Order            Diet full liquid Room service appropriate? Yes; Fluid consistency: Thin  Diet effective now                EDUCATION NEEDS:   Education needs have been addressed  Skin:  Skin Assessment: Reviewed RN Assessment  Last BM:  3/18- type 5  Height:   Ht Readings from Last 1 Encounters:  11/25/20 5' 9"  (1.753 m)    Weight:   Wt Readings from Last 1 Encounters:  11/25/20 69 kg    Ideal Body Weight:  72.7 kg  BMI:  Body mass index is 22.46 kg/m.  Estimated Nutritional Needs:   Kcal:  1900-2200kcal/day  Protein:  95-110g/day  Fluid:  2.1-2.4L/day  Koleen Distance MS, RD, LDN Please refer to [facility] for RD and/or RD on-call/weekend/after hours pager

## 2020-11-27 ENCOUNTER — Inpatient Hospital Stay: Payer: Medicare Other

## 2020-11-27 LAB — GLUCOSE, CAPILLARY
Glucose-Capillary: 204 mg/dL — ABNORMAL HIGH (ref 70–99)
Glucose-Capillary: 221 mg/dL — ABNORMAL HIGH (ref 70–99)
Glucose-Capillary: 240 mg/dL — ABNORMAL HIGH (ref 70–99)
Glucose-Capillary: 246 mg/dL — ABNORMAL HIGH (ref 70–99)
Glucose-Capillary: 297 mg/dL — ABNORMAL HIGH (ref 70–99)
Glucose-Capillary: 299 mg/dL — ABNORMAL HIGH (ref 70–99)

## 2020-11-27 MED ORDER — MAGNESIUM CITRATE PO SOLN
1.0000 | Freq: Four times a day (QID) | ORAL | Status: AC
  Administered 2020-11-27 (×2): 1 via ORAL
  Filled 2020-11-27 (×2): qty 296

## 2020-11-27 MED ORDER — LINAGLIPTIN 5 MG PO TABS
5.0000 mg | ORAL_TABLET | Freq: Every day | ORAL | Status: DC
  Administered 2020-11-27 – 2020-11-28 (×2): 5 mg via ORAL
  Filled 2020-11-27 (×2): qty 1

## 2020-11-27 MED ORDER — INSULIN ASPART 100 UNIT/ML ~~LOC~~ SOLN
0.0000 [IU] | Freq: Three times a day (TID) | SUBCUTANEOUS | Status: DC
  Administered 2020-11-27: 5 [IU] via SUBCUTANEOUS
  Administered 2020-11-27 – 2020-11-28 (×3): 3 [IU] via SUBCUTANEOUS
  Administered 2020-11-28 (×2): 5 [IU] via SUBCUTANEOUS
  Filled 2020-11-27 (×5): qty 1

## 2020-11-27 MED ORDER — INSULIN GLARGINE 100 UNIT/ML ~~LOC~~ SOLN
30.0000 [IU] | Freq: Every day | SUBCUTANEOUS | Status: DC
  Administered 2020-11-27: 30 [IU] via SUBCUTANEOUS
  Filled 2020-11-27 (×2): qty 0.3

## 2020-11-27 NOTE — Progress Notes (Signed)
PROGRESS NOTE    Johnny [name]   GXQ:[id number]  DOB: Sep 27, 1955  DOA: 11/25/2020 PCP: Franciso Bend, NP   Brief Narrative:  Johnny [name] is a 64 year old male who lives in a group home with esophagitis, schizophrenia, hypertension, diabetes mellitus who was sent from his group home to his PCP due to nausea and vomiting and a blood pressure of 70/50 with tachycardia.  He was sent from the PCP office to the ED.   Seen in ED on 3/14 for dizziness.  He was recently admitted on 3/3 and discharged on 3/5 with nausea vomiting and DKA.  On EGD he was found to have LA grade D reflux esophagitis and gastritis.  He has been admitted for intractable vomiting.  Subjective: He is asking for cheese and crackers.   Assessment & Plan:   Principal Problem:    Esophagitis/ Intractable vomiting causing Dehydration/hypotension -Continue twice daily PPI, IV fluids, antiemetics and advance diet as tolerated - Continue to hold benazepril - BP improving - vomiting has resolved and he is eating solid food- he has been constantly asking for food - stop IVF today   Active Problems: Severe constipation - this likely was the cause of the vomiting-   - Linzess is listed on med rec which can cause constipation - despite having multiple BMs after 2 enemas yesterday, his abd is still distended today and his xray shows a large amount of stool- will order enema every 6 hrs x 3 and mag citrate every 6 hrs x 3 and reassess tomorrow    Schizophrenia, anxiety/ depression and dementia -Continue Seroquel, trazodone, propafenone, clonazepam, benztropine, divalproex, Aricept and Namenda - he receives [place] every month    Uncontrolled type 2 diabetes mellitus (HCC) -Pioglitazone, Invokana/Metformin, sitagliptin, Synjardy on hold - Continue sliding scale insulin and Glargine- increase dose today as he is tolerating solids and sugars are rising - resume Januvia today    Component Value Date/Time    HGBA1C 7.8 (H) 11/25/2020 1638    Dyslipidemia -Continue pravastatin and WelChol  Time spent in minutes: 35 DVT prophylaxis: SCDs Start: 11/25/20 2040  Code Status: stable Family Communication:  Level of Care: Level of care: Med-Surg Disposition Plan:  Status is: Inpatient    Dispo: The patient is from: Home              Anticipated d/c is to: Home              Patient currently is not medically stable to d/c.   Difficult to place patient No   Consultants:   none Procedures:   none Antimicrobials:  Anti-infectives (From admission, onward)   None       Objective: Vitals:   11/26/20 2221 11/27/20 0420 11/27/20 0736 11/27/20 1219  BP:  122/77 120/62 130/80  Pulse: 99 99 99 95  Resp:  18 18 16   Temp:  98.8 F (37.1 C) 98.6 F (37 C) 98.7 F (37.1 C)  TempSrc:   Oral Oral  SpO2: 98% 97% 97% 98%  Weight:      Height:        Intake/Output Summary (Last 24 hours) at 11/27/2020 1240 Last data filed at 11/27/2020 1219 Gross per 24 hour  Intake 3703.53 ml  Output 3125 ml  Net 578.53 ml   Filed Weights   11/25/20 2030  Weight: 69 kg    Examination: General exam: Appears comfortable  HEENT: PERRLA, oral mucosa moist, no sclera icterus or thrush Respiratory system: Clear to auscultation.  Respiratory effort normal. Cardiovascular system: S1 & S2 heard, regular rate and rhythm Gastrointestinal system: Abdomen soft, non-tender, nondistended. Normal bowel sounds   Central nervous system: Alert and oriented. No focal neurological deficits. Extremities: No cyanosis, clubbing or edema Skin: No rashes or ulcers Psychiatry:  Mood & affect appropriate.     Data Reviewed: I have personally reviewed following labs and imaging studies  CBC: Recent Labs  Lab 11/25/20 1630 11/26/20 0526  WBC 11.0* 11.2*  NEUTROABS 7.5 6.9  HGB 14.4 12.3*  HCT 42.4 37.3*  MCV 87.6 88.6  PLT 284 230   Basic Metabolic Panel: Recent Labs  Lab 11/25/20 1630 11/25/20 1638  11/26/20 0526  NA 133*  --  134*  K 4.5  --  4.7  CL 95*  --  99  CO2 27  --  29  GLUCOSE 229*  --  135*  BUN 31*  --  20  CREATININE 1.17  --  0.85  CALCIUM 9.4  --  9.0  MG 1.9  --  2.1  PHOS  --  3.3 2.3*   GFR: Estimated Creatinine Clearance: 87.9 mL/min (by C-G formula based on SCr of 0.85 mg/dL). Liver Function Tests: Recent Labs  Lab 11/25/20 1630 11/26/20 0526  AST 11* 11*  ALT 10 9  ALKPHOS 71 58  BILITOT 0.9 0.6  PROT 6.6 5.6*  ALBUMIN 3.4* 3.0*   Recent Labs  Lab 11/25/20 1630  LIPASE 33   No results for input(s): AMMONIA in the last 168 hours. Coagulation Profile: No results for input(s): INR, PROTIME in the last 168 hours. Cardiac Enzymes: Recent Labs  Lab 11/25/20 1638  CKTOTAL 35*   BNP (last 3 results) No results for input(s): PROBNP in the last 8760 hours. HbA1C: Recent Labs    11/25/20 1638  HGBA1C 7.8*   CBG: Recent Labs  Lab 11/26/20 2041 11/27/20 0034 11/27/20 0422 11/27/20 0752 11/27/20 1147  GLUCAP 327* 204* 221* 240* 246*   Lipid Profile: No results for input(s): CHOL, HDL, LDLCALC, TRIG, CHOLHDL, LDLDIRECT in the last 72 hours. Thyroid Function Tests: Recent Labs    11/26/20 0526  TSH 2.462   Anemia Panel: No results for input(s): VITAMINB12, FOLATE, FERRITIN, TIBC, IRON, RETICCTPCT in the last 72 hours. Urine analysis:    Component Value Date/Time   COLORURINE YELLOW (A) 11/11/2020 1900   APPEARANCEUR CLEAR (A) 11/11/2020 1900   LABSPEC 1.028 11/11/2020 1900   PHURINE 5.0 11/11/2020 1900   GLUCOSEU >=500 (A) 11/11/2020 1900   HGBUR NEGATIVE 11/11/2020 1900   HGBUR negative 06/10/2008 0920   BILIRUBINUR NEGATIVE 11/11/2020 1900   KETONESUR 20 (A) 11/11/2020 1900   PROTEINUR NEGATIVE 11/11/2020 1900   UROBILINOGEN 1.0 08/04/2008 1322   NITRITE NEGATIVE 11/11/2020 1900   LEUKOCYTESUR NEGATIVE 11/11/2020 1900   Sepsis Labs: @LABRCNTIP (procalcitonin:4,lacticidven:4) ) Recent Results (from the past 240  hour(s))  Resp Panel by RT-PCR (Flu A&B, Covid) Nasopharyngeal Swab     Status: None   Collection Time: 11/25/20  5:03 PM   Specimen: Nasopharyngeal Swab; Nasopharyngeal(NP) swabs in vial transport medium  Result Value Ref Range Status   SARS Coronavirus 2 by RT PCR NEGATIVE NEGATIVE Final    Comment: (NOTE) SARS-CoV-2 target nucleic acids are NOT DETECTED.  The SARS-CoV-2 RNA is generally detectable in upper respiratory specimens during the acute phase of infection. The lowest concentration of SARS-CoV-2 viral copies this assay can detect is 138 copies/mL. A negative result does not preclude SARS-Cov-2 infection and should not be used as  the sole basis for treatment or other patient management decisions. A negative result may occur with  improper specimen collection/handling, submission of specimen other than nasopharyngeal swab, presence of viral mutation(s) within the areas targeted by this assay, and inadequate number of viral copies(<138 copies/mL). A negative result must be combined with clinical observations, patient history, and epidemiological information. The expected result is Negative.  Fact Sheet for Patients:  BloggerCourse.comhttps://www.fda.gov/media/152166/download  Fact Sheet for Healthcare Providers:  SeriousBroker.ithttps://www.fda.gov/media/152162/download  This test is no t yet approved or cleared by the [place] FDA and  has been authorized for detection and/or diagnosis of SARS-CoV-2 by FDA under an Emergency Use Authorization (EUA). This EUA will remain  in effect (meaning this test can be used) for the duration of the COVID-19 declaration under Section 564(b)(1) of the Act, 21 U.S.C.section 360bbb-3(b)(1), unless the authorization is terminated  or revoked sooner.       Influenza A by PCR NEGATIVE NEGATIVE Final   Influenza B by PCR NEGATIVE NEGATIVE Final    Comment: (NOTE) The Xpert Xpress SARS-CoV-2/FLU/RSV plus assay is intended as an aid in the diagnosis of influenza from  Nasopharyngeal swab specimens and should not be used as a sole basis for treatment. Nasal washings and aspirates are unacceptable for Xpert Xpress SARS-CoV-2/FLU/RSV testing.  Fact Sheet for Patients: BloggerCourse.comhttps://www.fda.gov/media/152166/download  Fact Sheet for Healthcare Providers: SeriousBroker.ithttps://www.fda.gov/media/152162/download  This test is not yet approved or cleared by the [place] FDA and has been authorized for detection and/or diagnosis of SARS-CoV-2 by FDA under an Emergency Use Authorization (EUA). This EUA will remain in effect (meaning this test can be used) for the duration of the COVID-19 declaration under Section 564(b)(1) of the Act, 21 U.S.C. section 360bbb-3(b)(1), unless the authorization is terminated or revoked.  Performed at [facility], [address] [place], [place] [address]   MRSA PCR Screening     Status: None   Collection Time: 11/26/20 12:35 AM   Specimen: Nasal Mucosa; Nasopharyngeal  Result Value Ref Range Status   MRSA by PCR NEGATIVE NEGATIVE Final    Comment:        The GeneXpert MRSA Assay (FDA approved for NASAL specimens only), is one component of a comprehensive MRSA colonization surveillance program. It is not intended to diagnose MRSA infection nor to guide or monitor treatment for MRSA infections. Performed at [facility], [address] [place], [place] [address]          Radiology Studies: DG Chest 2 View  Result Date: 11/25/2020 CLINICAL DATA:  Vomiting. EXAM: CHEST - 2 VIEW COMPARISON:  November 10, 2020 FINDINGS: The lungs are mildly hyperinflated. Stable linear scarring is seen within the mid right lung and lateral aspect of the right lung base. Mild to moderate severity atelectasis and/or infiltrate is noted within the posterior aspect of the right lower lobe. A small right pleural effusion is seen. No pneumothorax is identified. The heart size and mediastinal contours are within normal limits. There is  moderate severity calcification of the aortic arch. Radiopaque surgical clips are seen within the right upper quadrant. The visualized skeletal structures are unremarkable. IMPRESSION: 1. Mild to moderate severity right lower lobe scarring and/or atelectasis with small right pleural effusion. Electronically Signed   By: Aram Candelahaddeus  Houston M.D.   On: 11/25/2020 17:30   DG Abdomen 1 View  Result Date: 11/25/2020 CLINICAL DATA:  Not tolerating oral liquids. EXAM: ABDOMEN - 1 VIEW COMPARISON:  August 02, 2011 FINDINGS: The bowel gas pattern is normal.  A very large amount of stool is seen throughout the colon. Radiopaque surgical clips are seen within the right upper quadrant. No radio-opaque calculi or other significant radiographic abnormality are seen. IMPRESSION: Very large stool burden without evidence of bowel obstruction. Electronically Signed   By: Aram Candela M.D.   On: 11/25/2020 19:07   DG Abd Portable 2V  Result Date: 11/27/2020 CLINICAL DATA:  Abdominal pain and distension EXAM: PORTABLE ABDOMEN - 2 VIEW COMPARISON:  Multiple exams, including 11/25/2020 FINDINGS: Chronic bandlike and rounded densities at the right lung base similar to 11/25/2020. Much of this may be from scarring. Mildly blunted right lateral costophrenic angle. Marked prominence of stool throughout the colon and rectum, similar to prior. No obvious dilated small bowel. No appreciable air-fluid levels. IMPRESSION: 1. Marked prominence of stool throughout the colon and rectum, similar to prior. Correlate with fecal impaction/constipation. 2. Chronic bandlike and rounded densities at the right lung base, favoring scarring. Suspected trace right pleural effusion. Electronically Signed   By: Gaylyn Rong M.D.   On: 11/27/2020 11:32      Scheduled Meds: . benztropine  1 mg Oral BID  . buPROPion  300 mg Oral Daily  . clonazePAM  1 mg Oral TID  . colesevelam  1,875 mg Oral BID WC  . divalproex  500 mg Oral BID  .  donepezil  10 mg Oral QHS   And  . memantine  28 mg Oral QHS  . insulin aspart  0-9 Units Subcutaneous TID WC  . insulin glargine  20 Units Subcutaneous QHS  . magnesium citrate  1 Bottle Oral Q6H  . mometasone-formoterol  2 puff Inhalation BID  . multivitamin with minerals  1 tablet Oral Daily  . pantoprazole (PROTONIX) IV  40 mg Intravenous Q12H  . pravastatin  40 mg Oral QHS  . Ensure Max Protein  11 oz Oral BID  . QUEtiapine  600 mg Oral QHS  . tiotropium  18 mcg Inhalation Daily  . traZODone  200 mg Oral QHS   Continuous Infusions: . sodium chloride 100 mL/hr at 11/27/20 0751     LOS: 1 day      Calvert Cantor, MD [facility] Pager: [email or web address] 11/27/2020, 12:40 PM

## 2020-11-27 NOTE — Progress Notes (Signed)
Mobility Specialist - Progress Note   11/27/20 1500  Mobility  Activity Ambulated in hall  Level of Assistance Standby assist, set-up cues, supervision of patient - no hands on  Assistive Device None  Distance Ambulated (ft) 350 ft  Mobility Response Tolerated well  Mobility performed by Mobility specialist  $Mobility charge 1 Mobility    Pt ambulated in hallway without AD. No LOB. No SOB. 121 HR, 96% O2 RA. SBA.   Filiberto Pinks Mobility Specialist 11/27/20, 3:37 PM

## 2020-11-28 DIAGNOSIS — E860 Dehydration: Secondary | ICD-10-CM

## 2020-11-28 DIAGNOSIS — I9589 Other hypotension: Secondary | ICD-10-CM

## 2020-11-28 DIAGNOSIS — E861 Hypovolemia: Secondary | ICD-10-CM

## 2020-11-28 DIAGNOSIS — K208 Other esophagitis without bleeding: Secondary | ICD-10-CM

## 2020-11-28 DIAGNOSIS — E440 Moderate protein-calorie malnutrition: Secondary | ICD-10-CM

## 2020-11-28 DIAGNOSIS — K5901 Slow transit constipation: Principal | ICD-10-CM

## 2020-11-28 LAB — GLUCOSE, CAPILLARY
Glucose-Capillary: 221 mg/dL — ABNORMAL HIGH (ref 70–99)
Glucose-Capillary: 279 mg/dL — ABNORMAL HIGH (ref 70–99)
Glucose-Capillary: 259 mg/dL — ABNORMAL HIGH (ref 70–99)

## 2020-11-28 MED ORDER — BISACODYL 10 MG RE SUPP
10.0000 mg | Freq: Every day | RECTAL | 0 refills | Status: AC | PRN
Start: 2020-11-28 — End: ?

## 2020-11-28 MED ORDER — SENNA 8.6 MG PO TABS: 2.0000 | ORAL_TABLET | Freq: Every evening | ORAL | 0 refills | Status: AC | PRN

## 2020-11-28 MED ORDER — FLEET ENEMA 7-19 GM/118ML RE ENEM: 1.0000 | ENEMA | Freq: Every day | RECTAL | 3 refills | Status: AC | PRN

## 2020-11-28 MED ORDER — POLYETHYLENE GLYCOL 3350 17 G PO PACK: 17.0000 g | PACK | Freq: Two times a day (BID) | ORAL | 0 refills | Status: AC

## 2020-11-28 NOTE — TOC Transition Note (Signed)
Transition of Care [facility]) - CM/SW Discharge Note   Patient Details  Name: Johnny [name] MRN: [id number] Date of Birth: Jan 31, 1956  [facility] CM/SW Contact:  Bing Quarry, RN Phone Number: 11/28/2020, 2:35 PM   Clinical Narrative: Patient to be discharge to group home at [facility]. Spoke with Merceda Elks, care giver for 14 years,  [phone] and he accepted patient and will provide transportation from [facility] to group home. New FL2 created and signed by provider. Printed to Unit RN via [employer] including copy of DC Summary, and Facesheet. VM left with legal guardian listed in contacts, Meredith Staggers, [phone]. Medications will be filled Monday at [facility] per [name]. Only thing needed tonight after discharge is laxative which patient was receiving at home per [name]. Patient has been with caretaker, [name]. For 14 years and he was well versed in patient history and condition. Updated him on discharge but Unit RN will contact him for transport time and give FL2 in AVS packet. Gabriel Cirri RN CM    Final next level of care: Group Home Barriers to Discharge: Barriers Resolved   Patient Goals and CMS Choice        Discharge Placement                Patient to be transferred to facility by: Merceda Elks [phone] Name of family member notified: VM left with legal guardian Meredith Staggers at [phone] Patient and family notified of of transfer: 11/28/20 (Via VM to lega Guardian and contact with Merceda Elks at group home.)  Discharge Plan and Services                DME Arranged: N/A DME Agency: NA       HH Arranged: NA HH Agency: NA        Social Determinants of Health (SDOH) Interventions     Readmission Risk Interventions Readmission Risk Prevention Plan 06/19/2020  PCP or Specialist Appt within 3-5 Days Complete  HRI or Home Care Consult Complete  Palliative Care Screening Not Applicable  Medication  Review (RN Care Manager) Complete  Some recent data might be hidden

## 2020-11-28 NOTE — Discharge Summary (Signed)
Physician Discharge Summary  CAMBRIDGE DELEO PIR:[id number] DOB: 1955-10-13 DOA: 11/25/2020  PCP: Franciso Bend, NP  Admit date: 11/25/2020 Discharge date: 11/28/2020  Admitted From: group home Disposition:  Group home   Recommendations for Outpatient Follow-up:  1. F/u on constipation and hunger issues  Home Health:  none  Discharge Condition:  stable   CODE STATUS:  Full code   Consultations:  none  Procedures/Studies: . none   Discharge Diagnoses:  Principal Problem:   Intractable vomiting with nausea Active Problems:   Dehydration   Esophagitis, [place] grade D   Malnutrition of moderate degree   Constipation due to slow transit   Hypotension due to hypovolemia   Dehydration, severe   Type 2 diabetes mellitus with hyperlipidemia (HCC)   Schizophrenia (HCC)   Essential hypertension   GERD   Uncontrolled type 2 diabetes mellitus (HCC)     Brief Summary: Johnny [name] is a 65 year old male who lives in a group home with esophagitis, schizophrenia, hypertension, diabetes mellitus who was sent from his group home to his PCP due to nausea and vomiting and a blood pressure of 70/50 with tachycardia.  He was sent from the PCP office to the ED.   Seen in ED on 3/14 for dizziness.  He was recently admitted on 3/3 and discharged on 3/5 with nausea vomiting and DKA.  On EGD he was found to have [place] grade D reflux esophagitis and gastritis.  He has been admitted for intractable vomiting.  Hospital Course:  Principal Problem:   Intractable vomiting due to constipation causing Dehydration/hypotension/ tachycardia H/o [place] grade D reflux esophagitis and gastritis - vomiting was a result of severe constipation - Linzess is listed on med rec which can cause constipation and should be stopped - treated aggressively - initially with antiemetics and enemas and later with oral Mag citrate and continued enemas along with twice daily PPI, IV fluids - recommendations>  Miralax BID, Senna 2 tabs QHS PRN for no BM during the day, Dulcolax suppository for no BM for 2 days     Active Problems: Severe hunger - he has constantly asked for food in the hospital (and has been eating it all since yesterday) and is asking me to help him stop feeling hungry- he has also told the nurse that he eats when he is bored - please follow at group home    Schizophrenia, anxiety/ depression and dementia -Continue Seroquel, trazodone, fluphenazine, clonazepam, benztropine, divalproex, Aricept and Namenda - he receives [place] every month    Uncontrolled type 2 diabetes mellitus (HCC) -Pioglitazone, Invokana/Metformin, sitagliptin, Synjardy on hold - Continue sliding scale insulin and Glargine- increase dose today as he is tolerating solids and sugars are rising - resumed Januvia   Labs (Brief)          Component Value Date/Time   HGBA1C 7.8 (H) 11/25/2020 1638      Dyslipidemia -Continue pravastatin and WelChol     Discharge Exam: Vitals:   11/28/20 0720 11/28/20 1116  BP: 120/75 (!) 146/75  Pulse: (!) 112 (!) 108  Resp: 15 17  Temp: 98.7 F (37.1 C) 98.6 F (37 C)  SpO2: 100% 99%   Vitals:   11/28/20 0436 11/28/20 0500 11/28/20 0720 11/28/20 1116  BP:  119/78 120/75 (!) 146/75  Pulse: (!) 112 (!) 111 (!) 112 (!) 108  Resp: 18 16 15 17   Temp:  98.7 F (37.1 C) 98.7 F (37.1 C) 98.6 F (37 C)  TempSrc:  Axillary Oral  Oral  SpO2: 98% 99% 100% 99%  Weight:      Height:        General: Pt is alert, awake, not in acute distress Cardiovascular: RRR, S1/S2 +, no rubs, no gallops Respiratory: CTA bilaterally, no wheezing, no rhonchi Abdominal: Soft, NT, ND, bowel sounds + Extremities: no edema, no cyanosis   Discharge Instructions  Discharge Instructions    Diet - low sodium heart healthy   Complete by: As directed    Diet Carb Modified   Complete by: As directed    Increase activity slowly   Complete by: As directed       Allergies as of 11/28/2020      Reactions   Fish Allergy Rash   Shellfish Allergy Rash      Medication List    STOP taking these medications   Linzess 145 MCG Caps capsule Generic drug: linaclotide     TAKE these medications   albuterol 108 (90 Base) MCG/ACT inhaler Commonly known as: VENTOLIN HFA Inhale 2 puffs into the lungs every 4 (four) hours as needed for wheezing or shortness of breath.   ammonium lactate 12 % cream Commonly known as: AMLACTIN Apply 1 g topically daily.   benazepril 20 MG tablet Commonly known as: LOTENSIN Take 20 mg by mouth daily.   benztropine 1 MG tablet Commonly known as: COGENTIN Take 1 mg by mouth 2 (two) times daily.   bisacodyl 10 MG suppository Commonly known as: Dulcolax Place 1 suppository (10 mg total) rectally daily as needed for moderate constipation (if no BM in 2 days).   Breztri Aerosphere 160-9-4.8 MCG/ACT Aero Generic drug: Budeson-Glycopyrrol-Formoterol Inhale 2 puffs into the lungs 2 (two) times daily.   buPROPion 300 MG 24 hr tablet Commonly known as: WELLBUTRIN XL Take 300 mg by mouth daily.   Canagliflozin-metFORMIN HCl 50-1000 MG Tabs Take 1 tablet by mouth 2 (two) times daily with a meal.   clonazePAM 1 MG tablet Commonly known as: KLONOPIN Take 1 mg by mouth 3 (three) times daily.   colesevelam 625 MG tablet Commonly known as: WELCHOL Take 1,875 mg by mouth 2 (two) times daily.   divalproex 500 MG 24 hr tablet Commonly known as: DEPAKOTE ER Take 500 mg by mouth 2 (two) times daily.   fluPHENAZine 5 MG tablet Commonly known as: PROLIXIN Take 5 mg by mouth 2 (two) times daily. Pt also takes daily as needed for psychosis/agitation.   insulin glargine 100 UNIT/ML injection Commonly known as: LANTUS Inject 30 Units into the skin at bedtime.   Invega Trinza 819 MG/2.63ML Susy injection Generic drug: paliperidone Palmitate ER Inject 819 mg into the muscle every 30 (thirty) days.   Memantine  HCl-Donepezil HCl 28-10 MG Cp24 Take 1 capsule by mouth at bedtime.   pantoprazole 40 MG tablet Commonly known as: PROTONIX Take 1 tablet (40 mg total) by mouth 2 (two) times daily.   pioglitazone 45 MG tablet Commonly known as: ACTOS Take 45 mg by mouth daily.   polyethylene glycol 17 g packet Commonly known as: MIRALAX / GLYCOLAX Take 17 g by mouth 2 (two) times daily. What changed: when to take this   pravastatin 40 MG tablet Commonly known as: PRAVACHOL Take 40 mg by mouth at bedtime.   QUEtiapine 400 MG tablet Commonly known as: SEROQUEL Take 600 mg by mouth at bedtime.   senna 8.6 MG Tabs tablet Commonly known as: SENOKOT Take 2 tablets (17.2 mg total) by mouth at bedtime as needed for mild  constipation (if no BM all day).   sitaGLIPtin 100 MG tablet Commonly known as: JANUVIA Take 100 mg by mouth daily.   sodium phosphate 7-19 GM/118ML Enem Place 133 mLs (1 enema total) rectally daily as needed for severe constipation (if no BM in 3 days).   Synjardy 12.01-999 MG Tabs Generic drug: Empagliflozin-metFORMIN HCl Take 1 tablet by mouth 2 (two) times daily.   tiotropium 18 MCG inhalation capsule Commonly known as: SPIRIVA Place 18 mcg into inhaler and inhale daily.   traZODone 100 MG tablet Commonly known as: DESYREL Take 200 mg by mouth at bedtime.   Vitamin D (Ergocalciferol) 1.25 MG (50000 UNIT) Caps capsule Commonly known as: DRISDOL Take 50,000 Units by mouth every 7 (seven) days.       Allergies  Allergen Reactions  . Fish Allergy Rash  . Shellfish Allergy Rash      DG Chest 2 View  Result Date: 11/25/2020 CLINICAL DATA:  Vomiting. EXAM: CHEST - 2 VIEW COMPARISON:  November 10, 2020 FINDINGS: The lungs are mildly hyperinflated. Stable linear scarring is seen within the mid right lung and lateral aspect of the right lung base. Mild to moderate severity atelectasis and/or infiltrate is noted within the posterior aspect of the right lower lobe. A small  right pleural effusion is seen. No pneumothorax is identified. The heart size and mediastinal contours are within normal limits. There is moderate severity calcification of the aortic arch. Radiopaque surgical clips are seen within the right upper quadrant. The visualized skeletal structures are unremarkable. IMPRESSION: 1. Mild to moderate severity right lower lobe scarring and/or atelectasis with small right pleural effusion. Electronically Signed   By: Aram Candela M.D.   On: 11/25/2020 17:30   DG Chest 2 View  Result Date: 11/10/2020 CLINICAL DATA:  Midsternal chest pain and shortness of breath, belching EXAM: CHEST - 2 VIEW COMPARISON:  06/17/2020 FINDINGS: Frontal and lateral views of the chest demonstrate a stable cardiac silhouette. Chronic areas of scarring and atelectasis are seen, greatest at the right lung base. Trace right pleural effusion versus pleural thickening again noted, stable. No acute airspace disease. No pneumothorax. No acute bony abnormalities. IMPRESSION: 1. Chronic areas of scarring and atelectasis, greatest at the right lung base. 2. Trace right pleural effusion versus pleural thickening, stable. 3. No acute intrathoracic process. Electronically Signed   By: Sharlet Salina M.D.   On: 11/10/2020 18:03   DG Abdomen 1 View  Result Date: 11/25/2020 CLINICAL DATA:  Not tolerating oral liquids. EXAM: ABDOMEN - 1 VIEW COMPARISON:  August 02, 2011 FINDINGS: The bowel gas pattern is normal. A very large amount of stool is seen throughout the colon. Radiopaque surgical clips are seen within the right upper quadrant. No radio-opaque calculi or other significant radiographic abnormality are seen. IMPRESSION: Very large stool burden without evidence of bowel obstruction. Electronically Signed   By: Aram Candela M.D.   On: 11/25/2020 19:07   CT Chest Wo Contrast  Result Date: 11/11/2020 CLINICAL DATA:  Vomiting. EXAM: CT CHEST WITHOUT CONTRAST TECHNIQUE: Multidetector CT imaging  of the chest was performed following the standard protocol without IV contrast. COMPARISON:  June 15, 2020 FINDINGS: Cardiovascular: There is marked severity calcification of the aortic arch. Normal heart size. No pericardial effusion. Mediastinum/Nodes: No enlarged mediastinal or axillary lymph nodes. The thyroid gland and trachea demonstrate no significant findings. Moderate to marked severity diffuse esophageal wall thickening is noted. This involves the mid and distal portion of the esophagus. Lungs/Pleura: There is mild  emphysematous lung disease. Mild, stable linear scarring and/or atelectasis is seen within the anterior aspect of the right upper lobe. Stable moderate severity scarring and/or round atelectasis is seen within the posterior aspect of the right lung base. A mild area of stable linear scarring is seen along the posteromedial aspect of the right lung base the (axial CT images 120 through 127, CT series number 3). There is a small left pleural effusion. No pneumothorax is identified. Upper Abdomen: Multiple surgical clips are seen in the gallbladder fossa. A 3.2 cm x 2.4 cm low-attenuation left adrenal mass is seen. A 1.9 cm x 0.7 cm low-attenuation right adrenal mass is also noted. Musculoskeletal: No chest wall mass or suspicious bone lesions identified. IMPRESSION: 1. Stable moderate severity right basilar scarring and/or rounded atelectasis with chronic changes within the posterior medial aspect of the right lung base. 2. Esophageal wall thickening which may represent moderate to marked severity esophagitis. Sequelae associated with an underlying neoplasm cannot be excluded. 3.   Small left pleural effusion. 4. Bilateral adrenal adenomas. Emphysema (ICD10-J43.9). Electronically Signed   By: Aram Candelahaddeus  Houston M.D.   On: 11/11/2020 20:39   CT ABDOMEN PELVIS W CONTRAST  Result Date: 11/11/2020 CLINICAL DATA:  Vomiting. EXAM: CT ABDOMEN AND PELVIS WITH CONTRAST TECHNIQUE: Multidetector CT  imaging of the abdomen and pelvis was performed using the standard protocol following bolus administration of intravenous contrast. CONTRAST:  100mL OMNIPAQUE IOHEXOL 300 MG/ML  SOLN COMPARISON:  November 15, 2010 FINDINGS: Lower chest: Moderate severity atelectasis and/or infiltrate is seen within the right lung base. There is a small right pleural effusion. A thin (approximately 2 mm thick) linear area of air attenuation is seen along the medial aspect of the right lung base (axial CT images 4 through 7, CT series number 4). Hepatobiliary: There is diffuse fatty infiltration of the liver parenchyma. No focal liver abnormality is seen. Status post cholecystectomy. No biliary dilatation. Pancreas: Unremarkable. No pancreatic ductal dilatation or surrounding inflammatory changes. Spleen: Normal in size without focal abnormality. Adrenals/Urinary Tract: A stable 3.3 cm x 2.2 cm heterogeneous left adrenal mass is noted. A stable 1.4 cm x 0.6 cm low-attenuation right adrenal mass is also seen. Kidneys are normal, without renal calculi or hydronephrosis. 1.0 cm and 0.7 cm cysts are seen within the lateral aspect of the mid right kidney. A 2.0 cm x 1.6 cm simple cyst is also noted within the posterolateral aspect of the mid left kidney. Bladder is unremarkable. Stomach/Bowel: There is moderate to marked severity thickening of the visualized portion of the distal esophagus. Stomach is within normal limits. Appendix appears normal. A large amount of stool is seen throughout the colon. No evidence of bowel wall thickening, distention, or inflammatory changes. Vascular/Lymphatic: Aortic atherosclerosis. No enlarged abdominal or pelvic lymph nodes. Reproductive: Prostate is unremarkable. Other: No abdominal wall hernia or abnormality. No abdominopelvic ascites or intra-abdominal free air. Musculoskeletal: No acute or significant osseous findings. IMPRESSION: 1. Findings worrisome for the presence of a small pneumothorax along the  posteromedial aspect of the right lung base. Correlation with chest CT is recommended. 2. Moderate severity right basilar atelectasis and/or infiltrate with a small right pleural effusion. 3. Findings which may represent moderate to marked severity esophagitis within the visualized portion of the distal esophagus. Sequelae associated with an underlying neoplastic process cannot be excluded. 4. Evidence of prior cholecystectomy. 5. Findings likely consistent with stable bilateral adrenal adenomas. 6. Aortic atherosclerosis. Aortic Atherosclerosis (ICD10-I70.0). Electronically Signed   By: Aram Candelahaddeus  Houston  M.D.   On: 11/11/2020 19:26   DG Abd Portable 2V  Result Date: 11/27/2020 CLINICAL DATA:  Abdominal pain and distension EXAM: PORTABLE ABDOMEN - 2 VIEW COMPARISON:  Multiple exams, including 11/25/2020 FINDINGS: Chronic bandlike and rounded densities at the right lung base similar to 11/25/2020. Much of this may be from scarring. Mildly blunted right lateral costophrenic angle. Marked prominence of stool throughout the colon and rectum, similar to prior. No obvious dilated small bowel. No appreciable air-fluid levels. IMPRESSION: 1. Marked prominence of stool throughout the colon and rectum, similar to prior. Correlate with fecal impaction/constipation. 2. Chronic bandlike and rounded densities at the right lung base, favoring scarring. Suspected trace right pleural effusion. Electronically Signed   By: Gaylyn Rong M.D.   On: 11/27/2020 11:32     The results of significant diagnostics from this hospitalization (including imaging, microbiology, ancillary and laboratory) are listed below for reference.     Microbiology: Recent Results (from the past 240 hour(s))  Resp Panel by RT-PCR (Flu A&B, Covid) Nasopharyngeal Swab     Status: None   Collection Time: 11/25/20  5:03 PM   Specimen: Nasopharyngeal Swab; Nasopharyngeal(NP) swabs in vial transport medium  Result Value Ref Range Status   SARS  Coronavirus 2 by RT PCR NEGATIVE NEGATIVE Final    Comment: (NOTE) SARS-CoV-2 target nucleic acids are NOT DETECTED.  The SARS-CoV-2 RNA is generally detectable in upper respiratory specimens during the acute phase of infection. The lowest concentration of SARS-CoV-2 viral copies this assay can detect is 138 copies/mL. A negative result does not preclude SARS-Cov-2 infection and should not be used as the sole basis for treatment or other patient management decisions. A negative result may occur with  improper specimen collection/handling, submission of specimen other than nasopharyngeal swab, presence of viral mutation(s) within the areas targeted by this assay, and inadequate number of viral copies(<138 copies/mL). A negative result must be combined with clinical observations, patient history, and epidemiological information. The expected result is Negative.  Fact Sheet for Patients:  BloggerCourse.com  Fact Sheet for Healthcare Providers:  SeriousBroker.it  This test is no t yet approved or cleared by the [place] FDA and  has been authorized for detection and/or diagnosis of SARS-CoV-2 by FDA under an Emergency Use Authorization (EUA). This EUA will remain  in effect (meaning this test can be used) for the duration of the COVID-19 declaration under Section 564(b)(1) of the Act, 21 U.S.C.section 360bbb-3(b)(1), unless the authorization is terminated  or revoked sooner.       Influenza A by PCR NEGATIVE NEGATIVE Final   Influenza B by PCR NEGATIVE NEGATIVE Final    Comment: (NOTE) The Xpert Xpress SARS-CoV-2/FLU/RSV plus assay is intended as an aid in the diagnosis of influenza from Nasopharyngeal swab specimens and should not be used as a sole basis for treatment. Nasal washings and aspirates are unacceptable for Xpert Xpress SARS-CoV-2/FLU/RSV testing.  Fact Sheet for  Patients: BloggerCourse.com  Fact Sheet for Healthcare Providers: SeriousBroker.it  This test is not yet approved or cleared by the [place] FDA and has been authorized for detection and/or diagnosis of SARS-CoV-2 by FDA under an Emergency Use Authorization (EUA). This EUA will remain in effect (meaning this test can be used) for the duration of the COVID-19 declaration under Section 564(b)(1) of the Act, 21 U.S.C. section 360bbb-3(b)(1), unless the authorization is terminated or revoked.  Performed at [facility], [address], [place], [place] [address]   MRSA PCR Screening  Status: None   Collection Time: 11/26/20 12:35 AM   Specimen: Nasal Mucosa; Nasopharyngeal  Result Value Ref Range Status   MRSA by PCR NEGATIVE NEGATIVE Final    Comment:        The GeneXpert MRSA Assay (FDA approved for NASAL specimens only), is one component of a comprehensive MRSA colonization surveillance program. It is not intended to diagnose MRSA infection nor to guide or monitor treatment for MRSA infections. Performed at [facility], [address] [place], [place] [address]      Labs: BNP (last 3 results) No results for input(s): BNP in the last 8760 hours. Basic Metabolic Panel: Recent Labs  Lab 11/25/20 1630 11/25/20 1638 11/26/20 0526  NA 133*  --  134*  K 4.5  --  4.7  CL 95*  --  99  CO2 27  --  29  GLUCOSE 229*  --  135*  BUN 31*  --  20  CREATININE 1.17  --  0.85  CALCIUM 9.4  --  9.0  MG 1.9  --  2.1  PHOS  --  3.3 2.3*   Liver Function Tests: Recent Labs  Lab 11/25/20 1630 11/26/20 0526  AST 11* 11*  ALT 10 9  ALKPHOS 71 58  BILITOT 0.9 0.6  PROT 6.6 5.6*  ALBUMIN 3.4* 3.0*   Recent Labs  Lab 11/25/20 1630  LIPASE 33   No results for input(s): AMMONIA in the last 168 hours. CBC: Recent Labs  Lab 11/25/20 1630 11/26/20 0526  WBC 11.0* 11.2*  NEUTROABS 7.5 6.9  HGB  14.4 12.3*  HCT 42.4 37.3*  MCV 87.6 88.6  PLT 284 230   Cardiac Enzymes: Recent Labs  Lab 11/25/20 1638  CKTOTAL 35*   BNP: Invalid input(s): POCBNP CBG: Recent Labs  Lab 11/27/20 1147 11/27/20 1648 11/27/20 2052 11/28/20 0720 11/28/20 1118  GLUCAP 246* 297* 299* 221* 279*   D-Dimer No results for input(s): DDIMER in the last 72 hours. Hgb A1c Recent Labs    11/25/20 1638  HGBA1C 7.8*   Lipid Profile No results for input(s): CHOL, HDL, LDLCALC, TRIG, CHOLHDL, LDLDIRECT in the last 72 hours. Thyroid function studies Recent Labs    11/26/20 0526  TSH 2.462   Anemia work up No results for input(s): VITAMINB12, FOLATE, FERRITIN, TIBC, IRON, RETICCTPCT in the last 72 hours. Urinalysis    Component Value Date/Time   COLORURINE YELLOW (A) 11/11/2020 1900   APPEARANCEUR CLEAR (A) 11/11/2020 1900   LABSPEC 1.028 11/11/2020 1900   PHURINE 5.0 11/11/2020 1900   GLUCOSEU >=500 (A) 11/11/2020 1900   HGBUR NEGATIVE 11/11/2020 1900   HGBUR negative 06/10/2008 0920   BILIRUBINUR NEGATIVE 11/11/2020 1900   KETONESUR 20 (A) 11/11/2020 1900   PROTEINUR NEGATIVE 11/11/2020 1900   UROBILINOGEN 1.0 08/04/2008 1322   NITRITE NEGATIVE 11/11/2020 1900   LEUKOCYTESUR NEGATIVE 11/11/2020 1900   Sepsis Labs Invalid input(s): PROCALCITONIN,  WBC,  LACTICIDVEN Microbiology Recent Results (from the past 240 hour(s))  Resp Panel by RT-PCR (Flu A&B, Covid) Nasopharyngeal Swab     Status: None   Collection Time: 11/25/20  5:03 PM   Specimen: Nasopharyngeal Swab; Nasopharyngeal(NP) swabs in vial transport medium  Result Value Ref Range Status   SARS Coronavirus 2 by RT PCR NEGATIVE NEGATIVE Final    Comment: (NOTE) SARS-CoV-2 target nucleic acids are NOT DETECTED.  The SARS-CoV-2 RNA is generally detectable in upper respiratory specimens during the acute phase of infection. The lowest concentration of SARS-CoV-2 viral copies  this assay can detect is 138 copies/mL. A negative  result does not preclude SARS-Cov-2 infection and should not be used as the sole basis for treatment or other patient management decisions. A negative result may occur with  improper specimen collection/handling, submission of specimen other than nasopharyngeal swab, presence of viral mutation(s) within the areas targeted by this assay, and inadequate number of viral copies(<138 copies/mL). A negative result must be combined with clinical observations, patient history, and epidemiological information. The expected result is Negative.  Fact Sheet for Patients:  BloggerCourse.com  Fact Sheet for Healthcare Providers:  SeriousBroker.it  This test is no t yet approved or cleared by the [place] FDA and  has been authorized for detection and/or diagnosis of SARS-CoV-2 by FDA under an Emergency Use Authorization (EUA). This EUA will remain  in effect (meaning this test can be used) for the duration of the COVID-19 declaration under Section 564(b)(1) of the Act, 21 U.S.C.section 360bbb-3(b)(1), unless the authorization is terminated  or revoked sooner.       Influenza A by PCR NEGATIVE NEGATIVE Final   Influenza B by PCR NEGATIVE NEGATIVE Final    Comment: (NOTE) The Xpert Xpress SARS-CoV-2/FLU/RSV plus assay is intended as an aid in the diagnosis of influenza from Nasopharyngeal swab specimens and should not be used as a sole basis for treatment. Nasal washings and aspirates are unacceptable for Xpert Xpress SARS-CoV-2/FLU/RSV testing.  Fact Sheet for Patients: BloggerCourse.com  Fact Sheet for Healthcare Providers: SeriousBroker.it  This test is not yet approved or cleared by the [place] FDA and has been authorized for detection and/or diagnosis of SARS-CoV-2 by FDA under an Emergency Use Authorization (EUA). This EUA will remain in effect (meaning this test can be used)  for the duration of the COVID-19 declaration under Section 564(b)(1) of the Act, 21 U.S.C. section 360bbb-3(b)(1), unless the authorization is terminated or revoked.  Performed at [facility], [address] [place], [place] [address]   MRSA PCR Screening     Status: None   Collection Time: 11/26/20 12:35 AM   Specimen: Nasal Mucosa; Nasopharyngeal  Result Value Ref Range Status   MRSA by PCR NEGATIVE NEGATIVE Final    Comment:        The GeneXpert MRSA Assay (FDA approved for NASAL specimens only), is one component of a comprehensive MRSA colonization surveillance program. It is not intended to diagnose MRSA infection nor to guide or monitor treatment for MRSA infections. Performed at [facility], [address] [place], [place] [address]      Time coordinating discharge in minutes: 65  SIGNED:   Calvert Cantor, MD  [facility] 11/28/2020, 11:31 AM

## 2020-11-28 NOTE — [place] FL2 (Addendum)
[place] MEDICAID FL2 LEVEL OF CARE SCREENING TOOL     IDENTIFICATION  Patient Name: Johnny [name] Birthdate: 1956/05/23 Sex: male Admission Date (Current Location): 11/25/2020  [place] and [place] Number:  Chiropodist and Address:  [facility], [address], [place], [place] [address]      Provider Number: [id number]  Attending Physician Name and Address:  Calvert Cantor, MD  Relative Name and Phone Number:  Meredith Staggers [phone] (Left VM regarding discharge back to group home.)    Current Level of Care: Hospital Recommended Level of Care: Other (Comment) (Group Home [facility] [phone]) Prior Approval Number:    Date Approved/Denied:   PASRR Number:    Discharge Plan: Other (Comment) (Group Home)    Current Diagnoses: Patient Active Problem List   Diagnosis Date Noted  . Malnutrition of moderate degree 11/28/2020  . Constipation due to slow transit 11/28/2020  . Hypotension due to hypovolemia 11/28/2020  . Dehydration, severe 11/28/2020  . Intractable vomiting with nausea 11/26/2020  . Dehydration 11/25/2020  . Esophagitis, [place] grade D 11/25/2020  . GIB (gastrointestinal bleeding) 11/12/2020  . Hematemesis with nausea   . Abnormal CT scan, esophagus   . Nausea   . Uncontrolled type 2 diabetes mellitus (HCC) 11/11/2020  . Bacteremia 06/17/2020  . Acute renal failure (ARF) (HCC) 07/05/2015  . SINUS TACHYCARDIA 06/10/2008  . HYPOTENSION, ORTHOSTATIC 06/10/2008  . Type 2 diabetes mellitus with hyperlipidemia (HCC) 05/19/2008  . HYPOGLYCEMIA 04/27/2008  . HYPERLIPIDEMIA 01/22/2008  . Schizophrenia (HCC) 01/22/2008  . ANXIETY 01/22/2008  . DEPRESSION 01/22/2008  . Essential hypertension 01/22/2008  . MYOCARDIAL INFARCTION, HX OF 01/22/2008  . BRONCHITIS, CHRONIC 01/22/2008  . GERD 01/22/2008    Orientation RESPIRATION BLADDER Height & Weight     Self,Time,Situation  Normal Continent  Weight: 69 kg Height:  5\' 9"  (175.3 cm)  BEHAVIORAL SYMPTOMS/MOOD NEUROLOGICAL BOWEL NUTRITION STATUS      Continent Diet (Carb modified)  AMBULATORY STATUS COMMUNICATION OF NEEDS Skin   Independent Verbally Normal                       Personal Care Assistance Level of Assistance  Bathing,Feeding,Dressing Bathing Assistance: Independent Feeding assistance: Independent Dressing Assistance: Independent     Functional Limitations Info             SPECIAL CARE FACTORS FREQUENCY                       Contractures Contractures Info: Not present    Additional Factors Info  Code Status,Allergies Code Status Info: Full Code Allergies Info: Shellfish and Fish (rash)           Current Medications (11/28/2020): Discharge Medications: From Discharge Summary Medication List    STOP taking these medications   Linzess 145 MCG Caps capsule Generic drug: linaclotide     TAKE these medications   albuterol 108 (90 Base) MCG/ACT inhaler Commonly known as: VENTOLIN HFA Inhale 2 puffs into the lungs every 4 (four) hours as needed for wheezing or shortness of breath.   ammonium lactate 12 % cream Commonly known as: AMLACTIN Apply 1 g topically daily.   benazepril 20 MG tablet Commonly known as: LOTENSIN Take 20 mg by mouth daily.   benztropine 1 MG tablet Commonly known as: COGENTIN Take 1 mg by mouth 2 (two) times daily.   bisacodyl 10 MG suppository Commonly known as: Dulcolax Place 1 suppository (10  mg total) rectally daily as needed for moderate constipation (if no BM in 2 days).   Breztri Aerosphere 160-9-4.8 MCG/ACT Aero Generic drug: Budeson-Glycopyrrol-Formoterol Inhale 2 puffs into the lungs 2 (two) times daily.   buPROPion 300 MG 24 hr tablet Commonly known as: WELLBUTRIN XL Take 300 mg by mouth daily.   Canagliflozin-metFORMIN HCl 50-1000 MG Tabs Take 1 tablet by mouth 2 (two) times daily with a meal.   clonazePAM 1 MG  tablet Commonly known as: KLONOPIN Take 1 mg by mouth 3 (three) times daily.   colesevelam 625 MG tablet Commonly known as: WELCHOL Take 1,875 mg by mouth 2 (two) times daily.   divalproex 500 MG 24 hr tablet Commonly known as: DEPAKOTE ER Take 500 mg by mouth 2 (two) times daily.   fluPHENAZine 5 MG tablet Commonly known as: PROLIXIN Take 5 mg by mouth 2 (two) times daily. Pt also takes daily as needed for psychosis/agitation.   insulin glargine 100 UNIT/ML injection Commonly known as: LANTUS Inject 30 Units into the skin at bedtime.   Invega Trinza 819 MG/2.63ML Susy injection Generic drug: paliperidone Palmitate ER Inject 819 mg into the muscle every 30 (thirty) days.   Memantine HCl-Donepezil HCl 28-10 MG Cp24 Take 1 capsule by mouth at bedtime.   pantoprazole 40 MG tablet Commonly known as: PROTONIX Take 1 tablet (40 mg total) by mouth 2 (two) times daily.   pioglitazone 45 MG tablet Commonly known as: ACTOS Take 45 mg by mouth daily.   polyethylene glycol 17 g packet Commonly known as: MIRALAX / GLYCOLAX Take 17 g by mouth 2 (two) times daily. What changed: when to take this   pravastatin 40 MG tablet Commonly known as: PRAVACHOL Take 40 mg by mouth at bedtime.   QUEtiapine 400 MG tablet Commonly known as: SEROQUEL Take 600 mg by mouth at bedtime.   senna 8.6 MG Tabs tablet Commonly known as: SENOKOT Take 2 tablets (17.2 mg total) by mouth at bedtime as needed for mild constipation (if no BM all day).   sitaGLIPtin 100 MG tablet Commonly known as: JANUVIA Take 100 mg by mouth daily.   sodium phosphate 7-19 GM/118ML Enem Place 133 mLs (1 enema total) rectally daily as needed for severe constipation (if no BM in 3 days).   Synjardy 12.01-999 MG Tabs Generic drug: Empagliflozin-metFORMIN HCl Take 1 tablet by mouth 2 (two) times daily.   tiotropium 18 MCG inhalation capsule Commonly known as: SPIRIVA Place 18 mcg into inhaler and  inhale daily.   traZODone 100 MG tablet Commonly known as: DESYREL Take 200 mg by mouth at bedtime.   Vitamin D (Ergocalciferol) 1.25 MG (50000 UNIT) Caps capsule Commonly known as: DRISDOL Take 50,000 Units by mouth every 7 (seven) days.       Relevant Imaging Results:  Relevant Lab Results:   Additional Information SS#: [id number]  Bing Quarry, RN

## 2020-11-28 NOTE — TOC Progression Note (Signed)
Transition of Care [facility]) - Progression Note    Patient Details  Name: Johnny [name] MRN: [id number] Date of Birth: [birth year]  Transition of Care [facility]) CM/SW Contact  Johnny Quarry, RN Phone Number: 11/28/2020, 12:53 PM  Clinical Narrative: 11/28/20 Admitted 3/17 for intractable N&V. Esophagitis, coffee ground emesis, poor oral intake. Htx schizophrenia per provider notes. EGD completed and placed on PPI per notes.  Patient is being discharged back to Group Home per provider notes/discharge order. Reached out to provider and Unit RN to see if any TOC needs prior to or to facilitate discharge back to group home.  Johnny Cirri RN CM Legal Guardian is Johnny [name], [phone] [facility]).  Group Home information:  [facility]  In Care of: Johnny [name] is not associated with any group and cell phone is hers now) [phone] Number listed at [facility].  Facility address:  [address] [place] [address].  [facility] (5)  [facility]  [address] [place], [place] [address]   [id number]  [phone] [facility]  [facility]  [address] [place], [place] [address]  [phone] [id number]   1400: Spoke with Johnny [name] at facility and will pick patient up for transportation back to group home. [phone] Requested later discharge so patient can received needed medications before he can get them filled tomorrow at [facility]. [place] FL2 requested. He spoke with guardian Friday. RN CM has been unable to reach her except to leave a VM. Johnny Cirri RN CM    Expected Discharge Plan and Services           Expected Discharge Date: 11/28/20                                     Social Determinants of Health (SDOH) Interventions    Readmission Risk Interventions Readmission Risk Prevention Plan 06/19/2020  PCP or Specialist Appt within 3-5  Days Complete  HRI or Home Care Consult Complete  Palliative Care Screening Not Applicable  Medication Review (RN Care Manager) Complete  Some recent data might be hidden

## 2020-11-28 NOTE — Plan of Care (Signed)
°  Problem: Education: Goal: Knowledge of General Education information will improve Description: Including pain rating scale, medication(s)/side effects and non-pharmacologic comfort measures Outcome: Adequate for Discharge   Problem: Health Behavior/Discharge Planning: Goal: Ability to manage health-related needs will improve Outcome: Adequate for Discharge   Problem: Clinical Measurements: Goal: Ability to maintain clinical measurements within normal limits will improve Outcome: Adequate for Discharge Goal: Will remain free from infection Outcome: Adequate for Discharge Goal: Diagnostic test results will improve Outcome: Adequate for Discharge Goal: Respiratory complications will improve Outcome: Adequate for Discharge Goal: Cardiovascular complication will be avoided Outcome: Adequate for Discharge   Problem: Activity: Goal: Risk for activity intolerance will decrease Outcome: Adequate for Discharge   Problem: Nutrition: Goal: Adequate nutrition will be maintained Outcome: Adequate for Discharge   Problem: Coping: Goal: Level of anxiety will decrease Outcome: Adequate for Discharge   Problem: Elimination: Goal: Will not experience complications related to bowel motility Outcome: Adequate for Discharge Goal: Will not experience complications related to urinary retention Outcome: Adequate for Discharge   Problem: Pain Managment: Goal: General experience of comfort will improve Outcome: Adequate for Discharge   Problem: Safety: Goal: Ability to remain free from injury will improve Outcome: Adequate for Discharge   Problem: Skin Integrity: Goal: Risk for impaired skin integrity will decrease Outcome: Adequate for Discharge   Problem: Nutrition Goal: Nutritional status is improving Description: Monitor and assess patient for malnutrition (ex- brittle hair, bruises, dry skin, pale skin and conjunctiva, muscle wasting, smooth red tongue, and disorientation).  Collaborate with interdisciplinary team and initiate plan and interventions as ordered.  Monitor patient's weight and dietary intake as ordered or per policy. Utilize nutrition screening tool and intervene per policy. Determine patient's food preferences and provide high-protein, high-caloric foods as appropriate.  Outcome: Adequate for Discharge   Problem: Nutrition Goal: Patient maintains adequate hydration Outcome: Adequate for Discharge Goal: Patient maintains weight Outcome: Adequate for Discharge Goal: Patient/Family demonstrates understanding of diet Outcome: Adequate for Discharge Goal: Patient/Family independently completes tube feeding Outcome: Adequate for Discharge Goal: Patient will have no more than 5 lb weight change during LOS Outcome: Adequate for Discharge Goal: Patient will utilize adaptive techniques to administer nutrition Outcome: Adequate for Discharge Goal: Patient will verbalize dietary restrictions Outcome: Adequate for Discharge   Problem: Education: Goal: Ability to state activities that reduce stress will improve Outcome: Adequate for Discharge   Problem: Coping: Goal: Ability to identify and develop effective coping behavior will improve Outcome: Adequate for Discharge   Problem: Self-Concept: Goal: Ability to identify factors that promote anxiety will improve Outcome: Adequate for Discharge Goal: Level of anxiety will decrease Outcome: Adequate for Discharge Goal: Ability to modify response to factors that promote anxiety will improve Outcome: Adequate for Discharge

## 2020-12-06 ENCOUNTER — Other Ambulatory Visit: Payer: Self-pay

## 2020-12-06 DIAGNOSIS — F17210 Nicotine dependence, cigarettes, uncomplicated: Secondary | ICD-10-CM | POA: Diagnosis not present

## 2020-12-06 DIAGNOSIS — Z79899 Other long term (current) drug therapy: Secondary | ICD-10-CM | POA: Diagnosis not present

## 2020-12-06 DIAGNOSIS — Z7984 Long term (current) use of oral hypoglycemic drugs: Secondary | ICD-10-CM | POA: Diagnosis not present

## 2020-12-06 DIAGNOSIS — E119 Type 2 diabetes mellitus without complications: Secondary | ICD-10-CM | POA: Diagnosis not present

## 2020-12-06 DIAGNOSIS — K5900 Constipation, unspecified: Secondary | ICD-10-CM | POA: Diagnosis not present

## 2020-12-06 DIAGNOSIS — I10 Essential (primary) hypertension: Secondary | ICD-10-CM | POA: Diagnosis not present

## 2020-12-06 DIAGNOSIS — Z794 Long term (current) use of insulin: Secondary | ICD-10-CM | POA: Diagnosis not present

## 2020-12-06 LAB — URINALYSIS, COMPLETE (UACMP) WITH MICROSCOPIC
Bilirubin Urine: NEGATIVE
Glucose, UA: >=500 mg/dL — AB
Hgb urine dipstick: NEGATIVE
Ketones, ur: 5 mg/dL — AB
Leukocytes,Ua: NEGATIVE
Nitrite: NEGATIVE
Protein, ur: NEGATIVE mg/dL
Specific Gravity, Urine: 1.032 — ABNORMAL HIGH (ref 1.005–1.030)
pH: 5 (ref 5.0–8.0)

## 2020-12-06 LAB — COMPREHENSIVE METABOLIC PANEL
ALT: 14 U/L (ref 0–44)
AST: 12 U/L — ABNORMAL LOW (ref 15–41)
Albumin: 3.7 g/dL (ref 3.5–5.0)
Alkaline Phosphatase: 71 U/L (ref 38–126)
Anion gap: 10 (ref 5–15)
BUN: 23 mg/dL (ref 8–23)
CO2: 26 mmol/L (ref 22–32)
Calcium: 9.3 mg/dL (ref 8.9–10.3)
Chloride: 100 mmol/L (ref 98–111)
Creatinine, Ser: 1.03 mg/dL (ref 0.61–1.24)
GFR, Estimated: >60 mL/min (ref >60)
Glucose, Bld: 146 mg/dL — ABNORMAL HIGH (ref 70–99)
Potassium: 4.6 mmol/L (ref 3.5–5.1)
Sodium: 136 mmol/L (ref 135–145)
Total Bilirubin: 0.6 mg/dL (ref 0.3–1.2)
Total Protein: 6.9 g/dL (ref 6.5–8.1)

## 2020-12-06 LAB — CBC
HCT: 36.8 % — ABNORMAL LOW (ref 39.0–52.0)
Hemoglobin: 12.1 g/dL — ABNORMAL LOW (ref 13.0–17.0)
MCH: 29.7 pg (ref 26.0–34.0)
MCHC: 32.9 g/dL (ref 30.0–36.0)
MCV: 90.2 fL (ref 80.0–100.0)
Platelets: 345 10*3/uL (ref 150–400)
RBC: 4.08 MIL/uL — ABNORMAL LOW (ref 4.22–5.81)
RDW: 16.4 % — ABNORMAL HIGH (ref 11.5–15.5)
WBC: 9.5 10*3/uL (ref 4.0–10.5)
nRBC: 0 % (ref 0.0–0.2)

## 2020-12-06 LAB — LIPASE, BLOOD: Lipase: 39 U/L (ref 11–51)

## 2020-12-06 NOTE — ED Triage Notes (Signed)
Pt BIB facility staff. Per paperwork with guardian, pt endorses no bowel movement for 3 days, refuses suppository or enema at facility ([facility]). Denies loss of appetite, fever, nausea, vomiting, or issues with urination. The facility has been giving him polythylene glycol daily for several days. Pt endorses 10/10 generalized abdominal pain at this time. Pt appears in no acute distress at this time, VSS.

## 2020-12-07 ENCOUNTER — Emergency Department: Payer: Medicare Other

## 2020-12-07 ENCOUNTER — Emergency Department
Admission: EM | Admit: 2020-12-07 | Discharge: 2020-12-07 | Disposition: A | Discharge status: Home / Self Care | Payer: Medicare Other | Attending: Emergency Medicine | Admitting: Emergency Medicine

## 2020-12-07 DIAGNOSIS — K5900 Constipation, unspecified: Secondary | ICD-10-CM | POA: Diagnosis not present

## 2020-12-07 MED ORDER — LACTULOSE 10 GM/15ML PO SOLN
20.0000 g | ORAL | Status: AC
  Administered 2020-12-07: 20 g via ORAL

## 2020-12-07 MED ORDER — LACTULOSE 20 GM/30ML PO SOLN: 20.0000 g | Freq: Two times a day (BID) | ORAL | 0 refills | Status: AC

## 2020-12-07 MED ORDER — LACTULOSE 10 GM/15ML PO SOLN
30.0000 g | ORAL | Status: DC
  Filled 2020-12-07: qty 60

## 2020-12-07 MED ORDER — FLEET ENEMA 7-19 GM/118ML RE ENEM: 1.0000 | ENEMA | Freq: Once | RECTAL | Status: DC

## 2020-12-07 MED ORDER — DOCUSATE SODIUM 100 MG PO CAPS: 100.0000 mg | ORAL_CAPSULE | Freq: Two times a day (BID) | ORAL | 2 refills | Status: AC

## 2020-12-07 NOTE — ED Triage Notes (Signed)
Emergency Medicine Provider Triage Evaluation Note  Johnny [name] , a 65 y.o. male  was evaluated in triage.  Pt complains of abdominal pain and constipation.  Review of Systems  Positive: constipation, abdominal bloating Negative: nausea/vomiting, fever, CP, SOB  Physical Exam  BP (!) 123/98   Pulse 84   Temp 98.4 F (36.9 C) (Oral)   Resp 16   Ht 1.753 m (5\' 9" )   Wt 69 kg   SpO2 99%   BMI 22.46 kg/m  Gen:   Awake, no distress   HEENT:  Atraumatic  Resp:  Normal effort  Cardiac:  Normal rate  Abd:   Mildly distended, firm abdomen but without TTP MSK:   Moves extremities without difficulty. Ambulates without difficulty. Neuro:  Speech clear , moving all four extremities  Medical Decision Making  Medically screening exam initiated at 12:42 AM.  Appropriate orders placed.  Joseff L Kruger was informed that the remainder of the evaluation will be completed by another provider, this initial triage assessment does not replace that evaluation, and the importance of remaining in the ED until their evaluation is complete.  Clinical Impression  Constipation.  Patient lives at a group home, has been getting his regular medication including daily Miralax but refuses suppository and enemas.  Admitted 1-2 weeks ago for intractable N/V and constipation, but has no N/V at this time.  Labs reassuring tonight, no significant electrolyte or metabolic abnormalities. VSS.  Plan:  Lactulose 30 g PO and acute abdomen series to eval for stool burden and possible SBO/ileus.  Patient and group home representative understand the plan.   , MD 12/07/20 [phone]

## 2020-12-07 NOTE — ED Notes (Signed)
RN attempted to contact pt legal guardian at this time to notify of discharge. No answer at number listed in pt demographics. A voice message was left notifying Ms. Laural Benes that a patient was being discharged that she was listed as the legal guardian for, and if she had any questions to please call [phone].

## 2020-12-07 NOTE — ED Provider Notes (Signed)
[facility] Emergency Department Provider Note  ____________________________________________   Event Date/Time   First MD Initiated Contact with Patient 12/07/20 [phone]     (approximate)  I have reviewed the triage vital signs and the nursing notes.   HISTORY  Chief Complaint Constipation and Abdominal Pain    HPI Susann GivensJohnnie L Nouri is a 65 y.o. male with history of hypertension, diabetes, hyperlipidemia, schizophrenia who presents to the emergency department with complaints of constipation with his caregiver from [facility].  No bowel movement in the past 3 days.  He is passing gas.  No nausea or vomiting.  No fever.  Complaining of abdominal bloating and cramping in the lower abdomen.  Using MiraLAX regularly but refusing suppositories and enemas.  No history of bowel obstruction.        Past Medical History:  Diagnosis Date  . Anemia   . Clostridium difficile colitis   . Diabetes mellitus without complication (HCC)   . DM (diabetes mellitus) (HCC)   . GERD (gastroesophageal reflux disease)   . Herpes   . Hyperlipidemia   . Hypertension   . Schizophrenia [facility](HCC)     Patient Active Problem List   Diagnosis Date Noted  . Malnutrition of moderate degree 11/28/2020  . Constipation due to slow transit 11/28/2020  . Hypotension due to hypovolemia 11/28/2020  . Dehydration, severe 11/28/2020  . Intractable vomiting with nausea 11/26/2020  . Dehydration 11/25/2020  . Esophagitis, [place] grade D 11/25/2020  . GIB (gastrointestinal bleeding) 11/12/2020  . Hematemesis with nausea   . Abnormal CT scan, esophagus   . Nausea   . Uncontrolled type 2 diabetes mellitus (HCC) 11/11/2020  . Bacteremia 06/17/2020  . Acute renal failure (ARF) (HCC) 07/05/2015  . SINUS TACHYCARDIA 06/10/2008  . HYPOTENSION, ORTHOSTATIC 06/10/2008  . Type 2 diabetes mellitus with hyperlipidemia (HCC) 05/19/2008  . HYPOGLYCEMIA 04/27/2008  . HYPERLIPIDEMIA  01/22/2008  . Schizophrenia (HCC) 01/22/2008  . ANXIETY 01/22/2008  . DEPRESSION 01/22/2008  . Essential hypertension 01/22/2008  . MYOCARDIAL INFARCTION, HX OF 01/22/2008  . BRONCHITIS, CHRONIC 01/22/2008  . GERD 01/22/2008    Past Surgical History:  Procedure Laterality Date  . CHOLECYSTECTOMY    . ESOPHAGOGASTRODUODENOSCOPY (EGD) WITH PROPOFOL N/A 11/12/2020   Procedure: ESOPHAGOGASTRODUODENOSCOPY (EGD) WITH PROPOFOL;  Surgeon: Midge MiniumWohl, Darren, MD;  [facility];  Service: Endoscopy;  Laterality: N/A;    Prior to Admission medications   Medication Sig Start Date End Date Taking? Authorizing Provider  docusate sodium (COLACE) 100 MG capsule Take 1 capsule (100 mg total) by mouth 2 (two) times daily. 12/07/20 12/07/21 Yes Yahsir Wickens, Layla MawKristen N, DO  Lactulose 20 GM/30ML SOLN Take 30 mLs (20 g total) by mouth in the morning and at bedtime for 7 days. 12/07/20 12/14/20 Yes Sharita Bienaime N, DO  albuterol (PROVENTIL HFA;VENTOLIN HFA) 108 (90 BASE) MCG/ACT inhaler Inhale 2 puffs into the lungs every 4 (four) hours as needed for wheezing or shortness of breath.    [provider]  ammonium lactate (AMLACTIN) 12 % cream Apply 1 g topically daily.     [provider]  benazepril (LOTENSIN) 20 MG tablet Take 20 mg by mouth daily. 06/11/20   [provider]  benztropine (COGENTIN) 1 MG tablet Take 1 mg by mouth 2 (two) times daily.    [provider]  bisacodyl (DULCOLAX) 10 MG suppository Place 1 suppository (10 mg total) rectally daily as needed for moderate constipation (if no BM in 2 days). 11/28/20  Calvert Cantor, MD  BREZTRI AEROSPHERE 160-9-4.8 MCG/ACT AERO Inhale 2 puffs into the lungs 2 (two) times daily. 05/14/20   [provider]  buPROPion (WELLBUTRIN XL) 300 MG 24 hr tablet Take 300 mg by mouth daily. 06/11/20   [provider]  Canagliflozin-metFORMIN HCl 50-1000 MG TABS Take 1 tablet by mouth 2 (two) times daily with a meal.    [provider]  clonazePAM (KLONOPIN) 1 MG tablet Take 1 mg by mouth 3 (three) times daily.    [provider]  colesevelam (WELCHOL) 625 MG tablet Take 1,875 mg by mouth 2 (two) times daily.     [provider]  divalproex (DEPAKOTE ER) 500 MG 24 hr tablet Take 500 mg by mouth 2 (two) times daily.     [provider]  fluPHENAZine (PROLIXIN) 5 MG tablet Take 5 mg by mouth 2 (two) times daily. Pt also takes daily as needed for psychosis/agitation.    [provider]  insulin glargine (LANTUS) 100 UNIT/ML injection Inject 30 Units into the skin at bedtime.    [provider]  INVEGA TRINZA 819 MG/2.625ML injection Inject 819 mg into the muscle every 30 (thirty) days. 06/01/20   [provider]  Memantine HCl-Donepezil HCl 28-10 MG CP24 Take 1 capsule by mouth at bedtime.     [provider]  pantoprazole (PROTONIX) 40 MG tablet Take 1 tablet (40 mg total) by mouth 2 (two) times daily. 11/13/20 12/13/20  Lynn Ito, MD  pioglitazone (ACTOS) 45 MG tablet Take 45 mg by mouth daily.    [provider]  polyethylene glycol (MIRALAX / GLYCOLAX) 17 g packet Take 17 g by mouth 2 (two) times daily. 11/28/20   Calvert Cantor, MD  pravastatin (PRAVACHOL) 40 MG tablet Take 40 mg by mouth at bedtime.     [provider]  QUEtiapine (SEROQUEL) 400 MG tablet Take 600 mg by mouth at bedtime.    [provider]  senna (SENOKOT) 8.6 MG TABS tablet Take 2 tablets (17.2 mg total) by mouth at bedtime as needed for mild constipation (if no BM all day). 11/28/20   Calvert Cantor, MD  sitaGLIPtin (JANUVIA) 100 MG tablet Take 100 mg by mouth daily.    [provider]  sodium phosphate (FLEET) 7-19 GM/118ML ENEM Place 133 mLs (1 enema total) rectally daily as needed for severe constipation (if no BM in 3 days). 11/28/20   Calvert Cantor, MD  SYNJARDY 12.01-999 MG TABS Take 1 tablet by mouth 2 (two) times daily. 06/11/20   [provider]  tiotropium (SPIRIVA) 18 MCG inhalation capsule Place 18 mcg into inhaler and inhale daily.    [provider]  traZODone (DESYREL) 100 MG tablet Take 200 mg by mouth at bedtime.    [provider]  Vitamin D, Ergocalciferol, (DRISDOL) 50000 UNITS CAPS capsule Take 50,000 Units by mouth every 7 (seven) days. Patient not taking: Reported on 11/26/2020    [provider]    Allergies Fish allergy and Shellfish allergy  Family History  Problem Relation Age of Onset  . Diabetes Mother   . Kidney failure Mother   . Diabetes Father   . CAD Brother     Social History Social History   Tobacco Use  . Smoking status: Current Every Day Smoker    Packs/day: 1.00    Types: Cigarettes  . Smokeless tobacco: Never Used  . Tobacco comment: d/t cognitive function  Substance Use Topics  . Alcohol use:  No  . Drug use: No    Review of Systems Constitutional: No fever. Eyes: No visual changes. ENT: No sore throat. Cardiovascular: Denies chest pain. Respiratory: Denies shortness of breath. Gastrointestinal: No nausea, vomiting, diarrhea. Genitourinary: Negative for dysuria. Musculoskeletal: Negative for back pain. Skin: Negative for rash. Neurological: Negative for focal weakness or numbness.  ____________________________________________   PHYSICAL EXAM:  VITAL SIGNS: ED Triage Vitals  Enc Vitals Group     BP 12/06/20 2122 (!) 123/98     Pulse Rate 12/06/20 2122 84     Resp 12/06/20 2122 16     Temp 12/06/20 2122 98.4 F (36.9 C)     Temp Source 12/06/20 2122 Oral     SpO2 12/06/20 2122 99 %     Weight 12/06/20 2119 152 lb 1.9 oz (69 kg)     Height 12/06/20 2119 5\' 9"  (1.753 m)     Head Circumference --      Peak Flow --      Pain Score 12/06/20 2118 10     Pain Loc --      Pain Edu? --      Excl. in GC? --    CONSTITUTIONAL: Alert and oriented and responds appropriately to questions. Well-appearing; well-nourished HEAD:  Normocephalic EYES: Conjunctivae clear, pupils appear equal, EOM appear intact ENT: normal nose; moist mucous membranes NECK: Supple, normal ROM CARD: RRR; S1 and S2 appreciated; no murmurs, no clicks, no rubs, no gallops RESP: Normal chest excursion without splinting or tachypnea; breath sounds clear and equal bilaterally; no wheezes, no rhonchi, no rales, no hypoxia or respiratory distress, speaking full sentences ABD/GI: Normal bowel sounds; non-distended; soft, non-tender, no rebound, no guarding, no peritoneal signs, no hepatosplenomegaly RECTAL:  Normal rectal tone, no gross blood or melena, no hemorrhoids appreciated, nontender rectal exam, patient has a large amount of hard stool in the rectal vault.  Chaperone present. BACK: The back appears normal EXT: Normal ROM in all joints; no deformity noted, no edema; no cyanosis SKIN: Normal color for age and race; warm; no rash on exposed skin NEURO: Moves all extremities equally PSYCH: The patient's mood and manner are appropriate.  ____________________________________________   LABS (all labs ordered are listed, but only abnormal results are displayed)  Labs Reviewed  COMPREHENSIVE METABOLIC PANEL - Abnormal; Notable for the following components:      Result Value   Glucose, Bld 146 (*)    AST 12 (*)    All other components within normal limits  CBC - Abnormal; Notable for the following components:   RBC 4.08 (*)    Hemoglobin 12.1 (*)    HCT 36.8 (*)    RDW 16.4 (*)    All other components within normal limits  URINALYSIS, COMPLETE (UACMP) WITH MICROSCOPIC - Abnormal; Notable for the following components:   Color, Urine YELLOW (*)    APPearance CLEAR (*)    Specific Gravity, Urine 1.032 (*)    Glucose, UA >=500 (*)    Ketones, ur 5 (*)    Bacteria, UA RARE (*)    All other components within normal limits  LIPASE, BLOOD    ____________________________________________  EKG  None ____________________________________________  RADIOLOGY I, [facility], personally viewed and evaluated these images (plain radiographs) as part of my medical decision making, as well as reviewing the written report by the radiologist.  ED MD interpretation: Constipation.  Official radiology report(s): DG Abdomen Acute W/Chest  Result Date: 12/07/2020 CLINICAL DATA:  Bloating, constipation EXAM: DG ABDOMEN ACUTE  WITH 1 VIEW CHEST COMPARISON:  11/27/2020 FINDINGS: There is progressive focal pulmonary infiltrate within the right lung base. This reflected an area of a rounded atelectasis seen on prior CT examination of 11/11/2020, however, increasing opacity may reflect a superimposed infectious infiltrate in the appropriate clinical setting. Left lung is clear. No pneumothorax. Stable tiny right pleural effusion or pleural scarring. Large stool seen throughout the colon. No evidence of obstruction. No free intraperitoneal gas. No organomegaly. Osseous structures are unremarkable. Cholecystectomy clips seen in the right upper quadrant. IMPRESSION: Progressive opacity at the right lung base possible setting a developing focal pulmonary infiltrate superimposed upon rounded atelectasis noted on prior CT examination. Large volume stool throughout the colon, similar to prior examination. No obstruction. No free air. Electronically Signed   By: Helyn Numbers MD   On: 12/07/2020 01:23    ____________________________________________   PROCEDURES  Procedure(s) performed (including Critical Care):  Procedures   ____________________________________________   INITIAL IMPRESSION / ASSESSMENT AND PLAN / ED COURSE  As part of my medical decision making, I reviewed the following data within the electronic MEDICAL RECORD NUMBER History obtained from family, Nursing notes reviewed and incorporated, Labs reviewed , Old chart reviewed and Notes from  prior ED visits         Patient here with constipation.  Labs, urine unremarkable.  X-ray shows large amount of stool throughout the colon without obstruction or free air.  His abdominal exam here is benign.  He is not vomiting and is tolerating p.o.  He initially adamantly refuses rectal exam but then allows me to perform rectal exam.  He does have a large amount of stool in the rectal vault but refuses enema, disimpaction.  Unable to persuade him otherwise.  He was given lactulose here.  Will discharge home with lactulose, stool softeners.  I do not feel at this time he needs admission to the hospital.  On his x-ray they do comment that there is a progressive opacity the right lung base that was also seen on CT imaging on March 3.  He has no fever, cough, chest pain or shortness of breath.  No leukocytosis and is afebrile here.  I do not think this represents infection.  Previous CT scan commented that this was stable moderate severity right basilar scarring and/or rounded atelectasis with chronic changes.  I feel he is safe to be discharged back to his facility with his caregiver.  At this time, I do not feel there is any life-threatening condition present. I have reviewed, interpreted and discussed all results (EKG, imaging, lab, urine as appropriate) and exam findings with patient/family. I have reviewed nursing notes and appropriate previous records.  I feel the patient is safe to be discharged home without further emergent workup and can continue workup as an outpatient as needed. Discussed usual and customary return precautions. Patient/family verbalize understanding and are comfortable with this plan.  Outpatient follow-up has been provided as needed. All questions have been answered.  ____________________________________________   FINAL CLINICAL IMPRESSION(S) / ED DIAGNOSES  Final diagnoses:  Constipation, unspecified constipation type     ED Discharge Orders         Ordered     Lactulose 20 GM/30ML SOLN  2 times daily        12/07/20 0249    docusate sodium (COLACE) 100 MG capsule  2 times daily        12/07/20 0249          *Please note:  Dayton Scrape  L Kornegay was evaluated in Emergency Department on 12/07/2020 for the symptoms described in the history of present illness. He was evaluated in the context of the global COVID-19 pandemic, which necessitated consideration that the patient might be at risk for infection with the SARS-CoV-2 virus that causes COVID-19. Institutional protocols and algorithms that pertain to the evaluation of patients at risk for COVID-19 are in a state of rapid change based on information released by regulatory bodies including the CDC and federal and state organizations. These policies and algorithms were followed during the patient's care in the ED.  Some ED evaluations and interventions may be delayed as a result of limited staffing during and the pandemic.*   Note:  This document was prepared using Dragon voice recognition software and may include unintentional dictation errors.   Chara Marquard, Layla Maw, DO 12/07/20 [phone]

## 2021-01-06 ENCOUNTER — Ambulatory Visit: Payer: Medicare Other | Admitting: Gastroenterology

## 2021-08-09 ENCOUNTER — Emergency Department (HOSPITAL_COMMUNITY): Payer: Medicare Other

## 2021-08-09 ENCOUNTER — Encounter (HOSPITAL_COMMUNITY): Payer: Self-pay | Admitting: *Deleted

## 2021-08-09 ENCOUNTER — Emergency Department (HOSPITAL_COMMUNITY)
Admission: EM | Admit: 2021-08-09 | Discharge: 2021-08-09 | Disposition: A | Discharge status: Home / Self Care | Payer: Medicare Other | Attending: Emergency Medicine | Admitting: Emergency Medicine

## 2021-08-09 DIAGNOSIS — Z79899 Other long term (current) drug therapy: Secondary | ICD-10-CM | POA: Insufficient documentation

## 2021-08-09 DIAGNOSIS — R112 Nausea with vomiting, unspecified: Secondary | ICD-10-CM

## 2021-08-09 DIAGNOSIS — E1169 Type 2 diabetes mellitus with other specified complication: Secondary | ICD-10-CM | POA: Diagnosis not present

## 2021-08-09 DIAGNOSIS — K219 Gastro-esophageal reflux disease without esophagitis: Secondary | ICD-10-CM | POA: Diagnosis not present

## 2021-08-09 DIAGNOSIS — E785 Hyperlipidemia, unspecified: Secondary | ICD-10-CM | POA: Diagnosis not present

## 2021-08-09 DIAGNOSIS — I10 Essential (primary) hypertension: Secondary | ICD-10-CM | POA: Insufficient documentation

## 2021-08-09 DIAGNOSIS — F17210 Nicotine dependence, cigarettes, uncomplicated: Secondary | ICD-10-CM | POA: Insufficient documentation

## 2021-08-09 DIAGNOSIS — K5900 Constipation, unspecified: Secondary | ICD-10-CM | POA: Insufficient documentation

## 2021-08-09 DIAGNOSIS — R1011 Right upper quadrant pain: Secondary | ICD-10-CM

## 2021-08-09 DIAGNOSIS — Z794 Long term (current) use of insulin: Secondary | ICD-10-CM | POA: Diagnosis not present

## 2021-08-09 DIAGNOSIS — Z9049 Acquired absence of other specified parts of digestive tract: Secondary | ICD-10-CM | POA: Diagnosis not present

## 2021-08-09 DIAGNOSIS — R109 Unspecified abdominal pain: Secondary | ICD-10-CM | POA: Diagnosis present

## 2021-08-09 LAB — COMPREHENSIVE METABOLIC PANEL
ALT: 13 U/L (ref 0–44)
AST: 16 U/L (ref 15–41)
Albumin: 4.2 g/dL (ref 3.5–5.0)
Alkaline Phosphatase: 131 U/L — ABNORMAL HIGH (ref 38–126)
Anion gap: 11 (ref 5–15)
BUN: 26 mg/dL — ABNORMAL HIGH (ref 8–23)
CO2: 29 mmol/L (ref 22–32)
Calcium: 9.5 mg/dL (ref 8.9–10.3)
Chloride: 99 mmol/L (ref 98–111)
Creatinine, Ser: 1.18 mg/dL (ref 0.61–1.24)
GFR, Estimated: >60 mL/min (ref >60)
Glucose, Bld: 116 mg/dL — ABNORMAL HIGH (ref 70–99)
Potassium: 4.3 mmol/L (ref 3.5–5.1)
Sodium: 139 mmol/L (ref 135–145)
Total Bilirubin: 0.6 mg/dL (ref 0.3–1.2)
Total Protein: 8.2 g/dL — ABNORMAL HIGH (ref 6.5–8.1)

## 2021-08-09 LAB — CBC
HCT: 53.8 % — ABNORMAL HIGH (ref 39.0–52.0)
Hemoglobin: 17 g/dL (ref 13.0–17.0)
MCH: 27.9 pg (ref 26.0–34.0)
MCHC: 31.6 g/dL (ref 30.0–36.0)
MCV: 88.2 fL (ref 80.0–100.0)
Platelets: 211 10*3/uL (ref 150–400)
RBC: 6.1 MIL/uL — ABNORMAL HIGH (ref 4.22–5.81)
RDW: 17.3 % — ABNORMAL HIGH (ref 11.5–15.5)
WBC: 15.8 10*3/uL — ABNORMAL HIGH (ref 4.0–10.5)
nRBC: 0 % (ref 0.0–0.2)

## 2021-08-09 LAB — LIPASE, BLOOD: Lipase: 25 U/L (ref 11–51)

## 2021-08-09 MED ORDER — BISACODYL 5 MG PO TBEC: 5.0000 mg | DELAYED_RELEASE_TABLET | Freq: Two times a day (BID) | ORAL | 0 refills | Status: AC

## 2021-08-09 MED ORDER — METOCLOPRAMIDE HCL 5 MG/ML IJ SOLN
10.0000 mg | Freq: Once | INTRAMUSCULAR | Status: AC
  Administered 2021-08-09: 10 mg via INTRAVENOUS

## 2021-08-09 MED ORDER — METOCLOPRAMIDE HCL 10 MG PO TABS: 10.0000 mg | ORAL_TABLET | Freq: Four times a day (QID) | ORAL | 0 refills | Status: AC | PRN

## 2021-08-09 MED ORDER — SODIUM CHLORIDE 0.9 % IV BOLUS
1000.0000 mL | Freq: Once | INTRAVENOUS | Status: AC
  Administered 2021-08-09: 1000 mL via INTRAVENOUS

## 2021-08-09 MED ORDER — FENTANYL CITRATE PF 50 MCG/ML IJ SOSY
50.0000 ug | PREFILLED_SYRINGE | Freq: Once | INTRAMUSCULAR | Status: AC
  Administered 2021-08-09: 50 ug via INTRAVENOUS

## 2021-08-09 NOTE — ED Notes (Signed)
Patient transported to X-ray 

## 2021-08-09 NOTE — ED Triage Notes (Signed)
Vomiting with chest pain for over a week

## 2021-08-09 NOTE — Discharge Instructions (Signed)

## 2021-08-09 NOTE — ED Provider Notes (Signed)
[facility] EMERGENCY DEPARTMENT Provider Note   CSN: [id number] Arrival date & time: 08/09/21  1033     History Chief Complaint  Patient presents with   Abdominal Pain    Johnny [name] is a 65 y.o. male with a past medical history of diabetes, GERD, hyperlipidemia, hypertension, schizophrenia, intractable vomiting, MI, slow transit constipation, GI bleed who presents emergency department with chief complaint of abdominal pain and vomiting.  Patient's legal guardian is at bedside.  The patient is an unreliable historian.  His legal guardian is helpful but also has poor insight.  The patient has had 2 days of vomiting.  Prior to the vomiting he had about 2 days of diarrhea.  He states that he has not been able to pass gas or make a bowel movement since that time.  His guardian states that he has been unable to hold anything down by mouth.  He has had multiple episodes of vomiting daily for the past 2 days and they have noted some brown emesis.  The patient has not tried anything for pain.  He rates it at 10 out of 10.  He does not take NSAIDs or Tylenol regularly.  He complains of pain in his lower abdomen.  He currently feels nauseous but has not vomited since earlier this morning.  He has not had fever or cough.  Patient recently had a diagnosis of right middle lobe pneumonia and pleural effusion for which she was treated with 10 days of antibiotics.  His diarrhea and vomiting started just after finishing the course of antibiotics.   Abdominal Pain     Past Medical History:  Diagnosis Date   Anemia    Clostridium difficile colitis    Diabetes mellitus without complication ([place])    DM (diabetes mellitus) ([place])    GERD (gastroesophageal reflux disease)    Herpes    Hyperlipidemia    Hypertension    Schizophrenia ([place])     Patient Active Problem List   Diagnosis Date Noted   Malnutrition of moderate degree 11/28/2020   Constipation due to slow transit 11/28/2020   Hypotension  due to hypovolemia 11/28/2020   Dehydration, severe 11/28/2020   Intractable vomiting with nausea 11/26/2020   Dehydration 11/25/2020   Esophagitis, [place] grade D 11/25/2020   GIB (gastrointestinal bleeding) 11/12/2020   Hematemesis with nausea    Abnormal CT scan, esophagus    Nausea    Uncontrolled type 2 diabetes mellitus 11/11/2020   Bacteremia 06/17/2020   Acute renal failure (ARF) ([place]) 07/05/2015   SINUS TACHYCARDIA 06/10/2008   HYPOTENSION, ORTHOSTATIC 06/10/2008   Type 2 diabetes mellitus with hyperlipidemia ([place]) 05/19/2008   HYPOGLYCEMIA 04/27/2008   HYPERLIPIDEMIA 01/22/2008   Schizophrenia ([place]) 01/22/2008   ANXIETY 01/22/2008   DEPRESSION 01/22/2008   Essential hypertension 01/22/2008   MYOCARDIAL INFARCTION, HX OF 01/22/2008   BRONCHITIS, CHRONIC 01/22/2008   GERD 01/22/2008    Past Surgical History:  Procedure Laterality Date   CHOLECYSTECTOMY     ESOPHAGOGASTRODUODENOSCOPY (EGD) WITH PROPOFOL N/A 11/12/2020   Procedure: ESOPHAGOGASTRODUODENOSCOPY (EGD) WITH PROPOFOL;  Surgeon: Lucilla Lame, MD;  Location: [facility] ENDOSCOPY;  Service: Endoscopy;  Laterality: N/A;       Family History  Problem Relation Age of Onset   Diabetes Mother    Kidney failure Mother    Diabetes Father    CAD Brother     Social History   Tobacco Use   Smoking status: Every Day    Packs/day: 1.00  Types: Cigarettes   Smokeless tobacco: Never   Tobacco comments:    d/t cognitive function  Substance Use Topics   Alcohol use: No   Drug use: No    Home Medications Prior to Admission medications   Medication Sig Start Date End Date Taking? Authorizing Provider  bisacodyl (DULCOLAX) 5 MG EC tablet Take 1 tablet (5 mg total) by mouth 2 (two) times daily. 08/09/21  Yes Arron Tetrault, PA-C  metoCLOPramide (REGLAN) 10 MG tablet Take 1 tablet (10 mg total) by mouth every 6 (six) hours as needed for nausea (nausea/headache). 08/09/21  Yes Paislynn Hegstrom, PA-C  albuterol  (PROVENTIL HFA;VENTOLIN HFA) 108 (90 BASE) MCG/ACT inhaler Inhale 2 puffs into the lungs every 4 (four) hours as needed for wheezing or shortness of breath.    [provider]  ammonium lactate (AMLACTIN) 12 % cream Apply 1 g topically daily.     [provider]  benazepril (LOTENSIN) 20 MG tablet Take 20 mg by mouth daily. 06/11/20   [provider]  benztropine (COGENTIN) 1 MG tablet Take 1 mg by mouth 2 (two) times daily.    [provider]  bisacodyl (DULCOLAX) 10 MG suppository Place 1 suppository (10 mg total) rectally daily as needed for moderate constipation (if no BM in 2 days). 11/28/20   Debbe Odea, MD  BREZTRI AEROSPHERE 160-9-4.8 MCG/ACT AERO Inhale 2 puffs into the lungs 2 (two) times daily. 05/14/20   [provider]  buPROPion (WELLBUTRIN XL) 300 MG 24 hr tablet Take 300 mg by mouth daily. 06/11/20   [provider]  Canagliflozin-metFORMIN HCl 50-1000 MG TABS Take 1 tablet by mouth 2 (two) times daily with a meal.    [provider]  clonazePAM (KLONOPIN) 1 MG tablet Take 1 mg by mouth 3 (three) times daily.    [provider]  colesevelam (WELCHOL) 625 MG tablet Take 1,875 mg by mouth 2 (two) times daily.     [provider]  divalproex (DEPAKOTE ER) 500 MG 24 hr tablet Take 500 mg by mouth 2 (two) times daily.     [provider]  docusate sodium (COLACE) 100 MG capsule Take 1 capsule (100 mg total) by mouth 2 (two) times daily. 12/07/20 12/07/21  Ward, Delice Bison, DO  fluPHENAZine (PROLIXIN) 5 MG tablet Take 5 mg by mouth 2 (two) times daily. Pt also takes daily as needed for psychosis/agitation.    [provider]  insulin glargine (LANTUS) 100 UNIT/ML injection Inject 30 Units into the skin at bedtime.    [provider]  INVEGA TRINZA 819 MG/2.625ML injection Inject 819 mg into the muscle every 30 (thirty) days. 06/01/20   [provider]  Memantine HCl-Donepezil HCl  28-10 MG CP24 Take 1 capsule by mouth at bedtime.     [provider]  pantoprazole (PROTONIX) 40 MG tablet Take 1 tablet (40 mg total) by mouth 2 (two) times daily. 11/13/20 12/13/20  Nolberto Hanlon, MD  pioglitazone (ACTOS) 45 MG tablet Take 45 mg by mouth daily.    [provider]  polyethylene glycol (MIRALAX / GLYCOLAX) 17 g packet Take 17 g by mouth 2 (two) times daily. 11/28/20   Debbe Odea, MD  pravastatin (PRAVACHOL) 40 MG tablet Take 40 mg by mouth at bedtime.     [provider]  QUEtiapine (SEROQUEL) 400 MG tablet Take 600 mg by mouth at bedtime.    [provider]  senna (SENOKOT) 8.6 MG TABS tablet Take 2 tablets (17.2  mg total) by mouth at bedtime as needed for mild constipation (if no BM all day). 11/28/20   Calvert Cantor, MD  sitaGLIPtin (JANUVIA) 100 MG tablet Take 100 mg by mouth daily.    [provider]  sodium phosphate (FLEET) 7-19 GM/118ML ENEM Place 133 mLs (1 enema total) rectally daily as needed for severe constipation (if no BM in 3 days). 11/28/20   Calvert Cantor, MD  SYNJARDY 12.01-999 MG TABS Take 1 tablet by mouth 2 (two) times daily. 06/11/20   [provider]  tiotropium (SPIRIVA) 18 MCG inhalation capsule Place 18 mcg into inhaler and inhale daily.    [provider]  traZODone (DESYREL) 100 MG tablet Take 200 mg by mouth at bedtime.    [provider]  Vitamin D, Ergocalciferol, (DRISDOL) 50000 UNITS CAPS capsule Take 50,000 Units by mouth every 7 (seven) days. Patient not taking: Reported on 11/26/2020    [provider]    Allergies    Fish allergy and Shellfish allergy  Review of Systems   Review of Systems  Gastrointestinal:  Positive for abdominal pain.  Ten systems reviewed and are negative for acute change, except as noted in the HPI.   Physical Exam Updated Vital Signs BP (!) 178/96   Pulse 79   Temp 98.2 F (36.8 C) (Oral)   Resp 16   Ht 5\' 9"  (1.753 m)   Wt 86.3 kg    SpO2 96%   BMI 28.10 kg/m   Physical Exam Vitals and nursing note reviewed.  Constitutional:      General: He is not in acute distress.    Appearance: He is well-developed. He is not diaphoretic.  HENT:     Head: Normocephalic and atraumatic.  Eyes:     General: No scleral icterus.    Conjunctiva/sclera: Conjunctivae normal.  Cardiovascular:     Rate and Rhythm: Normal rate and regular rhythm.     Heart sounds: Normal heart sounds.  Pulmonary:     Effort: Pulmonary effort is normal. No respiratory distress.     Breath sounds: Normal breath sounds.  Abdominal:     General: Bowel sounds are normal. There is distension.     Palpations: Abdomen is soft.     Tenderness: There is no abdominal tenderness. There is no guarding or rebound.     Hernia: No hernia is present.  Musculoskeletal:     Cervical back: Normal range of motion and neck supple.  Skin:    General: Skin is warm and dry.  Neurological:     Mental Status: He is alert.  Psychiatric:        Behavior: Behavior normal.    ED Results / Procedures / Treatments   Labs (all labs ordered are listed, but only abnormal results are displayed) Labs Reviewed  COMPREHENSIVE METABOLIC PANEL - Abnormal; Notable for the following components:      Result Value   Glucose, Bld 116 (*)    BUN 26 (*)    Total Protein 8.2 (*)    Alkaline Phosphatase 131 (*)    All other components within normal limits  CBC - Abnormal; Notable for the following components:   WBC 15.8 (*)    RBC 6.10 (*)    HCT 53.8 (*)    RDW 17.3 (*)    All other components within normal limits  LIPASE, BLOOD  URINALYSIS, ROUTINE W REFLEX MICROSCOPIC    EKG None  Radiology DG ABD ACUTE 2+V W 1V CHEST  Result  Date: 08/09/2021 CLINICAL DATA:  Abdominal pain vomiting and diarrhea in a 65 year old male. EXAM: DG ABDOMEN ACUTE WITH 1 VIEW CHEST COMPARISON:  December 07, 2020. FINDINGS: Ovoid opacity in the RIGHT mid chest more conspicuous than on previous  imaging. EKG leads project over the chest. No signs of new area of consolidation. No pneumothorax. Cardiomediastinal contours and hilar structures are stable. No free air beneath either the RIGHT or LEFT hemidiaphragm. Surgical clips from prior cholecystectomy are present over the RIGHT upper quadrant. Stool is seen throughout the colon. Moderate to marked amount of stool without signs of small-bowel obstruction. Rectal gas is present. On limited assessment there is no acute regional skeletal process. IMPRESSION: Ovoid opacity in the RIGHT mid chest more conspicuous than on previous imaging. Morphology on the previous CT suggested rounded atelectasis. Perhaps there is superimposed infection. Consider 6-8 week follow-up with PA and lateral chest or chest CT for further evaluation to assess for return to baseline and exclude underlying mass. Scout images from November 11, 2020 show very little opacity in this area. Signs of constipation. Electronically Signed   By: Zetta Bills M.D.   On: 08/09/2021 13:57   US ABDOMEN LIMITED RUQ (LIVER/GB)  Result Date: 08/09/2021 CLINICAL DATA:  Right upper quadrant pain, history of cholecystectomy. Vomiting for 1 week EXAM: ULTRASOUND ABDOMEN LIMITED RIGHT UPPER QUADRANT COMPARISON:  CT abdomen/pelvis 11/15/2010 and 01/11/2021, abdominal ultrasound 11/08/2010 FINDINGS: Gallbladder: The gallbladder is surgically absent. Common bile duct: Diameter: 5 mm Liver: No focal lesion identified. Within normal limits in parenchymal echogenicity. Portal vein is patent on color Doppler imaging with normal direction of blood flow towards the liver. Other: None. IMPRESSION: Status post cholecystectomy. Otherwise, unremarkable right upper quadrant ultrasound. Electronically Signed   By: Valetta Mole M.D.   On: 08/09/2021 14:58    Procedures Procedures   Medications Ordered in ED Medications  sodium chloride 0.9 % bolus 1,000 mL (0 mLs Intravenous Stopped 08/09/21 1540)  metoCLOPramide  (REGLAN) injection 10 mg (10 mg Intravenous Given 08/09/21 1403)  fentaNYL (SUBLIMAZE) injection 50 mcg (50 mcg Intravenous Given 08/09/21 1403)  metoCLOPramide (REGLAN) injection 10 mg (10 mg Intravenous Given 08/09/21 1659)    ED Course  I have reviewed the triage vital signs and the nursing notes.  Pertinent labs & imaging results that were available during my care of the patient were reviewed by me and considered in my medical decision making (see chart for details).    65 year old male who presents emergency department chief complaint of vomiting.  He has a history of recurrent vomiting. The emergent differential diagnosis for vomiting includes, but is not limited to differential diagnosis includes bowel obstruction, diverticulitis, gastritis, influenza.  I ordered and reviewed labs that included CBC which shows elevated white blood cell count, CMP with mild elevated blood glucose and BUN, lipase within normal limits.  On physical exam the patient has a very benign abdominal exam with no tenderness or guarding. He has normal bowel sounds. I ordered and reviewed a plain film of the abdomen which shows a large stool burden, no evidence of bowel obstruction, ultrasound of the right upper quadrant also shows no evidence of abnormality and he is status post cholecystectomy.  Given fluids, pain medication, Reglan and has had no episodes of vomiting here in the emergency department. EKG shows sinus tachycardia at a rate of 113.  Patient's tachycardia has resolved after fluids, pain medications. Reevaluation of the patient abdomen shows that he remains nontender, nondistended.  Patient will be discharged  with Reglan and oral dulcolax           Final Clinical Impression(s) / ED Diagnoses Final diagnoses:  RUQ abdominal pain  Nausea and vomiting, unspecified vomiting type  Constipation, unspecified constipation type    Rx / DC Orders ED Discharge Orders          Ordered     metoCLOPramide (REGLAN) 10 MG tablet  Every 6 hours PRN        08/09/21 1634    bisacodyl (DULCOLAX) 5 MG EC tablet  2 times daily        08/09/21 1634             Margarita Mail, PA-C 08/09/21 1740    Lucrezia Starch, MD 08/09/21 2011

## 2022-06-17 IMAGING — CR DG CHEST 2V
1 series · 2 of 2 positions shown · non-contrast
Comparison: 06/17/2020

CLINICAL DATA: Midsternal chest pain and shortness of breath,
belching

EXAM:
CHEST - 2 VIEW

[Series 1: dg chest 2 view · 0.14mm/px · 2 of 2 slices shown]
[im 1/2]
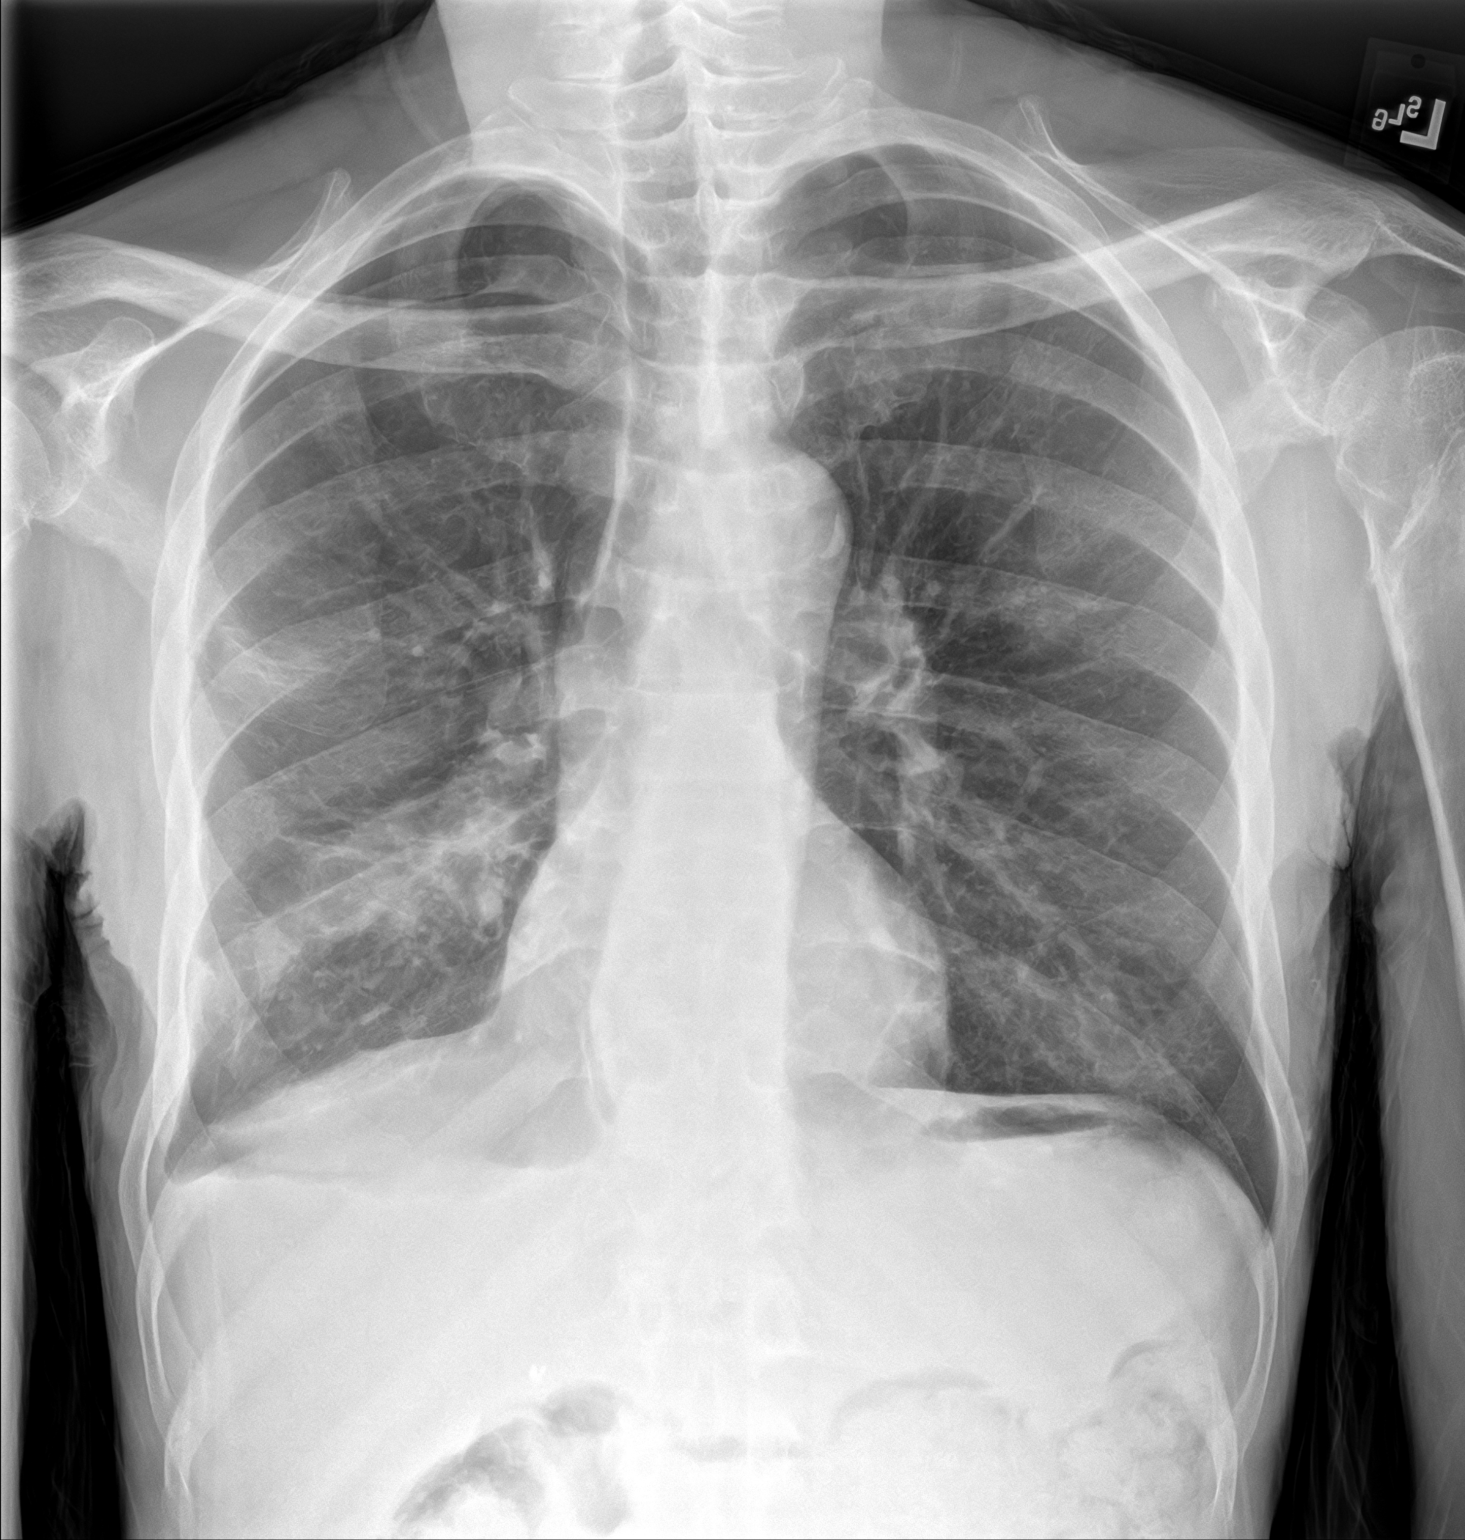
[im 2/2]
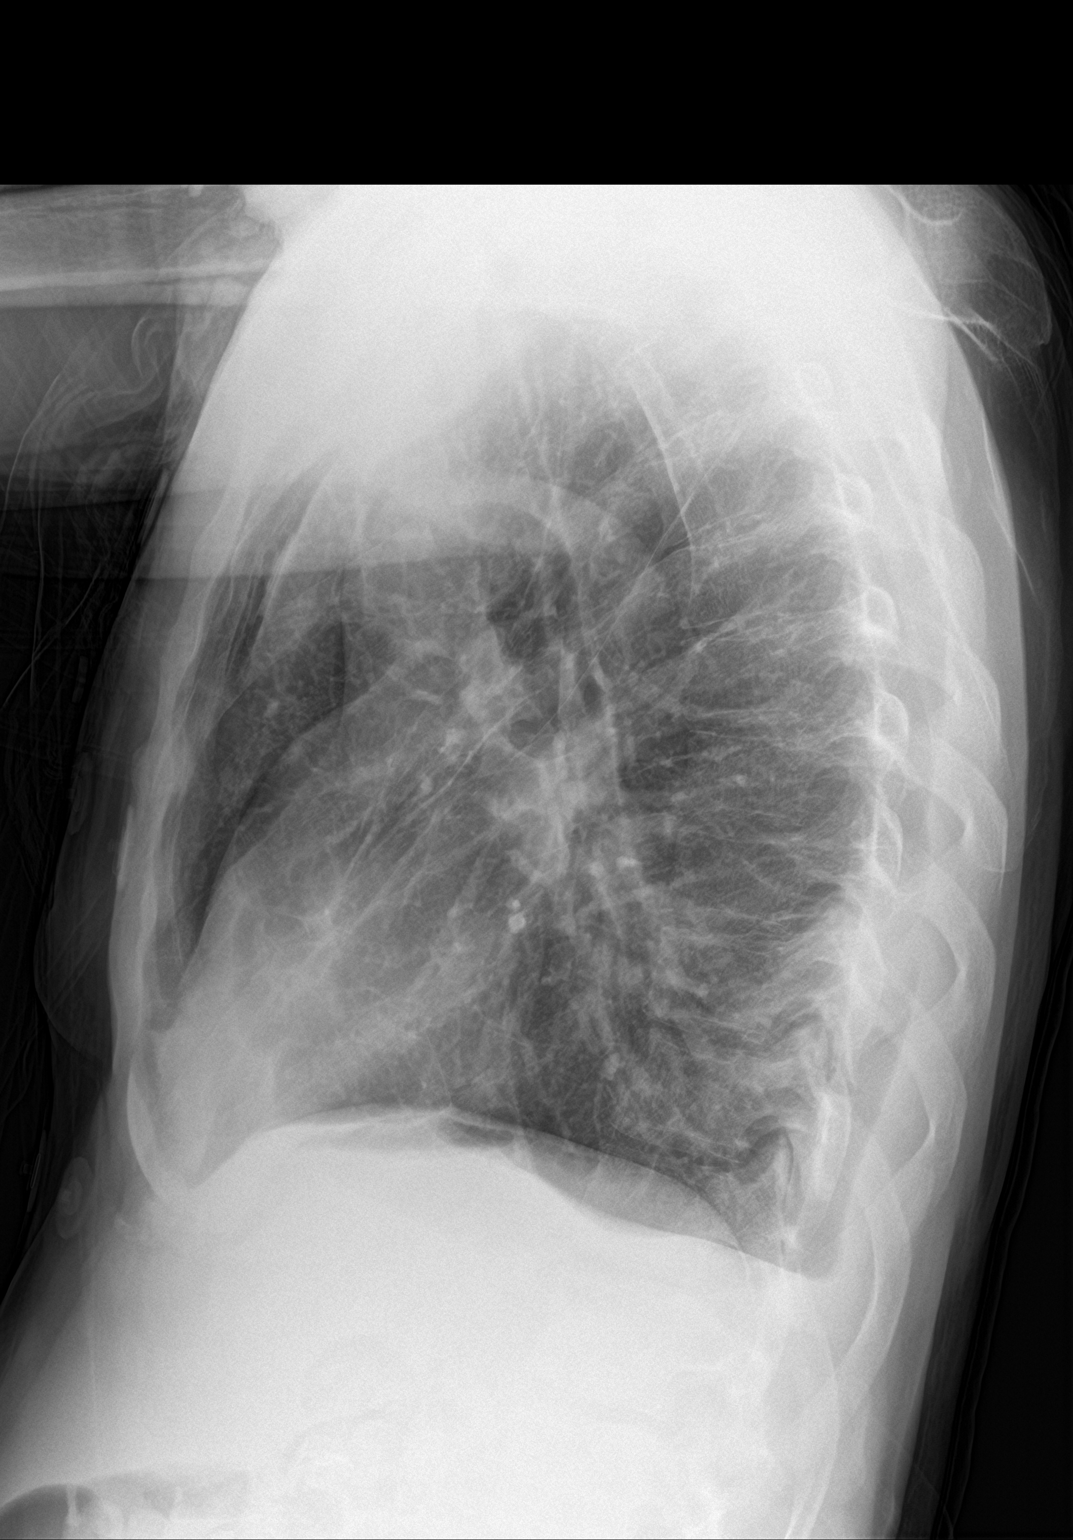

[2 of 2 positions shown; findings below may reference images not displayed]

FINDINGS: Frontal and lateral views of the chest demonstrate a stable cardiac
silhouette. Chronic areas of scarring and atelectasis are seen,
greatest at the right lung base. Trace right pleural effusion versus
pleural thickening again noted, stable. No acute airspace disease.
No pneumothorax. No acute bony abnormalities.
IMPRESSION: 1. Chronic areas of scarring and atelectasis, greatest at the right
lung base.
2. Trace right pleural effusion versus pleural thickening, stable.
3. No acute intrathoracic process.

## 2022-07-02 IMAGING — DX DG ABDOMEN 1V
1 series · 1 of 1 positions shown · non-contrast
Comparison: August 02, 2011

CLINICAL DATA: Not tolerating oral liquids.

EXAM:
ABDOMEN - 1 VIEW

[abdomen supine]
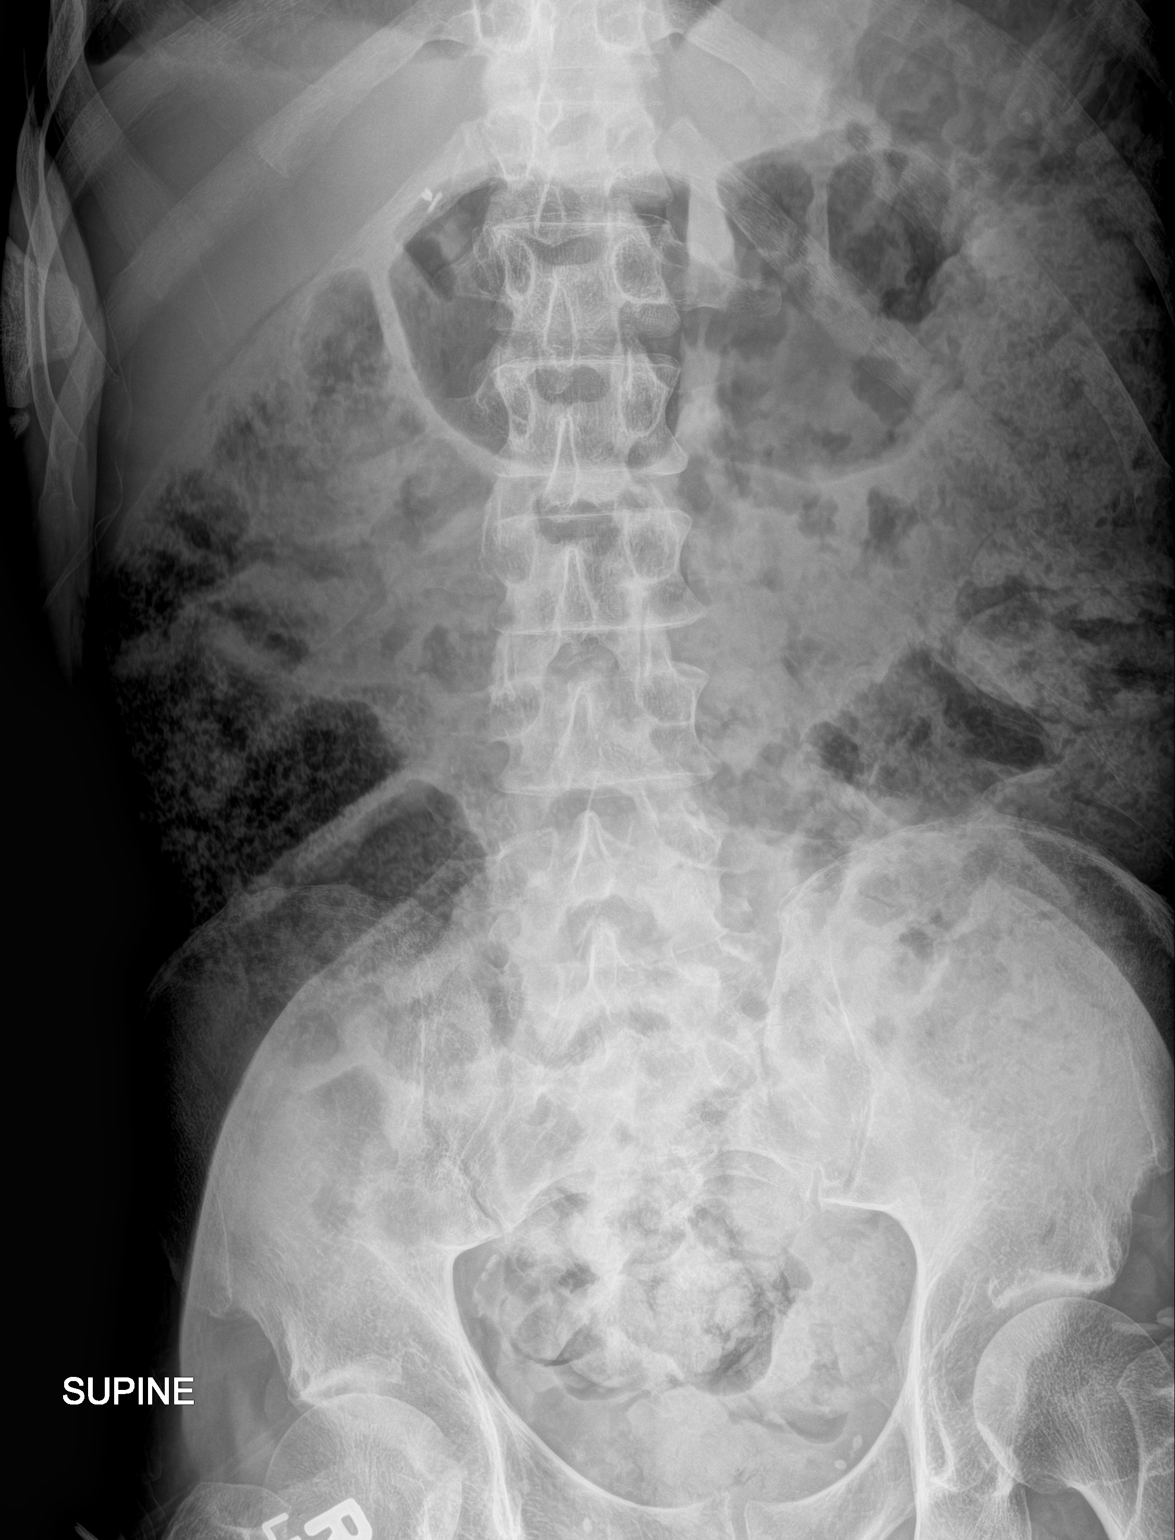

[1 of 1 positions shown; findings below may reference images not displayed]

FINDINGS: The bowel gas pattern is normal. A very large amount of stool is
seen throughout the colon. Radiopaque surgical clips are seen within
the right upper quadrant. No radio-opaque calculi or other
significant radiographic abnormality are seen.
IMPRESSION: Very large stool burden without evidence of bowel obstruction.

## 2022-07-04 IMAGING — DX DG ABD PORTABLE 2V
2 series · 2 of 2 positions shown · non-contrast
Comparison: Multiple exams, including 11/25/2020

CLINICAL DATA: Abdominal pain and distension

EXAM:
PORTABLE ABDOMEN - 2 VIEW

[abdomen erect]
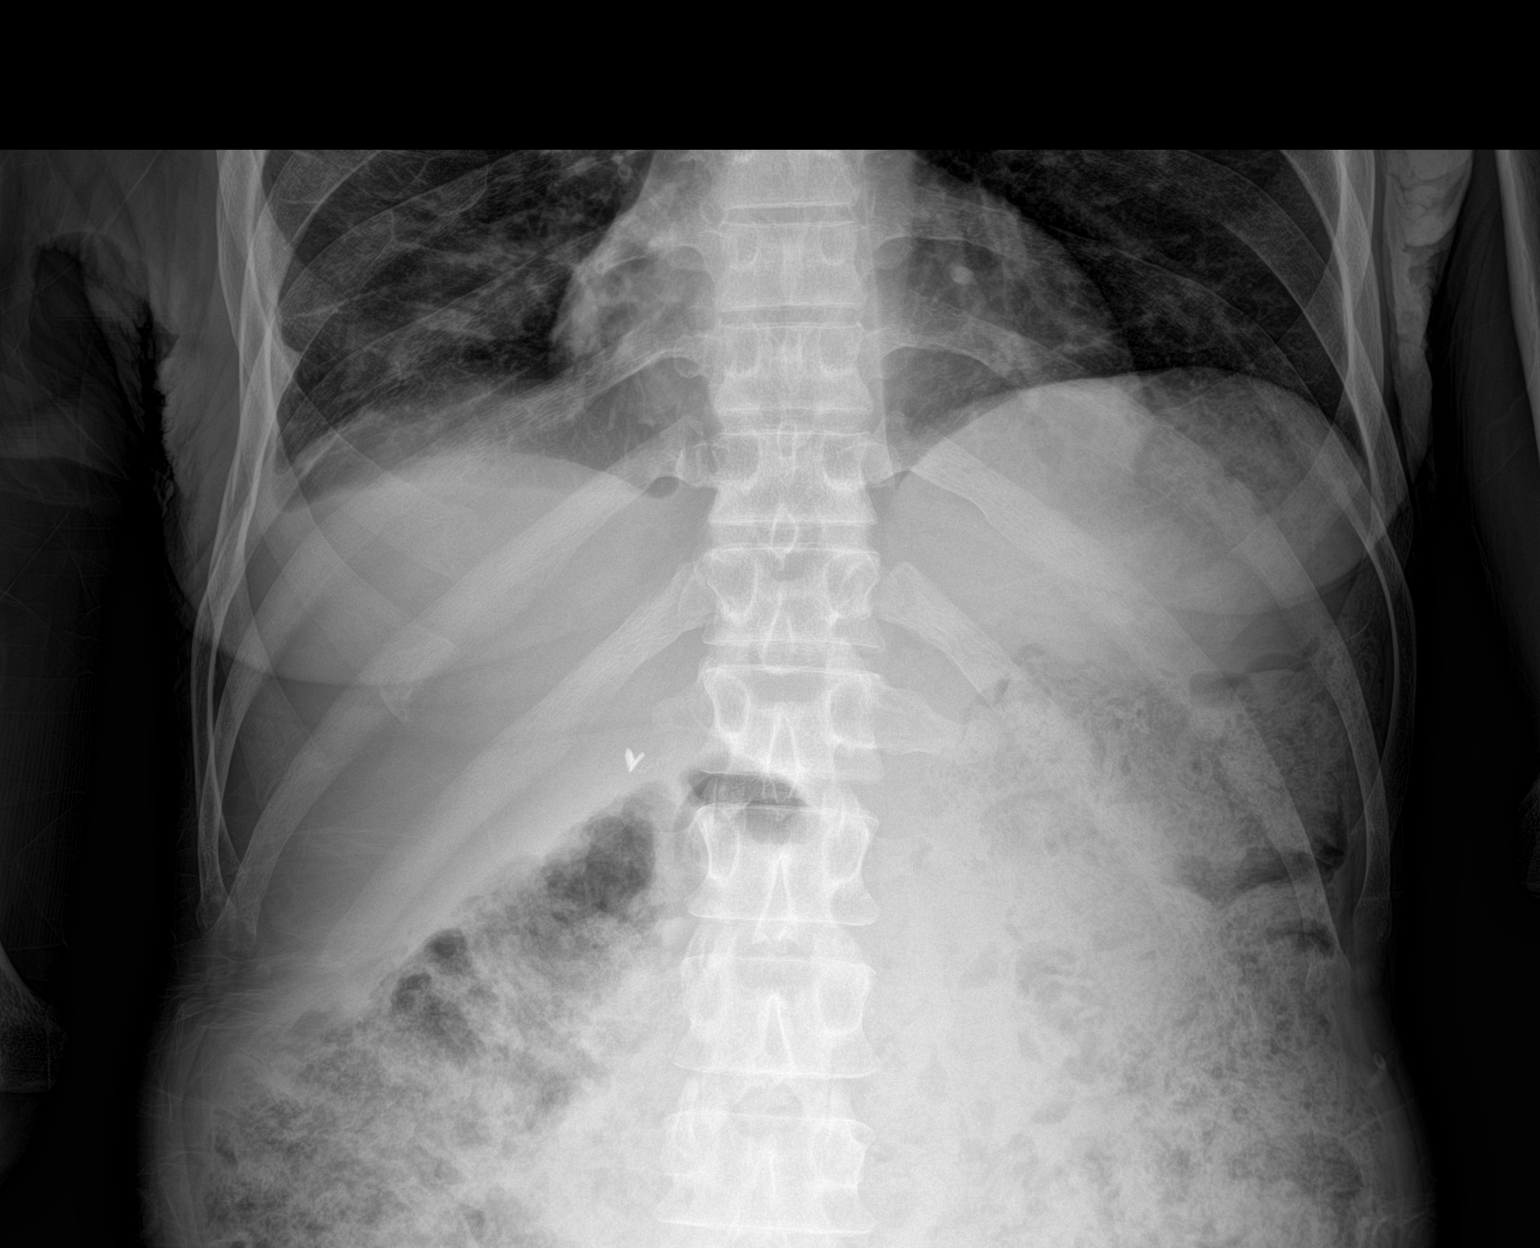

[abdomen supine]
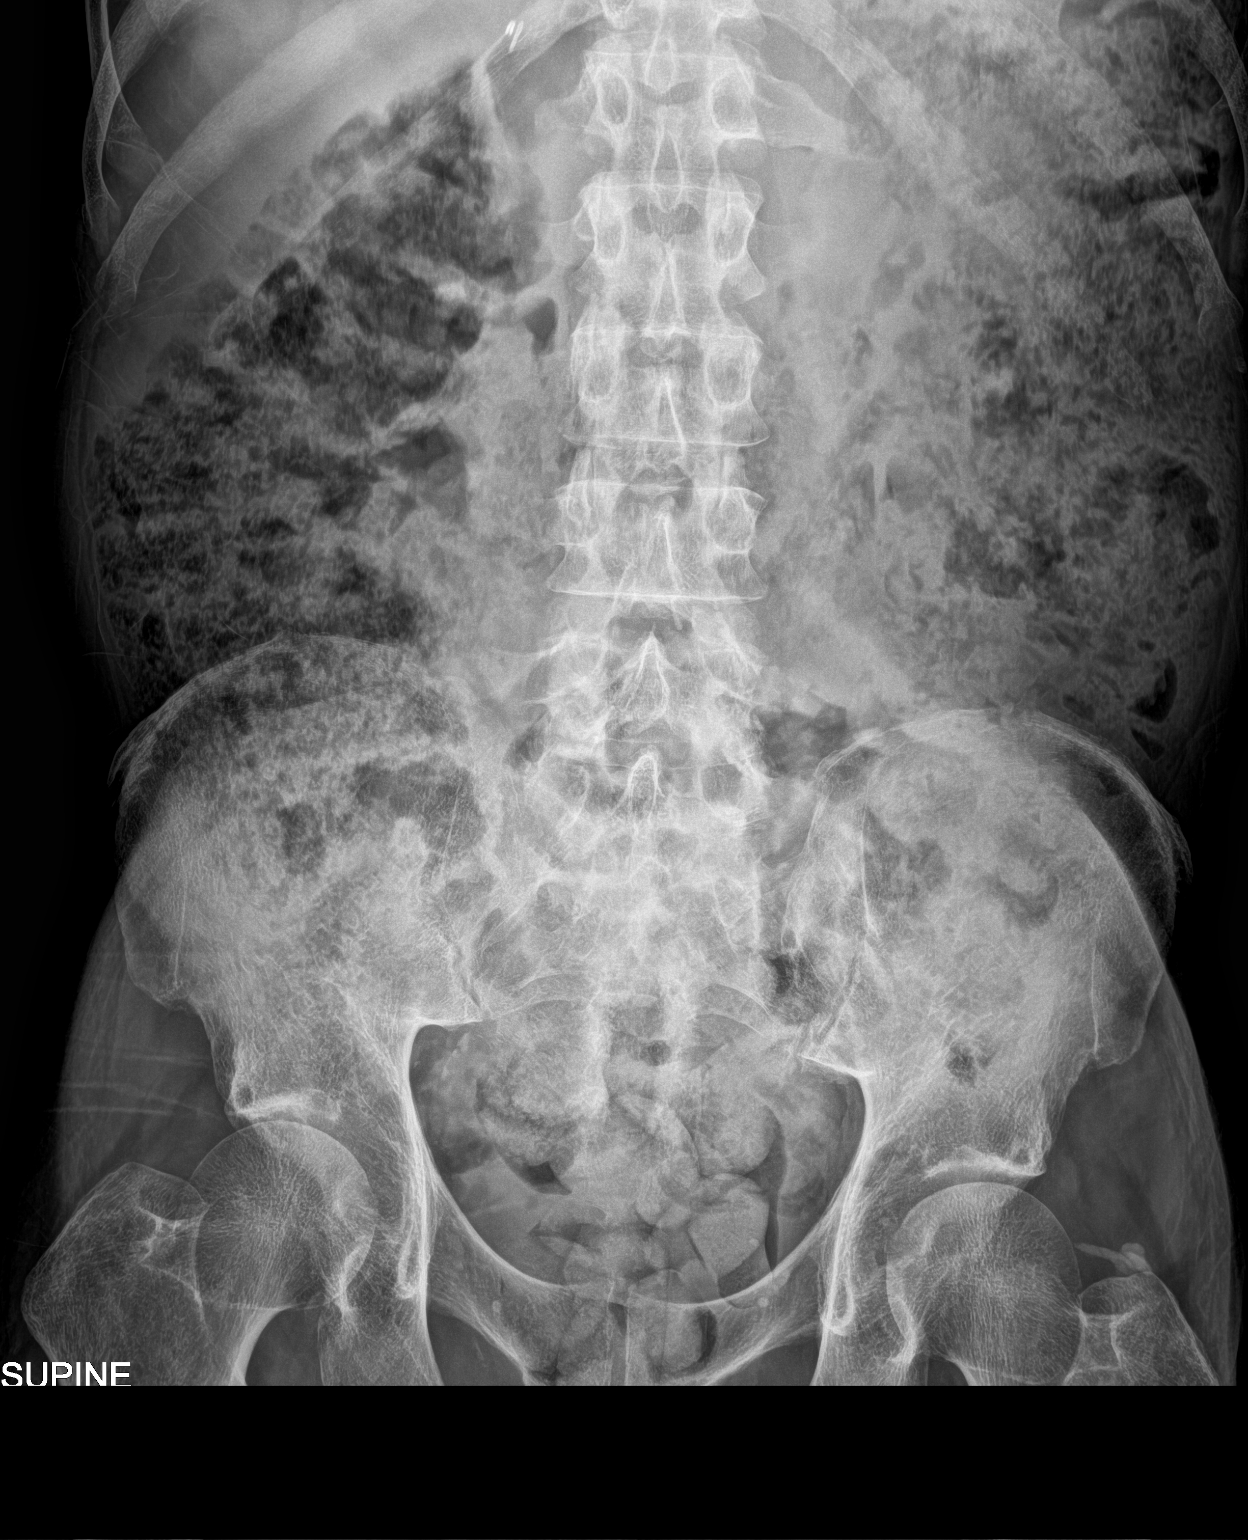

[2 of 2 positions shown; findings below may reference images not displayed]

FINDINGS: Chronic bandlike and rounded densities at the right lung base
similar to 11/25/2020. Much of this may be from scarring. Mildly
blunted right lateral costophrenic angle.

Marked prominence of stool throughout the colon and rectum, similar
to prior. No obvious dilated small bowel. No appreciable air-fluid
levels.
IMPRESSION: 1. Marked prominence of stool throughout the colon and rectum,
similar to prior. Correlate with fecal impaction/constipation.
2. Chronic bandlike and rounded densities at the right lung base,
favoring scarring. Suspected trace right pleural effusion.

## 2024-05-09 ENCOUNTER — Ambulatory Visit: Admitting: Podiatry
